# Patient Record
Sex: Female | Born: 1990 | Race: Black or African American | Hispanic: No | Marital: Single | Smoking: Never smoker
Health system: Southern US, Community
[De-identification: ages and names within clinical notes are randomized; demographics above are authoritative.]

## PROBLEM LIST (undated history)

## (undated) ENCOUNTER — Inpatient Hospital Stay (HOSPITAL_COMMUNITY): Payer: Self-pay

## (undated) DIAGNOSIS — I10 Essential (primary) hypertension: Secondary | ICD-10-CM

## (undated) DIAGNOSIS — K819 Cholecystitis, unspecified: Secondary | ICD-10-CM

## (undated) DIAGNOSIS — J45909 Unspecified asthma, uncomplicated: Secondary | ICD-10-CM

## (undated) DIAGNOSIS — A749 Chlamydial infection, unspecified: Secondary | ICD-10-CM

## (undated) DIAGNOSIS — A6000 Herpesviral infection of urogenital system, unspecified: Secondary | ICD-10-CM

## (undated) HISTORY — PX: NO PAST SURGERIES: SHX2092

---
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
## 2012-07-07 ENCOUNTER — Emergency Department (HOSPITAL_COMMUNITY)
Admission: EM | Admit: 2012-07-07 | Discharge: 2012-07-07 | Disposition: A | Discharge status: Home / Self Care | Payer: Self-pay | Attending: Emergency Medicine | Admitting: Emergency Medicine

## 2012-07-07 ENCOUNTER — Encounter (HOSPITAL_COMMUNITY): Payer: Self-pay | Admitting: Emergency Medicine

## 2012-07-07 DIAGNOSIS — Z3202 Encounter for pregnancy test, result negative: Secondary | ICD-10-CM | POA: Insufficient documentation

## 2012-07-07 DIAGNOSIS — R109 Unspecified abdominal pain: Secondary | ICD-10-CM | POA: Insufficient documentation

## 2012-07-07 DIAGNOSIS — J45909 Unspecified asthma, uncomplicated: Secondary | ICD-10-CM | POA: Insufficient documentation

## 2012-07-07 DIAGNOSIS — R1031 Right lower quadrant pain: Secondary | ICD-10-CM

## 2012-07-07 DIAGNOSIS — Z8619 Personal history of other infectious and parasitic diseases: Secondary | ICD-10-CM | POA: Insufficient documentation

## 2012-07-07 HISTORY — DX: Unspecified asthma, uncomplicated: J45.909

## 2012-07-07 LAB — COMPREHENSIVE METABOLIC PANEL
ALT: 14 U/L (ref 0–35)
AST: 19 U/L (ref 0–37)
Albumin: 3.6 g/dL (ref 3.5–5.2)
CO2: 26 mEq/L (ref 19–32)
Calcium: 8.9 mg/dL (ref 8.4–10.5)
Sodium: 142 mEq/L (ref 135–145)
Total Protein: 6.8 g/dL (ref 6.0–8.3)

## 2012-07-07 LAB — CBC WITH DIFFERENTIAL/PLATELET
Basophils Absolute: 0 10*3/uL (ref 0.0–0.1)
Basophils Relative: 0 % (ref 0–1)
Eosinophils Relative: 0 % (ref 0–5)
Lymphocytes Relative: 27 % (ref 12–46)
MCV: 87.3 fL (ref 78.0–100.0)
Platelets: 150 10*3/uL (ref 150–400)
RDW: 12.8 % (ref 11.5–15.5)
WBC: 10.2 10*3/uL (ref 4.0–10.5)

## 2012-07-07 LAB — URINALYSIS, ROUTINE W REFLEX MICROSCOPIC
Hgb urine dipstick: NEGATIVE
Protein, ur: NEGATIVE mg/dL
Urobilinogen, UA: 1 mg/dL (ref 0.0–1.0)

## 2012-07-07 LAB — URINE MICROSCOPIC-ADD ON

## 2012-07-07 LAB — WET PREP, GENITAL

## 2012-07-07 NOTE — ED Provider Notes (Signed)
History     CSN: [id number]  Arrival date & time 07/07/12  7829   First MD Initiated Contact with Patient 07/07/12 0435      Chief Complaint  Patient presents with  . Abdominal Pain    (Consider location/radiation/quality/duration/timing/severity/associated sxs/prior treatment) HPI Comments: Patient is a 22 year old female who presents for lower abdominal cramping x2 weeks that is intermittent in nature and non-radiating. Patient denies area or alleviating factors without any specific events times with onset of this abdominal cramping. Patient denies fevers, chest pain, shortness of breath, nausea vomiting diarrhea, dysuria, hematuria, hematochezia, melena, vaginal discharge or bleeding, dyspareunia, and numbness or tingling in her extremities. Patient admits to testing positive and being treated for chlamydia in 04/2012. Patient states LMP was in December of 2013; admits to irregular menses.  The history is provided by the patient. No language interpreter was used.    Past Medical History  Diagnosis Date  . Asthma     History reviewed. No pertinent past surgical history.  No family history on file.  History  Substance Use Topics  . Smoking status: Never Smoker   . Smokeless tobacco: Not on file  . Alcohol Use: No    OB History   Grav Para Term Preterm Abortions TAB SAB Ect Mult Living                  Review of Systems  Constitutional: Negative for fever and chills.  HENT: Negative for neck pain and neck stiffness.   Respiratory: Negative for shortness of breath.   Cardiovascular: Negative for chest pain.  Gastrointestinal: Positive for abdominal pain. Negative for nausea, vomiting and diarrhea.  Genitourinary: Negative for dysuria, hematuria, vaginal bleeding, vaginal discharge and vaginal pain.  Neurological: Negative for dizziness, syncope, weakness, light-headedness and numbness.  All other systems reviewed and are negative.    Allergies  Review of patient's  allergies indicates no known allergies.  Home Medications  No current outpatient prescriptions on file.  BP 132/87  Pulse 77  Temp(Src) 98.1 F (36.7 C) (Oral)  Resp 16  SpO2 99%  LMP 04/12/2012  Physical Exam  Nursing note and vitals reviewed. Constitutional: She is oriented to person, place, and time. No distress.  Morbidly obese  HENT:  Head: Normocephalic and atraumatic.  Right Ear: External ear normal.  Left Ear: External ear normal.  Mouth/Throat: Oropharynx is clear and moist. No oropharyngeal exudate.  Eyes: Conjunctivae are normal. Pupils are equal, round, and reactive to light. No scleral icterus.  Cardiovascular: Normal rate, regular rhythm, normal heart sounds and intact distal pulses.   Pulmonary/Chest: Effort normal and breath sounds normal. No respiratory distress. She has no wheezes. She has no rales.  Abdominal: Soft. Bowel sounds are normal. She exhibits no distension. There is tenderness. There is no rebound.  Minimal tenderness on deep palpation that is generalized. No peritoneal signs.  Genitourinary: Vagina normal. There is no rash, tenderness, lesion or injury on the right labia. There is no rash, tenderness, lesion or injury on the left labia. Cervix exhibits friability. Cervix exhibits no motion tenderness. Right adnexum displays no mass, no tenderness and no fullness. Left adnexum displays no mass, no tenderness and no fullness. No erythema, tenderness or bleeding around the vagina.  Clear d/c appreciated from cervix. No CMT or adnexal tenderness.  Musculoskeletal: Normal range of motion.  Neurological: She is alert and oriented to person, place, and time.  Skin: Skin is warm and dry. No rash noted. She is not diaphoretic. No  erythema.  Psychiatric: She has a normal mood and affect. Her behavior is normal.    ED Course  Procedures (including critical care time)  Labs Reviewed  URINALYSIS, ROUTINE W REFLEX MICROSCOPIC - Abnormal; Notable for the  following:    Color, Urine AMBER (*)    APPearance CLOUDY (*)    Specific Gravity, Urine 1.034 (*)    Ketones, ur 15 (*)    Leukocytes, UA TRACE (*)    All other components within normal limits  COMPREHENSIVE METABOLIC PANEL - Abnormal; Notable for the following:    Glucose, Bld 102 (*)    All other components within normal limits  URINE MICROSCOPIC-ADD ON - Abnormal; Notable for the following:    Squamous Epithelial / LPF MANY (*)    Bacteria, UA MANY (*)    All other components within normal limits  URINE CULTURE  CBC WITH DIFFERENTIAL  POCT PREGNANCY, URINE   No results found.   1. B/l lower abdominal cramping    MDM  Patient presents for lower abdominal cramping x 2 weeks. No evidence of acute abdomen on physical exam. Basic labs unremarkable, urine pregnancy negative, and UA findings consistent with contamination; trace leukocytes without nitrites or urinary symptoms makes UTI unlikely at this time. Pelvic exam significant for cervical friability and clear d/c from the cervix without CMT or adnexal tenderness. GC/Chlamydia pending.   Pelvic U/S done at bedside with Dr. Rulon Abide which showed possible cysts on the L ovary. Patient advised to have complete pelvic U/S completed, but states she wants to leave because she has work in the AM. Patient well and nontoxic appearing with stable vital signs; stable for d/c with OBGYN follow up and scheduling of pelvic U/S as an outpatient. Resource guide provided and patient instructed to f/u regarding results of GC/Chlamydia test. Advised to refrain from sexual activity until results followed up on. Patient verbalizes comfort and understanding with this d/c plan with no unaddressed concerns. Patient work up and management discussed with Dr. Rulon Abide who is in agreement.        Antony Madura, PA-C 07/08/12 1218

## 2012-07-07 NOTE — ED Notes (Signed)
PT. REPORTS MID ABDOMINAL PAIN WITH NAUSEA FOR SEVERAL DAYS , DENIES VOMITTING OR DIARRHEA , NO FEVER OR CHILLS . DENIES VAGINAL DISCHARGE / DYSURIA.

## 2012-07-08 LAB — URINE CULTURE

## 2012-07-08 LAB — GC/CHLAMYDIA PROBE AMP: CT Probe RNA: POSITIVE — AB

## 2012-07-08 NOTE — ED Provider Notes (Signed)
Medical screening examination/treatment/procedure(s) were performed by non-physician practitioner and as supervising physician I was immediately available for consultation/collaboration.  Jones Skene, M.D.     Jones Skene, MD 07/08/12 1328

## 2012-07-09 ENCOUNTER — Telehealth (HOSPITAL_COMMUNITY): Payer: Self-pay | Admitting: Emergency Medicine

## 2012-07-09 NOTE — ED Notes (Signed)
Results received from [facility].  (+) Chlamydia.  No antibiotic treatment or Prescription given for STD.  Chart to MD office for review.  DHHS form attached. 

## 2012-07-11 ENCOUNTER — Telehealth (HOSPITAL_COMMUNITY): Payer: Self-pay | Admitting: Emergency Medicine

## 2012-07-11 ENCOUNTER — Ambulatory Visit (HOSPITAL_COMMUNITY): Payer: Self-pay

## 2012-07-11 NOTE — ED Notes (Signed)
Spoke with pt. Verified ID by SS#. Pt informed of lab results. Advised to notify partner for testing and treatment. Rolan Bucco PA ordered Azithromycin 1 gram po x 1 with no refills. This was called to [facility] on [address]. Left on voicemail.

## 2012-07-15 ENCOUNTER — Telehealth (HOSPITAL_COMMUNITY): Payer: Self-pay | Admitting: Emergency Medicine

## 2012-07-15 NOTE — ED Notes (Signed)
Pt at [facility] for tx.  [facility] did not receive DHHS form for pt.  Will complete DHHS form and fax to [facility]

## 2013-01-05 ENCOUNTER — Emergency Department (HOSPITAL_COMMUNITY)
Admission: EM | Admit: 2013-01-05 | Discharge: 2013-01-05 | Disposition: A | Discharge status: Home / Self Care | Payer: Self-pay | Attending: Emergency Medicine | Admitting: Emergency Medicine

## 2013-01-05 ENCOUNTER — Encounter (HOSPITAL_COMMUNITY): Payer: Self-pay | Admitting: Emergency Medicine

## 2013-01-05 DIAGNOSIS — M549 Dorsalgia, unspecified: Secondary | ICD-10-CM

## 2013-01-05 DIAGNOSIS — Z3202 Encounter for pregnancy test, result negative: Secondary | ICD-10-CM | POA: Insufficient documentation

## 2013-01-05 DIAGNOSIS — IMO0001 Reserved for inherently not codable concepts without codable children: Secondary | ICD-10-CM | POA: Insufficient documentation

## 2013-01-05 DIAGNOSIS — M545 Low back pain, unspecified: Secondary | ICD-10-CM | POA: Insufficient documentation

## 2013-01-05 DIAGNOSIS — J45909 Unspecified asthma, uncomplicated: Secondary | ICD-10-CM | POA: Insufficient documentation

## 2013-01-05 LAB — URINALYSIS, ROUTINE W REFLEX MICROSCOPIC
Ketones, ur: NEGATIVE mg/dL
Protein, ur: NEGATIVE mg/dL
Urobilinogen, UA: 1 mg/dL (ref 0.0–1.0)

## 2013-01-05 LAB — PREGNANCY, URINE: Preg Test, Ur: NEGATIVE

## 2013-01-05 MED ORDER — ACETAMINOPHEN 325 MG PO TABS
650.0000 mg | ORAL_TABLET | Freq: Once | ORAL | Status: AC
  Administered 2013-01-05: 650 mg via ORAL
  Filled 2013-01-05: qty 2

## 2013-01-05 MED ORDER — IBUPROFEN 400 MG PO TABS
600.0000 mg | ORAL_TABLET | Freq: Once | ORAL | Status: AC
  Administered 2013-01-05: 600 mg via ORAL
  Filled 2013-01-05 (×2): qty 1

## 2013-01-05 MED ORDER — CYCLOBENZAPRINE HCL 5 MG PO TABS: 5.0000 mg | ORAL_TABLET | Freq: Three times a day (TID) | ORAL | Status: DC | PRN

## 2013-01-05 NOTE — ED Provider Notes (Signed)
CSN: [id number]     Arrival date & time 01/05/13  1531 History  This chart was scribed for non-physician practitioner, Coral Ceo, PA-C working with No att. providers found by Greggory Stallion, ED scribe. This patient was seen in room TR09C/TR09C and the patient's care was started at 4:53 PM.   Chief Complaint  Patient presents with  . Back Pain   The history is provided by the patient. No language interpreter was used.    HPI Comments: Bailey Carney is a 22 y.o. female who presents to the Emergency Department complaining of mid to lower back pain that radiates to her upper left leg that started this morning after she woke up. Pt denies injury or fall. Pt states she was just packing things into boxes yesterday. She states she has never injured or had surgeries on her back before. Certain movements worsen the pain. Pt denies knee pain, calf pain, fever, chills, rhinorrhea, sore throat, cough, SOB, CP, abdominal pain, nausea, emesis, dysuria, vaginal discharge, vaginal bleeding, numbness, tinging, weakness, bowel and bladder incontinence. LNMP was one week ago.  Past Medical History  Diagnosis Date  . Asthma    History reviewed. No pertinent past surgical history. No family history on file. History  Substance Use Topics  . Smoking status: Never Smoker   . Smokeless tobacco: Not on file  . Alcohol Use: No   OB History   Grav Para Term Preterm Abortions TAB SAB Ect Mult Living                 Review of Systems  Constitutional: Negative for fever, chills, diaphoresis, activity change, appetite change and fatigue.  HENT: Negative for ear pain, congestion, sore throat, rhinorrhea, neck pain and neck stiffness.   Respiratory: Negative for cough and shortness of breath.   Cardiovascular: Negative for chest pain.  Gastrointestinal: Negative for nausea, vomiting and abdominal pain.  Genitourinary: Negative for dysuria, hematuria, vaginal bleeding and vaginal discharge.       Denies bowel  and bladder incontinence.   Musculoskeletal: Positive for myalgias and back pain. Negative for joint swelling, arthralgias and gait problem.  Skin: Negative for wound.  Neurological: Negative for weakness, numbness and headaches.  Psychiatric/Behavioral: Negative for confusion.  All other systems reviewed and are negative.    Allergies  Review of patient's allergies indicates no known allergies.  Home Medications  No current outpatient prescriptions on file.  BP 135/66  Pulse 72  Temp(Src) 98.1 F (36.7 C) (Oral)  Resp 18  Ht 5\' 7"  (1.702 m)  SpO2 100%  LMP 12/29/2012  Filed Vitals:   01/05/13 1539 01/05/13 1823  BP: 135/66 126/88  Pulse: 72 59  Temp: 98.1 F (36.7 C) 97 F (36.1 C)  TempSrc: Oral   Resp: 18   Height: 5\' 7"  (1.702 m)   SpO2: 100% 100%    Physical Exam  Nursing note and vitals reviewed. Constitutional: She is oriented to person, place, and time. She appears well-developed and well-nourished. No distress.  HENT:  Head: Normocephalic and atraumatic.  Right Ear: External ear normal.  Left Ear: External ear normal.  Nose: Nose normal.  Eyes: Conjunctivae and EOM are normal. Right eye exhibits no discharge. Left eye exhibits no discharge.  Neck: Normal range of motion. Neck supple. No tracheal deviation present.  No cervical spinal tenderness.    Cardiovascular: Normal rate, regular rhythm and normal heart sounds.   Pulmonary/Chest: Effort normal and breath sounds normal. No respiratory distress. She has no wheezes.  She has no rales.  Abdominal: Soft. Bowel sounds are normal. There is no tenderness.  Musculoskeletal: Normal range of motion. She exhibits tenderness. She exhibits no edema.  No swelling, erythema, ecchymosis or wounds to lower extremities throughout. No tenderness to palpation to lower extremities throughout. Good flexion to knees bilaterally. Gross sensation intact to lower extremities. Tenderness to palpation to lower lumbar and para  spinal muscles. No bony tenderness.   Neurological: She is alert and oriented to person, place, and time.  Gross sensation intact throughou  Skin: Skin is warm and dry. She is not diaphoretic.  Psychiatric: She has a normal mood and affect. Her behavior is normal.    ED Course  Procedures (including critical care time)  DIAGNOSTIC STUDIES: Oxygen Saturation is 100% on RA, normal by my interpretation.    COORDINATION OF CARE: 5:02 PM-Discussed treatment plan which includes UA with pt at bedside and pt agreed to plan.   6:13 PM-Pt was resting comfortably and walking around the room. She was able to get on and off the table comfortably. Discussed discharge with her and she agrees.   Labs Review Labs Reviewed  URINALYSIS, ROUTINE W REFLEX MICROSCOPIC - Abnormal; Notable for the following:    Leukocytes, UA TRACE (*)    All other components within normal limits  URINE MICROSCOPIC-ADD ON - Abnormal; Notable for the following:    Squamous Epithelial / LPF FEW (*)    Bacteria, UA FEW (*)    All other components within normal limits  PREGNANCY, URINE   Imaging Review No results found.  Results for orders placed during the hospital encounter of 01/05/13  URINALYSIS, ROUTINE W REFLEX MICROSCOPIC      Result Value Range   Color, Urine YELLOW  YELLOW   APPearance CLEAR  CLEAR   Specific Gravity, Urine 1.019  1.005 - 1.030   pH 7.5  5.0 - 8.0   Glucose, UA NEGATIVE  NEGATIVE mg/dL   Hgb urine dipstick NEGATIVE  NEGATIVE   Bilirubin Urine NEGATIVE  NEGATIVE   Ketones, ur NEGATIVE  NEGATIVE mg/dL   Protein, ur NEGATIVE  NEGATIVE mg/dL   Urobilinogen, UA 1.0  0.0 - 1.0 mg/dL   Nitrite NEGATIVE  NEGATIVE   Leukocytes, UA TRACE (*) NEGATIVE  PREGNANCY, URINE      Result Value Range   Preg Test, Ur NEGATIVE  NEGATIVE  URINE MICROSCOPIC-ADD ON      Result Value Range   Squamous Epithelial / LPF FEW (*) RARE   WBC, UA 0-2  <3 WBC/hpf   Bacteria, UA FEW (*) RARE     MDM  No  diagnosis found.   Bailey Carney is a 22 y.o. female Bailey Carney is a 22 y.o. female who presents to the Emergency Department complaining of mid to lower back pain.  UA and urine pregnancy ordered to further evaluate.  Ibuprofen and Tylenol ordered for symptomatic relief.      Etiology of back pain is likely due to a muscular strain.  UA not indicative for UTI.  No hx of trauma or injury.  Patint was neurovascularly intact.  Patient was prescribed flexeril for outpatient management.  Patient was instructed to return to the ED if they experience any loss of bowel/bladder, weakness, fever or other concerns.  Patient instructed to follow-up with a PCP for further evaluation and management.  Patient was in agreement with discharge and plan.     Final impressions: 1. Back pain     Greer Ee  Isabelle Course   I personally performed the services described in this documentation, which was scribed in my presence. The recorded information has been reviewed and is accurate.     Jillyn Ledger, PA-C 01/06/13 2217

## 2013-01-05 NOTE — ED Notes (Signed)
Pt. woke up this morning with mid/low back pain radiating to left leg , denies injury or fall . Ambulatory . Denies urinary discomfort .

## 2013-01-05 NOTE — ED Notes (Signed)
I gave the patient a warm blanket. 

## 2013-01-08 NOTE — ED Provider Notes (Signed)
Medical screening examination/treatment/procedure(s) were performed by non-physician practitioner and as supervising physician I was immediately available for consultation/collaboration.  Swan Zayed, MD 01/08/13 0634 

## 2013-08-03 ENCOUNTER — Emergency Department (HOSPITAL_COMMUNITY)
Admission: EM | Admit: 2013-08-03 | Discharge: 2013-08-03 | Disposition: A | Discharge status: Home / Self Care | Payer: BC Managed Care – PPO | Attending: Emergency Medicine | Admitting: Emergency Medicine

## 2013-08-03 ENCOUNTER — Encounter (HOSPITAL_COMMUNITY): Payer: Self-pay | Admitting: Emergency Medicine

## 2013-08-03 ENCOUNTER — Emergency Department (HOSPITAL_COMMUNITY): Payer: BC Managed Care – PPO

## 2013-08-03 DIAGNOSIS — R52 Pain, unspecified: Secondary | ICD-10-CM | POA: Insufficient documentation

## 2013-08-03 DIAGNOSIS — J069 Acute upper respiratory infection, unspecified: Secondary | ICD-10-CM

## 2013-08-03 DIAGNOSIS — R0789 Other chest pain: Secondary | ICD-10-CM | POA: Insufficient documentation

## 2013-08-03 DIAGNOSIS — R51 Headache: Secondary | ICD-10-CM | POA: Insufficient documentation

## 2013-08-03 DIAGNOSIS — J45901 Unspecified asthma with (acute) exacerbation: Secondary | ICD-10-CM | POA: Insufficient documentation

## 2013-08-03 DIAGNOSIS — B9789 Other viral agents as the cause of diseases classified elsewhere: Secondary | ICD-10-CM

## 2013-08-03 MED ORDER — CETIRIZINE HCL 10 MG PO TABS: 10.0000 mg | ORAL_TABLET | Freq: Every day | ORAL | Status: DC

## 2013-08-03 MED ORDER — ALBUTEROL SULFATE HFA 108 (90 BASE) MCG/ACT IN AERS
2.0000 | INHALATION_SPRAY | Freq: Once | RESPIRATORY_TRACT | Status: AC
  Administered 2013-08-03: 2 via RESPIRATORY_TRACT
  Filled 2013-08-03: qty 6.7

## 2013-08-03 MED ORDER — GUAIFENESIN ER 600 MG PO TB12: 1200.0000 mg | ORAL_TABLET | Freq: Two times a day (BID) | ORAL | Status: DC

## 2013-08-03 MED ORDER — AEROCHAMBER PLUS W/MASK MISC
1.0000 | Freq: Once | Status: AC
  Administered 2013-08-03: 1
  Filled 2013-08-03 (×3): qty 1

## 2013-08-03 MED ORDER — FLUTICASONE PROPIONATE 50 MCG/ACT NA SUSP: 2.0000 | Freq: Every day | NASAL | Status: DC

## 2013-08-03 MED ORDER — PREDNISONE 20 MG PO TABS
60.0000 mg | ORAL_TABLET | Freq: Once | ORAL | Status: AC
  Administered 2013-08-03: 60 mg via ORAL
  Filled 2013-08-03: qty 3

## 2013-08-03 MED ORDER — ALBUTEROL SULFATE (2.5 MG/3ML) 0.083% IN NEBU
5.0000 mg | INHALATION_SOLUTION | Freq: Once | RESPIRATORY_TRACT | Status: AC
  Administered 2013-08-03: 5 mg via RESPIRATORY_TRACT
  Filled 2013-08-03: qty 6

## 2013-08-03 MED ORDER — PREDNISONE 20 MG PO TABS: 40.0000 mg | ORAL_TABLET | Freq: Every day | ORAL | Status: DC

## 2013-08-03 NOTE — Discharge Instructions (Signed)
1. Medications: flonase, mucinex, albuterol, prednisone, zyrtec, usual home medications 2. Treatment: rest, drink plenty of fluids, take tylenol or ibuprofen for fever control 3. Follow Up: Please followup with your primary doctor within 1 week for discussion of your diagnoses and further evaluation after today's visit; if you do not have a primary care doctor use the resource guide provided to find one;    Upper Respiratory Infection, Adult An upper respiratory infection (URI) is also known as the common cold. It is often caused by a type of germ (virus). Colds are easily spread (contagious). You can pass it to others by kissing, coughing, sneezing, or drinking out of the same glass. Usually, you get better in 1 or 2 weeks.  HOME CARE   Only take medicine as told by your doctor.  Use a warm mist humidifier or breathe in steam from a hot shower.  Drink enough water and fluids to keep your pee (urine) clear or pale yellow.  Get plenty of rest.  Return to work when your temperature is back to normal or as told by your doctor. You may use a face mask and wash your hands to stop your cold from spreading. GET HELP RIGHT AWAY IF:   After the first few days, you feel you are getting worse.  You have questions about your medicine.  You have chills, shortness of breath, or brown or red spit (mucus).  You have yellow or brown snot (nasal discharge) or pain in the face, especially when you bend forward.  You have a fever, puffy (swollen) neck, pain when you swallow, or white spots in the back of your throat.  You have a bad headache, ear pain, sinus pain, or chest pain.  You have a high-pitched whistling sound when you breathe in and out (wheezing).  You have a lasting cough or cough up blood.  You have sore muscles or a stiff neck. MAKE SURE YOU:   Understand these instructions.  Will watch your condition.  Will get help right away if you are not doing well or get worse. Document  Released: 09/19/2007 Document Revised: 06/25/2011 Document Reviewed: 08/07/2010 [facility] Patient Information 2014 [place], [place].   Asthma, Adult Asthma is a recurring condition in which the airways tighten and narrow. Asthma can make it difficult to breathe. It can cause coughing, wheezing, and shortness of breath. Asthma episodes (also called asthma attacks) range from minor to life-threatening. Asthma cannot be cured, but medicines and lifestyle changes can help control it. CAUSES Asthma is believed to be caused by inherited (genetic) and environmental factors, but its exact cause is unknown. Asthma may be triggered by allergens, lung infections, or irritants in the air. Asthma triggers are different for each person. Common triggers include:   Animal dander.  Dust mites.  Cockroaches.  Pollen from trees or grass.  Mold.  Smoke.  Air pollutants such as dust, household cleaners, hair sprays, aerosol sprays, paint fumes, strong chemicals, or strong odors.  Cold air, weather changes, and winds (which increase molds and pollens in the air).  Strong emotional expressions such as crying or laughing hard.  Stress.  Certain medicines (such as aspirin) or types of drugs (such as beta-blockers).  Sulfites in foods and drinks. Foods and drinks that may contain sulfites include dried fruit, potato chips, and sparkling grape juice.  Infections or inflammatory conditions such as the flu, a cold, or an inflammation of the nasal membranes (rhinitis).  Gastroesophageal reflux disease (GERD).  Exercise or strenuous activity. SYMPTOMS  Symptoms may occur immediately after asthma is triggered or many hours later. Symptoms include:  Wheezing.  Excessive nighttime or early morning coughing.  Frequent or severe coughing with a common cold.  Chest tightness.  Shortness of breath. DIAGNOSIS  The diagnosis of asthma is made by a review of your medical history and a physical exam. Tests may  also be performed. These may include:  Lung function studies. These tests show how much air you breath in and out.  Allergy tests.  Imaging tests such as X-rays. TREATMENT  Asthma cannot be cured, but it can usually be controlled. Treatment involves identifying and avoiding your asthma triggers. It also involves medicines. There are 2 classes of medicine used for asthma treatment:   Controller medicines. These prevent asthma symptoms from occurring. They are usually taken every day.  Reliever or rescue medicines. These quickly relieve asthma symptoms. They are used as needed and provide short-term relief. Your health care provider will help you create an asthma action plan. An asthma action plan is a written plan for managing and treating your asthma attacks. It includes a list of your asthma triggers and how they may be avoided. It also includes information on when medicines should be taken and when their dosage should be changed. An action plan may also involve the use of a device called a peak flow meter. A peak flow meter measures how well the lungs are working. It helps you monitor your condition. HOME CARE INSTRUCTIONS   Take medicine as directed by your health care provider. Speak with your health care provider if you have questions about how or when to take the medicines.  Use a peak flow meter as directed by your health care provider. Record and keep track of readings.  Understand and use the action plan to help minimize or stop an asthma attack without needing to seek medical care.  Control your home environment in the following ways to help prevent asthma attacks:  Do not smoke. Avoid being exposed to secondhand smoke.  Change your heating and air conditioning filter regularly.  Limit your use of fireplaces and wood stoves.  Get rid of pests (such as roaches and mice) and their droppings.  Throw away plants if you see mold on them.  Clean your floors and dust regularly.  Use unscented cleaning products.  Try to have someone else vacuum for you regularly. Stay out of rooms while they are being vacuumed and for a short while afterward. If you vacuum, use a dust mask from a hardware store, a double-layered or microfilter vacuum cleaner bag, or a vacuum cleaner with a HEPA filter.  Replace carpet with wood, tile, or vinyl flooring. Carpet can trap dander and dust.  Use allergy-proof pillows, mattress covers, and box spring covers.  Wash bed sheets and blankets every week in hot water and dry them in a dryer.  Use blankets that are made of polyester or cotton.  Clean bathrooms and kitchens with bleach. If possible, have someone repaint the walls in these rooms with mold-resistant paint. Keep out of the rooms that are being cleaned and painted.  Wash hands frequently. SEEK MEDICAL CARE IF:   You have wheezing, shortness of breath, or a cough even if taking medicine to prevent attacks.  The colored mucus you cough up (sputum) is thicker than usual.  Your sputum changes from clear or white to yellow, green, gray, or bloody.  You have any problems that may be related to the medicines you are  taking (such as a rash, itching, swelling, or trouble breathing).  You are using a reliever medicine more than 2 3 times per week.  Your peak flow is still at 50 79% of you personal best after following your action plan for 1 hour. SEEK IMMEDIATE MEDICAL CARE IF:   You seem to be getting worse and are unresponsive to treatment during an asthma attack.  You are short of breath even at rest.  You get short of breath when doing very little physical activity.  You have difficulty eating, drinking, or talking due to asthma symptoms.  You develop chest pain.  You develop a fast heartbeat.  You have a bluish color to your lips or fingernails.  You are lightheaded, dizzy, or faint.  Your peak flow is less than 50% of your personal best.  You have a fever or  persistent symptoms for more than 2 3 days.  You have a fever and symptoms suddenly get worse. MAKE SURE YOU:   Understand these instructions.  Will watch your condition.  Will get help right away if you are not doing well or get worse. Document Released: 04/02/2005 Document Revised: 12/03/2012 Document Reviewed: 10/30/2012 [facility] Patient Information 2014 [place], [place].    Emergency Department Resource Guide 1) Find a Doctor and Pay Out of Pocket Although you won't have to find out who is covered by your insurance plan, it is a good idea to ask around and get recommendations. You will then need to call the office and see if the doctor you have chosen will accept you as a new patient and what types of options they offer for patients who are self-pay. Some doctors offer discounts or will set up payment plans for their patients who do not have insurance, but you will need to ask so you aren't surprised when you get to your appointment.  2) Contact Your Local Health Department Not all health departments have doctors that can see patients for sick visits, but many do, so it is worth a call to see if yours does. If you don't know where your local health department is, you can check in your phone book. The CDC also has a tool to help you locate your state's health department, and many state websites also have listings of all of their local health departments.  3) Find a Walk-in Clinic If your illness is not likely to be very severe or complicated, you may want to try a walk in clinic. These are popping up all over the country in pharmacies, drugstores, and shopping centers. They're usually staffed by nurse practitioners or physician assistants that have been trained to treat common illnesses and complaints. They're usually fairly quick and inexpensive. However, if you have serious medical issues or chronic medical problems, these are probably not your best option.  No Primary Care  Doctor: - Call Health Connect at  [phone] - they can help you locate a primary care doctor that  accepts your insurance, provides certain services, etc. - Physician Referral Service- [phone]  Chronic Pain Problems: Organization         Address  Phone   Notes  [facility]  [phone] Patients need to be referred by their primary care doctor.   Medication Assistance: Organization         Address  Phone   Notes  [facility] Medication [facility] [address] [place] [place], [place] [address] [phone] --Must be a resident of [facility] --  Must have NO insurance coverage whatsoever (no Medicaid/ Medicare, etc.) -- The pt. MUST have a primary care doctor that directs their care regularly and follows them in the community   [facility]  [phone]   [employer]  [phone]    Agencies that provide inexpensive medical care: Organization         Address  Phone   Notes  [facility]  [phone]   [facility]    [phone]   [facility] [address] [place], [place] [address] [phone]   [facility] [address] [place], [place] [phone]   Planned Parenthood    [phone]   [facility]    [phone]   [facility]  [address], [place] Phone:  [phone], Fax:  [phone] Hours of Operation:  9 am - 6 pm, M-F.  Also accepts Medicaid/Medicare and self-pay.  [facility]  [address], [place] Phone: [phone], Fax: [phone]. Hours of Operation:  8:30 am - 5:30 pm, M-F.  Also accepts Medicaid and self-pay.  [facility] [place] [address] [place] Phone: [phone]   [facility] [address] Natasha Bence [place], [place] [phone], Ext. [phone] Mondays & Thursdays: 7-9 AM.  First 15 patients are seen on a first  come, first serve basis.    Medicaid-accepting [facility] Providers:  Organization         Address  Phone   Notes  [facility] [address], [place] [phone] Also accepts self-pay patients.  [facility] [address] Laurell Josephs [place], [place]  [phone]   [facility] [address], [place] [phone]   [facility] [address], [place] [phone]   Renaye Rakers [address], [place]   [phone] Only accepts [place] patients after they have their name applied to their card.   Self-Pay (no insurance) in [facility]:  Organization         Address  Phone   Notes  Sickle Cell Patients, [facility] [address] [place], [place] [phone]   [facility] [address] [place], [place] [phone]   [facility] [place]  [address], [place] [phone]   [facility]/Dr. Osei-Bonsu  [address], [place] or [address], [place] [phone] Phone number for both [place] and [place] locations is the same.  [facility] [place] [address], [place] [phone]   [facility] [address] [place] or [address] [phone]   [facility] [address] [place] [phone], phone; [phone], fax Sees patients 1st and 3rd Saturday of every month.  Must not qualify for public or private insurance (i.e. Medicaid, Medicare, [place] Health Choice, Veterans' Benefits)  Household income should be no more than 200% of the poverty level The clinic cannot treat you if you are pregnant or think you are pregnant  Sexually transmitted diseases are not treated at the clinic.    Dental Care: Organization          Address  Phone  Notes  [facility] of [facility] [address] [place], [place] [phone] Accepts children up to age 6 who are enrolled in [place] or [place] Health Choice; pregnant women with a Medicaid card; and children who have applied for Medicaid or [place] Health Choice, but were declined, whose parents can pay a reduced fee at time of service.  [facility] Department of [facility]  [address], [place] [phone] Accepts children up to age 69 who are enrolled in [place] or  Health Choice; pregnant women with a Medicaid card; and children who have applied for Medicaid or  Health Choice, but were declined, whose parents can pay a reduced fee at time of service.  [facility]  [address] [place], [place] [phone] Patients are seen by appointment only. Walk-ins are not accepted. [facility] will see patients 97 years of age and older. Monday - Tuesday (8am-5pm) Most Wednesdays (8:30-5pm) $30 per visit, cash only  [facility] Adult Dental Access PRO[facility]  [address], [facility] [phone] Patients are seen by appointment only. Walk-ins are not accepted. [facility] will see patients 82 years of age and older. One Wednesday Evening (Monthly: Volunteer Based).  $30 per visit, cash only  [employer]  [phone] for adults; Children under age 31, call Graduate Pediatric Dentistry at [phone]. Children aged 41-14, please call [phone] to request a pediatric application.  Dental services are provided in all areas of dental care including fillings, crowns and bridges, complete and partial dentures, implants, gum treatment, root canals, and extractions. Preventive care is also provided. Treatment is provided to both adults and children. Patients are selected via a lottery and there is often a waiting list.   [facility] [address] [place]  [phone] [email or web address]   [facility] [address] [place], [place] [phone], Ext. [phone] Second and Fourth Thursday of each month, opens at 6:30 AM; Clinic ends at 9 AM.  Patients are seen on a first-come first-served basis, and a limited number are seen during each clinic.   [facility]  [address] Ether Griffins [place], [place] [phone]   Eligibility Requirements You must have lived in [place], [place], or [place] counties for at least the last three months.   You cannot be eligible for state or federal sponsored [place], including [employer], [place], or [employer].   You generally cannot be eligible for healthcare insurance through your employer.    How to apply: Eligibility screenings are held every Tuesday and Wednesday afternoon from 1:00 pm until 4:00 pm. You do not need an appointment for the interview!  [facility] [address], [place], [place] [phone]   [facility] Health Department  [phone]   [facility] Health Department  [phone]   [facility] Health Department  [phone]    Behavioral Health Resources in the Community: Intensive Outpatient Programs Organization         Address  Phone  Notes  [facility] [address] [place], [place] [phone]   [facility] Outpatient [address], [place], [place] [phone]   [facility] [address] [place], [place]  [phone]   [facility] [address],  [place], [place] [phone] or [phone]   Substance Abuse Resources Organization  Address  Phone  Notes  [facility]  [phone]   [facility]  [phone]   [facility]  [phone]   [facility]  [phone]   Residential & Outpatient Substance Abuse Program  [phone]   Psychological  Services Organization         Address  Phone  Notes  [facility]  [phone]- [phone]   [facility]  [phone]- [phone]   [facility] [address], [place] [phone] or [phone]    Mobile Crisis Teams Organization         Address  Phone  Notes  [facility], Mobile Crisis Care Unit  [phone]   [facility]  [address] [place], [place] [phone]   Doristine LocksSharon DeEsch [address] [place] [phone]    Self-Help/Support Groups Organization         Address  Phone             Notes  [facility] [place] - variety of support groups  336- [phone] Call for more information  Narcotics Anonymous (NA), [facility] [address], [place]  2 meetings at this location   Statisticianesidential Treatment Programs Organization         Address  Phone  Notes  [facility] [address],    [place]  [phone]   [facility]  [address], [place] [phone], [place], [place] [phone]   [facility] [address], [place] [phone] Admissions: 8am-3pm M-F  [facility] [address]    [place], [place] [phone]   [facility] [address], [place], [place] [phone]   [facility] [address]  [place], [place] [phone]   [facility] - Intensive Outpatient [address], [place], [place] [phone]   [facility] ([facility] [address]  [place], [place] [phone] or [phone]   [facility] ([facility]) [address] [place], [place] [phone] Accepts Medicaid  [facility] [address]  [place] [phone] Substance Abuse/Addiction Treatment   [facility] Resources Organization         Address  Phone  Notes  [facility]  [phone])  [phone]   Angie FavaJulie Brannon, PhD [address], [address] [place], [place]   [phone] or [phone]) [phone]   [facility]   [address] [place], [place] [phone](336) [phone]   [facility] [address], [place], [place] [phone]) [phone] Insurance/Medicaid/sponsorship through [facility]  [facility] Families [address]                                    [place], [place] [phone]) [phone] Therapy/tele-psych/case  [facility] [address].   [place], [place] [phone]    Dr. Lolly MustacheArfeen  [phone]   [facility]  [employer] [facility] 1) [address], [place] 2) [address], [place] 3)  [address], [place] [phone]) [phone]) [phone]  [phone]) [phone]   [facility] Child Abuse Hotline [phone]) [phone] or [phone] (After Hours)

## 2013-08-03 NOTE — ED Provider Notes (Signed)
CSN: [id number]     Arrival date & time 08/03/13  1555 History   First MD Initiated Contact with Patient 08/03/13 1647     Chief Complaint  Patient presents with  . Generalized Body Aches  . Nasal Congestion     (Consider location/radiation/quality/duration/timing/severity/associated sxs/prior Treatment) The history is provided by the patient and medical records. No language interpreter was used.    Bailey Carney is a 23 y.o. female  with a hx of asthma presents to the Emergency Department complaining of gradual, persistent, progressively worsening rhinorrhea, nasal congestion, sore throat, cough, SOB, sneezing, low grade fever (measured at < 100 at home) onset 3 days ago.   Pt also reports associated headache, generalized and throbbing without visual disturbances.  Pt reports she has bee around her nieces who are sick.  Pt has taken mucinex x1 without relief. She reports she has no albuterol inhaler because she lost it.  Pt has no PCP or pulmonologist to manage her asthma.  Pt denies hx of diabetes.  No aggravating or alleviating symptoms.  Pt denies chills, neck pain, chest pain, abd pain, N/V/D, weakness, dizziness, syncope, dysuria.      Past Medical History  Diagnosis Date  . Asthma    History reviewed. No pertinent past surgical history. History reviewed. No pertinent family history. History  Substance Use Topics  . Smoking status: Never Smoker   . Smokeless tobacco: Not on file  . Alcohol Use: No   OB History   Grav Para Term Preterm Abortions TAB SAB Ect Mult Living                 Review of Systems  Constitutional: Positive for fatigue. Negative for fever, chills and appetite change.  HENT: Positive for congestion, postnasal drip, rhinorrhea, sinus pressure and sore throat. Negative for ear discharge, ear pain and mouth sores.   Eyes: Negative for visual disturbance.  Respiratory: Positive for cough, chest tightness, shortness of breath and wheezing. Negative for  stridor.   Cardiovascular: Negative for chest pain, palpitations and leg swelling.  Gastrointestinal: Negative for nausea, vomiting, abdominal pain and diarrhea.  Genitourinary: Negative for dysuria, urgency, frequency and hematuria.  Musculoskeletal: Negative for arthralgias, back pain, myalgias and neck stiffness.  Skin: Negative for rash.  Neurological: Positive for headaches. Negative for syncope, light-headedness and numbness.  Hematological: Negative for adenopathy.  Psychiatric/Behavioral: The patient is not nervous/anxious.   All other systems reviewed and are negative.     Allergies  Amoxicillin  Home Medications   Prior to Admission medications   Not on File   BP 131/65  Pulse 72  Temp(Src) 99.4 F (37.4 C) (Oral)  Resp 21  Ht 5\' 7"  (1.702 m)  Wt 245 lb (111.131 kg)  BMI 38.36 kg/m2  SpO2 92%  LMP 07/28/2013 Physical Exam  Nursing note and vitals reviewed. Constitutional: She is oriented to person, place, and time. She appears well-developed and well-nourished. No distress.  Awake, alert, nontoxic appearance  HENT:  Head: Normocephalic and atraumatic.  Right Ear: Tympanic membrane, external ear and ear canal normal.  Left Ear: Tympanic membrane, external ear and ear canal normal.  Nose: Mucosal edema and rhinorrhea present. No epistaxis. Right sinus exhibits no maxillary sinus tenderness and no frontal sinus tenderness. Left sinus exhibits no maxillary sinus tenderness and no frontal sinus tenderness.  Mouth/Throat: Uvula is midline, oropharynx is clear and moist and mucous membranes are normal. Mucous membranes are not pale and not cyanotic. No oropharyngeal exudate, posterior oropharyngeal  edema, posterior oropharyngeal erythema or tonsillar abscesses.  Eyes: Conjunctivae are normal. Pupils are equal, round, and reactive to light. No scleral icterus.  Neck: Normal range of motion and full passive range of motion without pain. Neck supple.  Cardiovascular:  Normal rate, regular rhythm, normal heart sounds and intact distal pulses.   Regular rate and rhythm, no tachycardia  Pulmonary/Chest: Effort normal. No accessory muscle usage or stridor. Not tachypneic. No respiratory distress. She has decreased breath sounds. She has no wheezes. She has no rhonchi. She has no rales.  Diminished but clear and equal breath sounds  Abdominal: Soft. Bowel sounds are normal. She exhibits no mass. There is no tenderness. There is no rebound and no guarding.  Musculoskeletal: Normal range of motion. She exhibits no edema.  Lymphadenopathy:    She has no cervical adenopathy.  Neurological: She is alert and oriented to person, place, and time.  Speech is clear and goal oriented Moves extremities without ataxia  Skin: Skin is warm and dry. No rash noted. She is not diaphoretic.  Psychiatric: She has a normal mood and affect.    ED Course  Procedures (including critical care time) Labs Review Labs Reviewed - No data to display  Imaging Review Dg Chest 2 View  08/03/2013   CLINICAL DATA:  Cough, nasal congestion and body aches  EXAM: CHEST  2 VIEW  COMPARISON:  None.  FINDINGS: The lungs are clear and negative for focal airspace consolidation, pulmonary edema or suspicious pulmonary nodule. No pleural effusion or pneumothorax. Cardiac and mediastinal contours are within normal limits. No acute fracture or lytic or blastic osseous lesions. The visualized upper abdominal bowel gas pattern is unremarkable.  IMPRESSION: No active cardiopulmonary disease.   Electronically Signed   By: Malachy MoanHeath  McCullough M.D.   On: 08/03/2013 17:15     EKG Interpretation None      MDM   Final diagnoses:  Viral URI with cough  Asthma exacerbation   [name] presents with HX and PE consistent with URI.  Pt also with hx of asthma and has lost her albuterol inhaler.  Decreased breath sounds on exam without focal wheezes, rhonchi or rales.  CXR without evidence of PNA,  pneumothorax of pulmonary edema.  Will give albuterol and prednisone and reassess.    I personally reviewed the imaging tests through PACS system  I reviewed available ER/hospitalization records through the EMR  6:18 PM Pt tidal volume significantly improved after albuterol and prednisone. Pt now with clear and equal breath sounds.  Patient ambulated in ED with O2 saturations maintained >90, no current signs of respiratory distress. Lung exam improved after nebulizer treatment. Prednisone given in the ED and pt will be dc with 5 day burst. Pt states they are breathing at baseline. Pt has been instructed to continue using prescribed medications and obtain a PCP for follow-up in 1 week for re-evaluation after today's exacerbation. Pt is to return to the ED for worsening SOB no alleviated by her inhaler which was provided today in the ED.    It has been determined that no acute conditions requiring further emergency intervention are present at this time. The patient/guardian have been advised of the diagnosis and plan. We have discussed signs and symptoms that warrant return to the ED, such as changes or worsening in symptoms.   Vital signs are stable at discharge.   BP 131/65  Pulse 72  Temp(Src) 99.4 F (37.4 C) (Oral)  Resp 21  Ht 5\' 7"  (1.702  m)  Wt 245 lb (111.131 kg)  BMI 38.36 kg/m2  SpO2 92%  LMP 07/28/2013  Patient/guardian has voiced understanding and agreed to follow-up with the PCP or specialist.       Dierdre ForthHannah Koty Anctil, PA-C 08/04/13 0230

## 2013-08-03 NOTE — ED Notes (Signed)
Pt with low grade fever, congestion and body aches since Friday.  Pt took mucinex last night.  No meds today.

## 2013-08-04 NOTE — ED Provider Notes (Signed)
Medical screening examination/treatment/procedure(s) were performed by non-physician practitioner and as supervising physician I was immediately available for consultation/collaboration.   EKG Interpretation None        Bailey Carney David WoCandyce Churnfford III, MD 08/04/13 [phone]

## 2013-12-22 ENCOUNTER — Emergency Department (HOSPITAL_COMMUNITY)
Admission: EM | Admit: 2013-12-22 | Discharge: 2013-12-22 | Disposition: A | Discharge status: Home / Self Care | Payer: BC Managed Care – PPO | Attending: Emergency Medicine | Admitting: Emergency Medicine

## 2013-12-22 ENCOUNTER — Encounter (HOSPITAL_COMMUNITY): Payer: Self-pay | Admitting: Emergency Medicine

## 2013-12-22 ENCOUNTER — Emergency Department (HOSPITAL_COMMUNITY): Payer: BC Managed Care – PPO

## 2013-12-22 DIAGNOSIS — R079 Chest pain, unspecified: Secondary | ICD-10-CM | POA: Diagnosis present

## 2013-12-22 DIAGNOSIS — Z880 Allergy status to penicillin: Secondary | ICD-10-CM | POA: Insufficient documentation

## 2013-12-22 DIAGNOSIS — R0789 Other chest pain: Secondary | ICD-10-CM | POA: Diagnosis present

## 2013-12-22 DIAGNOSIS — J45909 Unspecified asthma, uncomplicated: Secondary | ICD-10-CM | POA: Insufficient documentation

## 2013-12-22 LAB — BASIC METABOLIC PANEL
ANION GAP: 11 (ref 5–15)
BUN: 11 mg/dL (ref 6–23)
CO2: 28 meq/L (ref 19–32)
Calcium: 9.2 mg/dL (ref 8.4–10.5)
Chloride: 101 mEq/L (ref 96–112)
Creatinine, Ser: 0.86 mg/dL (ref 0.50–1.10)
GFR calc Af Amer: >90 mL/min (ref >90)
Glucose, Bld: 86 mg/dL (ref 70–99)
POTASSIUM: 4.1 meq/L (ref 3.7–5.3)
SODIUM: 140 meq/L (ref 137–147)

## 2013-12-22 LAB — CBC
HCT: 41.9 % (ref 36.0–46.0)
Hemoglobin: 13.7 g/dL (ref 12.0–15.0)
MCH: 29 pg (ref 26.0–34.0)
MCHC: 32.7 g/dL (ref 30.0–36.0)
MCV: 88.6 fL (ref 78.0–100.0)
PLATELETS: 206 10*3/uL (ref 150–400)
RBC: 4.73 MIL/uL (ref 3.87–5.11)
RDW: 12.9 % (ref 11.5–15.5)
WBC: 10.2 10*3/uL (ref 4.0–10.5)

## 2013-12-22 LAB — I-STAT TROPONIN, ED: TROPONIN I, POC: 0 ng/mL (ref 0.00–0.08)

## 2013-12-22 LAB — PRO B NATRIURETIC PEPTIDE: PRO B NATRI PEPTIDE: 16.9 pg/mL (ref 0–125)

## 2013-12-22 MED ORDER — IBUPROFEN 800 MG PO TABS: 800.0000 mg | ORAL_TABLET | Freq: Three times a day (TID) | ORAL | Status: DC | PRN

## 2013-12-22 MED ORDER — IBUPROFEN 800 MG PO TABS
800.0000 mg | ORAL_TABLET | Freq: Once | ORAL | Status: AC
  Administered 2013-12-22: 800 mg via ORAL
  Filled 2013-12-22: qty 1

## 2013-12-22 NOTE — ED Provider Notes (Signed)
CSN: [id number]     Arrival date & time 12/22/13  1753 History   First MD Initiated Contact with Patient 12/22/13 2029     Chief Complaint  Patient presents with  . Chest Pain    The patient said she started having chest pain after work today.  The patient said she was having SOB earlier but not now.       (Consider location/radiation/quality/duration/timing/severity/associated sxs/prior Treatment) Patient is a 23 y.o. female presenting with chest pain. The history is provided by the patient.  Chest Pain Chest pain location: central and right side. Pain quality: sharp   Pain radiates to:  Does not radiate Pain radiates to the back: no   Pain severity:  Moderate Onset quality:  Gradual Duration:  7 hours Timing:  Intermittent Progression:  Unchanged Chronicity:  New Context comment:  "I was work and then I noticed it" Relieved by:  Nothing Exacerbated by: "pressing on it" Ineffective treatments:  None tried Associated symptoms: no abdominal pain, no altered mental status, no anorexia, no back pain, no claudication, no cough, no diaphoresis, no dizziness, no dysphagia, no fatigue, no fever, no headache, no heartburn, no lower extremity edema, no nausea, no near-syncope, no numbness, no orthopnea, no palpitations, no PND, no shortness of breath, no syncope, not vomiting and no weakness   Associated symptoms comment:  Sore throat Risk factors: no aortic disease, no birth control, no coronary artery disease, no diabetes mellitus, no immobilization, not female, no Marfan's syndrome and not pregnant   Risk factors comment:  Denies estrogen use, long car/airplane rides, or history of VTE   Past Medical History  Diagnosis Date  . Asthma    History reviewed. No pertinent past surgical history. History reviewed. No pertinent family history. History  Substance Use Topics  . Smoking status: Never Smoker   . Smokeless tobacco: Not on file  . Alcohol Use: No   OB History   Grav Para Term  Preterm Abortions TAB SAB Ect Mult Living                 Review of Systems  Constitutional: Negative for fever, diaphoresis and fatigue.  HENT: Negative for trouble swallowing.   Respiratory: Negative for cough and shortness of breath.   Cardiovascular: Positive for chest pain. Negative for palpitations, orthopnea, claudication, syncope, PND and near-syncope.  Gastrointestinal: Negative for heartburn, nausea, vomiting, abdominal pain and anorexia.  Musculoskeletal: Negative for back pain.  Neurological: Negative for dizziness, weakness, numbness and headaches.  All other systems reviewed and are negative.     Allergies  Amoxicillin  Home Medications   Prior to Admission medications   Medication Sig Start Date End Date Taking? Authorizing Provider  ibuprofen (ADVIL,MOTRIN) 800 MG tablet Take 1 tablet (800 mg total) by mouth every 8 (eight) hours as needed for moderate pain. 12/22/13   Sena Hitch, MD   BP 134/76  Pulse 56  Temp(Src) 98.9 F (37.2 C) (Oral)  Resp 16  SpO2 100%  LMP 11/28/2013 Physical Exam  Constitutional: She is oriented to person, place, and time. She appears well-developed and well-nourished. No distress.  HENT:  Head: Normocephalic and atraumatic.  Right Ear: External ear normal.  Left Ear: External ear normal.  Mouth/Throat: Uvula is midline, oropharynx is clear and moist and mucous membranes are normal.  Eyes: Conjunctivae and EOM are normal. Right eye exhibits no discharge. Left eye exhibits no discharge.  Neck: Normal range of motion. Neck supple. No JVD present.  Cardiovascular: Normal rate,  regular rhythm and normal heart sounds.  Exam reveals no gallop and no friction rub.   No murmur heard. Pulmonary/Chest: Effort normal and breath sounds normal. No stridor. No respiratory distress. She has no wheezes. She has no rales. She exhibits tenderness.    Abdominal: Soft. Bowel sounds are normal. She exhibits no distension. There is no tenderness.  There is no rebound and no guarding.  Musculoskeletal: Normal range of motion. She exhibits no edema.  Neurological: She is alert and oriented to person, place, and time.  Skin: Skin is warm. No rash noted. She is not diaphoretic.  Psychiatric: She has a normal mood and affect. Her behavior is normal.    ED Course  Procedures (including critical care time) Labs Review Labs Reviewed  CBC  BASIC METABOLIC PANEL  PRO B NATRIURETIC PEPTIDE  I-STAT TROPOININ, ED    Imaging Review Dg Chest 2 View  12/22/2013   CLINICAL DATA:  Chest pain  EXAM: CHEST  2 VIEW  COMPARISON:  August 03, 2013  FINDINGS: The heart size and mediastinal contours are within normal limits. There is no focal infiltrate, pulmonary edema, or pleural effusion. The visualized skeletal structures are unremarkable.  IMPRESSION: No active cardiopulmonary disease.   Electronically Signed   By: Sherian Rein M.D.   On: 12/22/2013 19:58     EKG Interpretation None      MDM   Final diagnoses:  Musculoskeletal chest pain    Pt presents with chest wall pain. Also has sore throat. AFVSS. HDS. Oropharynx clear. Reproducible pain on exam. Non high risk features for PE. Low risk on wells. PERC negative. I Have a low suspicion this is related to pulmonary embolism. CXR neg for PNA, PTX, or mediastinal widening. ECG neg for acute dynamic changes suggestive of ischemia (NSR). No nausea, no lightheadedness, no syncope or near syncope. Likely msk pain related to strain or viral illness. Gave motrin in the ED for pain. Gave an rx for motrin for pain. I answered all of her questions and she was in agreement with the treatment plan. Strong return precautions given for worsening symptoms or any other alarming or concerning symptoms or issues. The patient was in agreement with the treatment plan and I answered all of their questions. The patient was stable for dc. At dc, the patient ambulated without difficulty, was moving all four extremities,  symptoms improved, NAD. and AOx4 Care discussed with my attending, Dr. Rhunette Croft. If performed and available, imaging studies and labs reviewed.     Sena Hitch, MD 12/22/13 2107

## 2013-12-22 NOTE — ED Notes (Signed)
Pt reports chest pain. Pt reports pain upon palpation to left chest. Denies nausea and vomiting. Reported shortness of breath earlier but denies now.

## 2013-12-22 NOTE — Discharge Instructions (Signed)
Chest Wall Pain °Chest wall pain is pain felt in or around the chest bones and muscles. It may take up to 6 weeks to get better. It may take longer if you are active. Chest wall pain can happen on its own. Other times, things like germs, injury, coughing, or exercise can cause the pain. °HOME CARE  °· Avoid activities that make you tired or cause pain. Try not to use your chest, belly (abdominal), or side muscles. Do not use heavy weights. °· Put ice on the sore area. °¨ Put ice in a plastic bag. °¨ Place a towel between your skin and the bag. °¨ Leave the ice on for 15-20 minutes for the first 2 days. °· Only take medicine as told by your doctor. °GET HELP RIGHT AWAY IF:  °· You have more pain or are very uncomfortable. °· You have a fever. °· Your chest pain gets worse. °· You have new problems. °· You feel sick to your stomach (nauseous) or throw up (vomit). °· You start to sweat or feel lightheaded. °· You have a cough with mucus (phlegm). °· You cough up blood. °MAKE SURE YOU:  °· Understand these instructions. °· Will watch your condition. °· Will get help right away if you are not doing well or get worse. °Document Released: 09/19/2007 Document Revised: 06/25/2011 Document Reviewed: 11/27/2010 °[employer]® Patient Information ©2015 [employer]. This information is not intended to replace advice given to you by your health care provider. Make sure you discuss any questions you have with your health care provider. ° °

## 2013-12-22 NOTE — ED Notes (Signed)
The patient said she started having chest pain after work today.  The patient said she was having SOB earlier but not now.  She rates her pain 9/10.  She denies any N/V, diarrhea, diaphoresis or any other symptoms.

## 2013-12-25 NOTE — ED Provider Notes (Signed)
I saw and evaluated the patient, reviewed the resident's note and I agree with the findings and plan.   EKG Interpretation   Date/Time:  Tuesday December 22 2013 18:04:00 EDT Ventricular Rate:  89 PR Interval:  146 QRS Duration: 88 QT Interval:  376 QTC Calculation: 457 R Axis:   75 Text Interpretation:  Normal sinus rhythm Normal ECG ED PHYSICIAN  INTERPRETATION AVAILABLE IN [facility] Confirmed by TEST, Record  ([id number]) on 12/24/2013 7:21:31 AM      Pt comes in w/ chest pain. Chest pain is reproducible on exam. We did consider PE on the ddx - but pt has WELLS 0 and she is PERC neg. The EKG does have [id number] - and so we have opted to give her good return precautions on PE. Pt to return if sx get worse.  Derwood Kaplan, MD 12/25/13 [phone]

## 2013-12-31 ENCOUNTER — Encounter (HOSPITAL_COMMUNITY): Payer: Self-pay | Admitting: Emergency Medicine

## 2013-12-31 ENCOUNTER — Emergency Department (HOSPITAL_COMMUNITY)
Admission: EM | Admit: 2013-12-31 | Discharge: 2013-12-31 | Disposition: A | Discharge status: Home / Self Care | Payer: BC Managed Care – PPO | Attending: Emergency Medicine | Admitting: Emergency Medicine

## 2013-12-31 DIAGNOSIS — J45909 Unspecified asthma, uncomplicated: Secondary | ICD-10-CM | POA: Insufficient documentation

## 2013-12-31 DIAGNOSIS — R21 Rash and other nonspecific skin eruption: Secondary | ICD-10-CM | POA: Insufficient documentation

## 2013-12-31 DIAGNOSIS — Z880 Allergy status to penicillin: Secondary | ICD-10-CM | POA: Insufficient documentation

## 2013-12-31 MED ORDER — DEXAMETHASONE SODIUM PHOSPHATE 10 MG/ML IJ SOLN
10.0000 mg | Freq: Once | INTRAMUSCULAR | Status: AC
  Administered 2013-12-31: 10 mg via INTRAMUSCULAR
  Filled 2013-12-31: qty 1

## 2013-12-31 MED ORDER — DIPHENHYDRAMINE HCL 25 MG PO CAPS
50.0000 mg | ORAL_CAPSULE | Freq: Once | ORAL | Status: AC
  Administered 2013-12-31: 50 mg via ORAL
  Filled 2013-12-31: qty 2

## 2013-12-31 MED ORDER — FAMOTIDINE 20 MG PO TABS: 20.0000 mg | ORAL_TABLET | Freq: Two times a day (BID) | ORAL | Status: DC

## 2013-12-31 MED ORDER — FAMOTIDINE 20 MG PO TABS
20.0000 mg | ORAL_TABLET | Freq: Once | ORAL | Status: AC
  Administered 2013-12-31: 20 mg via ORAL
  Filled 2013-12-31: qty 1

## 2013-12-31 MED ORDER — DIPHENHYDRAMINE HCL 25 MG PO TABS: 25.0000 mg | ORAL_TABLET | Freq: Four times a day (QID) | ORAL | Status: DC | PRN

## 2013-12-31 NOTE — ED Provider Notes (Signed)
CSN: [id number]     Arrival date & time 12/31/13  1216 History  This chart was scribed for non-physician practitioner Junious Silk, PA-C working with Gerhard Munch, MD by Annye Asa, ED Scribe. This patient was seen in room TR06C/TR06C and the patient's care was started at 2:22 PM.     Chief Complaint  Patient presents with  . Rash   The history is provided by the patient. No language interpreter was used.    HPI Comments: Bailey Carney is a 23 y.o. female who presents to the Emergency Department complaining of 2 days of a constant, itching, burning rash generalized over her entire body (neck, arms, trunk, legs, feet). She reports staying in a new bed two nights ago. Patient denies exposure to new soaps, detergents, or pets. She denies SOB or any sensation of her throat closing.    Past Medical History  Diagnosis Date  . Asthma    History reviewed. No pertinent past surgical history. No family history on file. History  Substance Use Topics  . Smoking status: Never Smoker   . Smokeless tobacco: Not on file  . Alcohol Use: No   OB History   Grav Para Term Preterm Abortions TAB SAB Ect Mult Living                 Review of Systems  Skin: Positive for rash.  All other systems reviewed and are negative.   Allergies  Amoxicillin  Home Medications   Prior to Admission medications   Not on File   BP 146/94  Pulse 67  Temp(Src) 98.2 F (36.8 C) (Oral)  Resp 18  SpO2 100%  LMP 11/28/2013 Physical Exam  Nursing note and vitals reviewed. Constitutional: She is oriented to person, place, and time. She appears well-developed and well-nourished. No distress.  HENT:  Head: Normocephalic and atraumatic.  Right Ear: External ear normal.  Left Ear: External ear normal.  Nose: Nose normal.  Mouth/Throat: Oropharynx is clear and moist.  Easily maintaining oral secretions   Eyes: Conjunctivae are normal.  Neck: Normal range of motion.  Cardiovascular: Normal rate,  regular rhythm and normal heart sounds.   Pulmonary/Chest: Effort normal and breath sounds normal. No stridor. No respiratory distress. She has no wheezes. She has no rales.  Abdominal: Soft. She exhibits no distension.  Musculoskeletal: Normal range of motion.  Neurological: She is alert and oriented to person, place, and time. She has normal strength.  Speaking in full sentences  Skin: Skin is warm and dry. She is not diaphoretic. There is erythema.  Multiple erythematous urticarial areas to bilateral arms and abdomen  Psychiatric: She has a normal mood and affect. Her behavior is normal.    ED Course  Procedures (including critical care time)  DIAGNOSTIC STUDIES: Oxygen Saturation is 100% on RA, normal by my interpretation.    COORDINATION OF CARE: 2:24 PM Discussed clinical suspicion of bed bugs. Patient will be prescribed Benadryl, Pepcid and a steroid shot (Decadron) to alleviate her symptoms.   Labs Review Labs Reviewed - No data to display  Imaging Review No results found.   EKG Interpretation None      MDM   Final diagnoses:  Rash   Patient presents to ED for evaluation of itchy rash. Patient with welts over arms and abdomen. Patient given information of bed bug extermination, encouraged to take benadryl and pepcid for itching. Unable to tell exact etiology of welts. Bed bug vs allergic. No concern for scabies with appearance of  rash. No new medications, foods, detergents, soaps, or animals. Patient given strict return instructions. Vital signs stable for discharge. Patient / Family / Caregiver informed of clinical course, understand medical decision-making process, and agree with plan.   I personally performed the services described in this documentation, which was scribed in my presence. The recorded information has been reviewed and is accurate.    Mora Bellman, PA-C 12/31/13 [phone]

## 2013-12-31 NOTE — Discharge Instructions (Signed)

## 2013-12-31 NOTE — ED Notes (Signed)
Pt reports itching red rash to bilateral arms and legs starting last night. Denies taking anything for it. Pt in NAD. AO x4.

## 2014-01-01 NOTE — ED Provider Notes (Signed)
Medical screening examination/treatment/procedure(s) were performed by non-physician practitioner and as supervising physician I was immediately available for consultation/collaboration.  Gerhard Munch, MD 01/01/14 [phone]

## 2014-02-09 ENCOUNTER — Emergency Department (HOSPITAL_COMMUNITY)
Admission: EM | Admit: 2014-02-09 | Discharge: 2014-02-09 | Disposition: A | Discharge status: Home / Self Care | Payer: BC Managed Care – PPO | Attending: Emergency Medicine | Admitting: Emergency Medicine

## 2014-02-09 ENCOUNTER — Encounter (HOSPITAL_COMMUNITY): Payer: Self-pay | Admitting: Emergency Medicine

## 2014-02-09 DIAGNOSIS — T148XXA Other injury of unspecified body region, initial encounter: Secondary | ICD-10-CM

## 2014-02-09 DIAGNOSIS — S45391A Other specified injury of superficial vein at shoulder and upper arm level, right arm, initial encounter: Secondary | ICD-10-CM

## 2014-02-09 DIAGNOSIS — Y847 Blood-sampling as the cause of abnormal reaction of the patient, or of later complication, without mention of misadventure at the time of the procedure: Secondary | ICD-10-CM | POA: Insufficient documentation

## 2014-02-09 DIAGNOSIS — L7682 Other postprocedural complications of skin and subcutaneous tissue: Secondary | ICD-10-CM | POA: Insufficient documentation

## 2014-02-09 DIAGNOSIS — S45801A Unspecified injury of other specified blood vessels at shoulder and upper arm level, right arm, initial encounter: Secondary | ICD-10-CM | POA: Insufficient documentation

## 2014-02-09 DIAGNOSIS — J45909 Unspecified asthma, uncomplicated: Secondary | ICD-10-CM | POA: Insufficient documentation

## 2014-02-09 MED ORDER — IBUPROFEN 400 MG PO TABS
400.0000 mg | ORAL_TABLET | Freq: Once | ORAL | Status: AC
  Administered 2014-02-09: 400 mg via ORAL
  Filled 2014-02-09: qty 1

## 2014-02-09 MED ORDER — HYDROCODONE-ACETAMINOPHEN 5-325 MG PO TABS: 1.0000 | ORAL_TABLET | Freq: Four times a day (QID) | ORAL | Status: DC | PRN

## 2014-02-09 MED ORDER — IBUPROFEN 600 MG PO TABS: 600.0000 mg | ORAL_TABLET | Freq: Three times a day (TID) | ORAL | Status: DC | PRN

## 2014-02-09 NOTE — Discharge Instructions (Signed)
Your vein was likely injured during your plasma donation today, which could have caused either a hematoma (bruise) or possibly superficial thrombophlebitis (injury and inflammation of superficial vein). The likelihood is low that this is a deep vein thrombosis, or that it would develop into one, but you need to [facility] tomorrow for an ultrasound to rule this out. Continue to use ice and heat for 20 minutes at a time every hour to help with pain and swelling. Elevate the extremity to help with swelling. Use motrin and norco for pain as directed as needed, but don't drive or operate heavy machinery while taking norco. Return to the ER for changes or worsening symptoms.   Hematoma A hematoma is a collection of blood under the skin, in an organ, in a body space, in a joint space, or in other tissue. The blood can clot to form a lump that you can see and feel. The lump is often firm and may sometimes become sore and tender. Most hematomas get better in a few days to weeks. However, some hematomas may be serious and require medical care. Hematomas can range in size from very small to very large. CAUSES  A hematoma can be caused by a blunt or penetrating injury. It can also be caused by spontaneous leakage from a blood vessel under the skin. Spontaneous leakage from a blood vessel is more likely to occur in older people, especially those taking blood thinners. Sometimes, a hematoma can develop after certain medical procedures. SIGNS AND SYMPTOMS   A firm lump on the body.  Possible pain and tenderness in the area.  Bruising.Blue, dark blue, purple-red, or yellowish skin may appear at the site of the hematoma if the hematoma is close to the surface of the skin. For hematomas in deeper tissues or body spaces, the signs and symptoms may be subtle. For example, an intra-abdominal hematoma may cause abdominal pain, weakness, fainting, and shortness of breath. An intracranial  hematoma may cause a headache or symptoms such as weakness, trouble speaking, or a change in consciousness. DIAGNOSIS  A hematoma can usually be diagnosed based on your medical history and a physical exam. Imaging tests may be needed if your health care provider suspects a hematoma in deeper tissues or body spaces, such as the abdomen, head, or chest. These tests may include ultrasonography or a CT scan.  TREATMENT  Hematomas usually go away on their own over time. Rarely does the blood need to be drained out of the body. Large hematomas or those that may affect vital organs will sometimes need surgical drainage or monitoring. HOME CARE INSTRUCTIONS   Apply ice to the injured area:   Put ice in a plastic bag.   Place a towel between your skin and the bag.   Leave the ice on for 20 minutes, 2-3 times a day for the first 1 to 2 days.   After the first 2 days, switch to using warm compresses on the hematoma.   Elevate the injured area to help decrease pain and swelling. Wrapping the area with an elastic bandage may also be helpful. Compression helps to reduce swelling and promotes shrinking of the hematoma. Make sure the bandage is not wrapped too tight.   If your hematoma is on a lower extremity and is painful, crutches may be helpful for a couple days.   Only take over-the-counter or prescription medicines as directed by your health care provider. SEEK IMMEDIATE MEDICAL CARE IF:  You have increasing pain, or your pain is not controlled with medicine.   You have a fever.   You have worsening swelling or discoloration.   Your skin over the hematoma breaks or starts bleeding.   Your hematoma is in your chest or abdomen and you have weakness, shortness of breath, or a change in consciousness.  Your hematoma is on your scalp (caused by a fall or injury) and you have a worsening headache or a change in alertness or consciousness. MAKE SURE YOU:   Understand these  instructions.  Will watch your condition.  Will get help right away if you are not doing well or get worse. Document Released: 11/15/2003 Document Revised: 12/03/2012 Document Reviewed: 09/10/2012 [facility] Patient Information 2015 [place], [place]. This information is not intended to replace advice given to you by your health care provider. Make sure you discuss any questions you have with your health care provider.  Cryotherapy Cryotherapy means treatment with cold. Ice or gel packs can be used to reduce both pain and swelling. Ice is the most helpful within the first 24 to 48 hours after an injury or flare-up from overusing a muscle or joint. Sprains, strains, spasms, burning pain, shooting pain, and aches can all be eased with ice. Ice can also be used when recovering from surgery. Ice is effective, has very few side effects, and is safe for most people to use. PRECAUTIONS  Ice is not a safe treatment option for people with:  Raynaud phenomenon. This is a condition affecting small blood vessels in the extremities. Exposure to cold may cause your problems to return.  Cold hypersensitivity. There are many forms of cold hypersensitivity, including:  Cold urticaria. Red, itchy hives appear on the skin when the tissues begin to warm after being iced.  Cold erythema. This is a red, itchy rash caused by exposure to cold.  Cold hemoglobinuria. Red blood cells break down when the tissues begin to warm after being iced. The hemoglobin that carry oxygen are passed into the urine because they cannot combine with blood proteins fast enough.  Numbness or altered sensitivity in the area being iced. If you have any of the following conditions, do not use ice until you have discussed cryotherapy with your caregiver:  Heart conditions, such as arrhythmia, angina, or chronic heart disease.  High blood pressure.  Healing wounds or open skin in the area being iced.  Current infections.  Rheumatoid  arthritis.  Poor circulation.  Diabetes. Ice slows the blood flow in the region it is applied. This is beneficial when trying to stop inflamed tissues from spreading irritating chemicals to surrounding tissues. However, if you expose your skin to cold temperatures for too long or without the proper protection, you can damage your skin or nerves. Watch for signs of skin damage due to cold. HOME CARE INSTRUCTIONS Follow these tips to use ice and cold packs safely.  Place a dry or damp towel between the ice and skin. A damp towel will cool the skin more quickly, so you may need to shorten the time that the ice is used.  For a more rapid response, add gentle compression to the ice.  Ice for no more than 10 to 20 minutes at a time. The bonier the area you are icing, the less time it will take to get the benefits of ice.  Check your skin after 5 minutes to make sure there are no signs of a poor response to cold or skin damage.  Rest  20 minutes or more between uses.  Once your skin is numb, you can end your treatment. You can test numbness by very lightly touching your skin. The touch should be so light that you do not see the skin dimple from the pressure of your fingertip. When using ice, most people will feel these normal sensations in this order: cold, burning, aching, and numbness.  Do not use ice on someone who cannot communicate their responses to pain, such as small children or people with dementia. HOW TO MAKE AN ICE PACK Ice packs are the most common way to use ice therapy. Other methods include ice massage, ice baths, and cryosprays. Muscle creams that cause a cold, tingly feeling do not offer the same benefits that ice offers and should not be used as a substitute unless recommended by your caregiver. To make an ice pack, do one of the following:  Place crushed ice or a bag of frozen vegetables in a sealable plastic bag. Squeeze out the excess air. Place this bag inside another plastic  bag. Slide the bag into a pillowcase or place a damp towel between your skin and the bag.  Mix 3 parts water with 1 part rubbing alcohol. Freeze the mixture in a sealable plastic bag. When you remove the mixture from the freezer, it will be slushy. Squeeze out the excess air. Place this bag inside another plastic bag. Slide the bag into a pillowcase or place a damp towel between your skin and the bag. SEEK MEDICAL CARE IF:  You develop white spots on your skin. This may give the skin a blotchy (mottled) appearance.  Your skin turns blue or pale.  Your skin becomes waxy or hard.  Your swelling gets worse. MAKE SURE YOU:   Understand these instructions.  Will watch your condition.  Will get help right away if you are not doing well or get worse. Document Released: 11/27/2010 Document Revised: 08/17/2013 Document Reviewed: 11/27/2010 [facility] Patient Information 2015 [place]. This information is not intended to replace advice given to you by your health care provider. Make sure you discuss any questions you have with your health care provider.

## 2014-02-09 NOTE — ED Notes (Addendum)
Pt. Reports she was giving plasma today and states they had trouble and had to move the needle around. Pt. States now right upper arm is swollen and painful. Pt. Given ice pack in triage.

## 2014-02-09 NOTE — ED Provider Notes (Signed)
CSN: [id number]     Arrival date & time 02/09/14  2027 History   First MD Initiated Contact with Patient 02/09/14 2123     Chief Complaint  Patient presents with  . Arm Pain     (Consider location/radiation/quality/duration/timing/severity/associated sxs/prior Treatment) HPI Comments: Bailey Carney is a 23 y.o. female with a PMHx of asthma, who presents to the ED today with complaints of RUE pain and swelling that developed after donating plasma at 7:30pm today. She reports that the technician was attempting to get the IV established for her donation when her vein "swelled up", and the tech tried to move the needle around which caused more swelling so they stopped trying and placed ice over the area. She states the pain is 7/10 tightness-type pain, constant, nonradiating located at the R antecubital area/biceps, worse with movement of the area and palpation, and without alleviating factors, she has tried ice with no relief. She states the area has become more swollen and has a "knot" now. Denies any fevers, chills, erythema, warmth, color changes, red streaking, numbness/tingling, weakness, CP, SOB, abd pain, n/v, arthralgias, or other myalgias. She was concerned about having a blood clot which prompted her to be seen today.   Patient is a 23 y.o. female presenting with arm pain. The history is provided by the patient. No language interpreter was used.  Arm Pain This is a new problem. The current episode started today. The problem occurs constantly. The problem has been unchanged. Associated symptoms include myalgias (RUE biceps/antecubital fossa). Pertinent negatives include no abdominal pain, arthralgias, chest pain, chills, diaphoresis, fever, headaches, joint swelling, nausea, neck pain, numbness, rash, visual change, vomiting or weakness. Exacerbated by: movement of R arm and palpation of the area. She has tried ice for the symptoms. The treatment provided no relief.    Past Medical History    Diagnosis Date  . Asthma    History reviewed. No pertinent past surgical history. No family history on file. History  Substance Use Topics  . Smoking status: Never Smoker   . Smokeless tobacco: Not on file  . Alcohol Use: No   OB History   Grav Para Term Preterm Abortions TAB SAB Ect Mult Living                 Review of Systems  Constitutional: Negative for fever, chills and diaphoresis.  Respiratory: Negative for shortness of breath.   Cardiovascular: Negative for chest pain and leg swelling.  Gastrointestinal: Negative for nausea, vomiting and abdominal pain.  Musculoskeletal: Positive for myalgias (RUE biceps/antecubital fossa). Negative for arthralgias, back pain, joint swelling, neck pain and neck stiffness.  Skin: Negative for color change and rash.       +RUE swelling  Neurological: Negative for weakness, numbness and headaches.  Hematological: Does not bruise/bleed easily.   10 Systems reviewed and are negative for acute change except as noted in the HPI.    Allergies  Amoxicillin  Home Medications   Prior to Admission medications   Not on File   BP 108/92  Pulse 68  Temp(Src) 98.3 F (36.8 C) (Oral)  Resp 18  SpO2 100%  LMP 01/14/2014 Physical Exam  Nursing note and vitals reviewed. Constitutional: She is oriented to person, place, and time. Vital signs are normal. She appears well-developed and well-nourished. No distress.  Afebrile, nontoxic, NAD  HENT:  Head: Normocephalic and atraumatic.  Mouth/Throat: Oropharynx is clear and moist and mucous membranes are normal.  Eyes: Conjunctivae and EOM are normal.  Right eye exhibits no discharge. Left eye exhibits no discharge.  Neck: Normal range of motion. Neck supple.  Cardiovascular: Normal rate and intact distal pulses.   Distal pulses intact, cap refill brisk and present  Pulmonary/Chest: Effort normal. No respiratory distress.  Abdominal: Soft. Normal appearance. She exhibits no distension.   Musculoskeletal:       Right elbow: She exhibits decreased range of motion (due to pain in biceps) and swelling (antecubital area). Tenderness found.       Arms: R antecubital fossa with pain just proximal to fossa, with a small area of swelling approx 4cm in diameter, no erythema or discoloration. Needle mark noted with small bruise surrounding it. No drainage or weeping. R elbow ROM limited due to pain in bicep/antecubital fossa. Wiggles all fingers with ease. Distal pulses and cap refill intact. Strength 5/5 in all extremities, sensation grossly intact, compartments soft.  Neurological: She is alert and oriented to person, place, and time. She has normal strength. No sensory deficit.  Skin: Skin is warm and dry. Bruising noted. No rash noted. No erythema.  RUE skin changes at needle site as noted above  Psychiatric: She has a normal mood and affect.    ED Course  Procedures (including critical care time) Labs Review Labs Reviewed - No data to display  Imaging Review No results found.   EKG Interpretation None      MDM   Final diagnoses:  Hematoma  Inj superficial vein at shldr/up arm, right arm, init    23y/o female with R antecubital fossa swelling/tenderness after IV. Superficial site, with mild swelling and tenderness noted. Discussed that it's low likelihood that it's a DVT, or would become a DVT, but pt concerned for this and opting for DVT study tomorrow as outpt since U/S is gone for the night. Extremity neurovascularly intact with soft compartments. Likely just hematoma or possibly superficial thrombophlebitis. Will apply heat and give motrin here. Will d/c with instructions to ice/heat, elevate, and use motrin and norco for pain. Will have her return tomorrow for u/s. I explained the diagnosis and have given explicit precautions to return to the ER including for any other new or worsening symptoms. The patient understands and accepts the medical plan as it's been dictated  and I have answered their questions. Discharge instructions concerning home care and prescriptions have been given. The patient is STABLE and is discharged to home in good condition.  BP 110/69  Pulse 72  Temp(Src) 98.3 F (36.8 C) (Oral)  Resp 18  SpO2 99%  LMP 01/14/2014  Meds ordered this encounter  Medications  . ibuprofen (ADVIL,MOTRIN) tablet 400 mg    Sig:   . ibuprofen (ADVIL,MOTRIN) 600 MG tablet    Sig: Take 1 tablet (600 mg total) by mouth every 8 (eight) hours as needed for mild pain or moderate pain.    Dispense:  30 tablet    Refill:  0    Order Specific Question:  Supervising Provider    Answer:  Eber HongMILLER, BRIAN D [[id number]]  . HYDROcodone-acetaminophen (NORCO) 5-325 MG per tablet    Sig: Take 1 tablet by mouth every 6 (six) hours as needed for severe pain.    Dispense:  6 tablet    Refill:  0    Order Specific Question:  Supervising Provider    Answer:  Vida RollerMILLER, BRIAN D [address][[id number]]       Bailey Carney Camprubi-Soms, PA-C 02/09/14 2221

## 2014-02-10 ENCOUNTER — Ambulatory Visit (HOSPITAL_COMMUNITY)
Admission: RE | Admit: 2014-02-10 | Discharge: 2014-02-10 | Disposition: A | Discharge status: Home / Self Care | Payer: BC Managed Care – PPO | Source: Ambulatory Visit | Attending: Emergency Medicine | Admitting: Emergency Medicine

## 2014-02-10 DIAGNOSIS — X58XXXA Exposure to other specified factors, initial encounter: Secondary | ICD-10-CM | POA: Insufficient documentation

## 2014-02-10 DIAGNOSIS — T148 Other injury of unspecified body region: Secondary | ICD-10-CM | POA: Insufficient documentation

## 2014-02-10 DIAGNOSIS — M79609 Pain in unspecified limb: Secondary | ICD-10-CM

## 2014-02-10 DIAGNOSIS — M7989 Other specified soft tissue disorders: Secondary | ICD-10-CM

## 2014-02-10 NOTE — ED Provider Notes (Signed)
Medical screening examination/treatment/procedure(s) were performed by non-physician practitioner and as supervising physician I was immediately available for consultation/collaboration.   EKG Interpretation None        Christopher J. Pollina, MD 02/10/14 0025 

## 2014-02-10 NOTE — Progress Notes (Addendum)
VASCULAR LAB PRELIMINARY  PRELIMINARY  PRELIMINARY  PRELIMINARY  Right upper extremity venous duplex completed.    Preliminary report:  Right :  No evidence of DVT or superficial thrombosis.    Loyalty Arentz, RVS 02/10/2014, 10:54 AM

## 2014-02-11 ENCOUNTER — Encounter (HOSPITAL_COMMUNITY): Payer: Self-pay | Admitting: Emergency Medicine

## 2014-02-11 ENCOUNTER — Emergency Department (HOSPITAL_COMMUNITY)
Admission: EM | Admit: 2014-02-11 | Discharge: 2014-02-11 | Disposition: A | Discharge status: Home / Self Care | Payer: BC Managed Care – PPO | Attending: Emergency Medicine | Admitting: Emergency Medicine

## 2014-02-11 DIAGNOSIS — Y848 Other medical procedures as the cause of abnormal reaction of the patient, or of later complication, without mention of misadventure at the time of the procedure: Secondary | ICD-10-CM | POA: Insufficient documentation

## 2014-02-11 DIAGNOSIS — S40021A Contusion of right upper arm, initial encounter: Secondary | ICD-10-CM | POA: Insufficient documentation

## 2014-02-11 DIAGNOSIS — J45909 Unspecified asthma, uncomplicated: Secondary | ICD-10-CM | POA: Insufficient documentation

## 2014-02-11 DIAGNOSIS — Y9289 Other specified places as the place of occurrence of the external cause: Secondary | ICD-10-CM | POA: Insufficient documentation

## 2014-02-11 DIAGNOSIS — Z880 Allergy status to penicillin: Secondary | ICD-10-CM | POA: Insufficient documentation

## 2014-02-11 DIAGNOSIS — Y9389 Activity, other specified: Secondary | ICD-10-CM | POA: Insufficient documentation

## 2014-02-11 NOTE — ED Provider Notes (Signed)
CSN: [id number]     Arrival date & time 02/11/14  1443 History   First MD Initiated Contact with Patient 02/11/14 1604     Chief Complaint  Patient presents with  . Arm Pain      HPI Patient presents to emergency department because of 48 hours of swelling of her right upper extremity after plasma donation.  She had a DVT vascular study performed yesterday which was negative for DVT.  She denies numbness or weakness of her right hand.  She is on Hydrea codon and ibuprofen for the pain and she states is not helping with the pain.  She continues to have swelling and bruising in her anterior right distal upper arm.  No fevers or chills.  No other complaints   Past Medical History  Diagnosis Date  . Asthma    History reviewed. No pertinent past surgical history. No family history on file. History  Substance Use Topics  . Smoking status: Never Smoker   . Smokeless tobacco: Not on file  . Alcohol Use: No   OB History   Grav Para Term Preterm Abortions TAB SAB Ect Mult Living                 Review of Systems  All other systems reviewed and are negative.     Allergies  Amoxicillin  Home Medications   Prior to Admission medications   Medication Sig Start Date End Date Taking? Authorizing Provider  HYDROcodone-acetaminophen (NORCO) 5-325 MG per tablet Take 1 tablet by mouth every 6 (six) hours as needed for severe pain. 02/09/14   Mercedes Strupp Camprubi-Soms, PA-C  ibuprofen (ADVIL,MOTRIN) 600 MG tablet Take 1 tablet (600 mg total) by mouth every 8 (eight) hours as needed for mild pain or moderate pain. 02/09/14   Mercedes Strupp Camprubi-Soms, PA-C   BP 123/78  Pulse 58  Temp(Src) 98.2 F (36.8 C) (Oral)  Resp 16  Ht 5\' 7"  (1.702 m)  Wt 240 lb (108.863 kg)  BMI 37.58 kg/m2  SpO2 100%  LMP 01/14/2014 Physical Exam  Nursing note and vitals reviewed. Constitutional: She is oriented to person, place, and time. She appears well-developed and well-nourished.  HENT:   Head: Normocephalic.  Eyes: EOM are normal.  Neck: Normal range of motion.  Pulmonary/Chest: Effort normal.  Abdominal: She exhibits no distension.  Musculoskeletal: Normal range of motion.  Swelling and bruising localized to the area just proximal to the right antecubital fossa.  Normal right radial pulse.  Normal grip strength in right hand.  Full range of motion of right shoulder and right elbow.  Compartment soft.  Neurological: She is alert and oriented to person, place, and time.  Psychiatric: She has a normal mood and affect.    ED Course  Procedures (including critical care time)  VASCULAR LAB  PRELIMINARY PRELIMINARY PRELIMINARY PRELIMINARY  Right upper extremity venous duplex completed.  Preliminary report: Right : No evidence of DVT or superficial thrombosis.  SLAUGHTER, [place], RVS  02/10/2014, 10:54 AM  Labs Review Labs Reviewed - No data to display  Imaging Review No results found.   EKG Interpretation None      MDM   Final diagnoses:  Traumatic hematoma of right upper arm, initial encounter    Recommend Ace bandage, elevation, ice.  This will improve.    Lyanne CoKevin M Merril Nagy, MD 02/11/14 [phone]

## 2014-02-11 NOTE — ED Notes (Signed)
Pt c/o left arm pain since yesterday and has had a study done for a DVT and it was negative. Has been taking hydrocodone but it is not helping. Donated plasma on Tuesday and and her arm has been bruised and swollen since then.

## 2014-03-08 ENCOUNTER — Encounter (HOSPITAL_COMMUNITY): Payer: Self-pay | Admitting: *Deleted

## 2014-03-08 ENCOUNTER — Inpatient Hospital Stay (HOSPITAL_COMMUNITY)
Admission: AD | Admit: 2014-03-08 | Discharge: 2014-03-08 | Disposition: A | Discharge status: Home / Self Care | Payer: No Typology Code available for payment source | Source: Ambulatory Visit | Attending: Family Medicine | Admitting: Family Medicine

## 2014-03-08 DIAGNOSIS — B379 Candidiasis, unspecified: Secondary | ICD-10-CM | POA: Insufficient documentation

## 2014-03-08 DIAGNOSIS — B3731 Acute candidiasis of vulva and vagina: Secondary | ICD-10-CM

## 2014-03-08 DIAGNOSIS — B9689 Other specified bacterial agents as the cause of diseases classified elsewhere: Secondary | ICD-10-CM

## 2014-03-08 DIAGNOSIS — B373 Candidiasis of vulva and vagina: Secondary | ICD-10-CM

## 2014-03-08 DIAGNOSIS — N760 Acute vaginitis: Secondary | ICD-10-CM | POA: Insufficient documentation

## 2014-03-08 DIAGNOSIS — A499 Bacterial infection, unspecified: Secondary | ICD-10-CM

## 2014-03-08 LAB — URINALYSIS, ROUTINE W REFLEX MICROSCOPIC
Bilirubin Urine: NEGATIVE
Glucose, UA: NEGATIVE mg/dL
HGB URINE DIPSTICK: NEGATIVE
Ketones, ur: NEGATIVE mg/dL
Nitrite: NEGATIVE
PROTEIN: NEGATIVE mg/dL
Specific Gravity, Urine: 1.025 (ref 1.005–1.030)
UROBILINOGEN UA: 0.2 mg/dL (ref 0.0–1.0)
pH: 6 (ref 5.0–8.0)

## 2014-03-08 LAB — URINE MICROSCOPIC-ADD ON

## 2014-03-08 LAB — WET PREP, GENITAL: TRICH WET PREP: NONE SEEN

## 2014-03-08 LAB — HIV ANTIBODY (ROUTINE TESTING W REFLEX): HIV 1&2 Ab, 4th Generation: NONREACTIVE

## 2014-03-08 LAB — POCT PREGNANCY, URINE: PREG TEST UR: NEGATIVE

## 2014-03-08 LAB — HEPATITIS B SURFACE ANTIGEN: HEP B S AG: NEGATIVE

## 2014-03-08 MED ORDER — METRONIDAZOLE 500 MG PO TABS: 500.0000 mg | ORAL_TABLET | Freq: Two times a day (BID) | ORAL | Status: AC

## 2014-03-08 MED ORDER — FLUCONAZOLE 150 MG PO TABS: 150.0000 mg | ORAL_TABLET | Freq: Once | ORAL | Status: DC

## 2014-03-08 NOTE — MAU Note (Signed)
Pt c/o vaginal pain with discharge and spotting for last few days, seems worse when having sexual intercourse. Pt concerned about STDs and unsure if pregnant or not.

## 2014-03-08 NOTE — Discharge Instructions (Signed)
Monilial Vaginitis Vaginitis in a soreness, swelling and redness (inflammation) of the vagina and vulva. Monilial vaginitis is not a sexually transmitted infection. CAUSES  Yeast vaginitis is caused by yeast (candida) that is normally found in your vagina. With a yeast infection, the candida has overgrown in number to a point that upsets the chemical balance. SYMPTOMS   White, thick vaginal discharge.  Swelling, itching, redness and irritation of the vagina and possibly the lips of the vagina (vulva).  Burning or painful urination.  Painful intercourse. DIAGNOSIS  Things that may contribute to monilial vaginitis are:  Postmenopausal and virginal states.  Pregnancy.  Infections.  Being tired, sick or stressed, especially if you had monilial vaginitis in the past.  Diabetes. Good control will help lower the chance.  Birth control pills.  Tight fitting garments.  Using bubble bath, feminine sprays, douches or deodorant tampons.  Taking certain medications that kill germs (antibiotics).  Sporadic recurrence can occur if you become ill. TREATMENT  Your caregiver will give you medication.  There are several kinds of anti monilial vaginal creams and suppositories specific for monilial vaginitis. For recurrent yeast infections, use a suppository or cream in the vagina 2 times a week, or as directed.  Anti-monilial or steroid cream for the itching or irritation of the vulva may also be used. Get your caregiver's permission.  Painting the vagina with methylene blue solution may help if the monilial cream does not work.  Eating yogurt may help prevent monilial vaginitis. HOME CARE INSTRUCTIONS   Finish all medication as prescribed.  Do not have sex until treatment is completed or after your caregiver tells you it is okay.  Take warm sitz baths.  Do not douche.  Do not use tampons, especially scented ones.  Wear cotton underwear.  Avoid tight pants and panty  hose.  Tell your sexual partner that you have a yeast infection. They should go to their caregiver if they have symptoms such as mild rash or itching.  Your sexual partner should be treated as well if your infection is difficult to eliminate.  Practice safer sex. Use condoms.  Some vaginal medications cause latex condoms to fail. Vaginal medications that harm condoms are:  Cleocin cream.  Butoconazole (Femstat).  Terconazole (Terazol) vaginal suppository.  Miconazole (Monistat) (may be purchased over the counter). SEEK MEDICAL CARE IF:   You have a temperature by mouth above 102 F (38.9 C).  The infection is getting worse after 2 days of treatment.  The infection is not getting better after 3 days of treatment.  You develop blisters in or around your vagina.  You develop vaginal bleeding, and it is not your menstrual period.  You have pain when you urinate.  You develop intestinal problems.  You have pain with sexual intercourse. Document Released: 01/10/2005 Document Revised: 06/25/2011 Document Reviewed: 09/24/2008 ExitCare Patient Information 2015 [employer]. This information is not intended to replace advice given to you by your health care provider. Make sure you discuss any questions you have with your health care provider. Bacterial Vaginosis Bacterial vaginosis is a vaginal infection that occurs when the normal balance of bacteria in the vagina is disrupted. It results from an overgrowth of certain bacteria. This is the most common vaginal infection in women of childbearing age. Treatment is important to prevent complications, especially in pregnant women, as it can cause a premature delivery. CAUSES  Bacterial vaginosis is caused by an increase in harmful bacteria that are normally present in smaller amounts in the   vagina. Several different kinds of bacteria can cause bacterial vaginosis. However, the reason that the condition develops is not fully  understood. RISK FACTORS Certain activities or behaviors can put you at an increased risk of developing bacterial vaginosis, including:  Having a new sex partner or multiple sex partners.  Douching.  Using an intrauterine device (IUD) for contraception. Women do not get bacterial vaginosis from toilet seats, bedding, swimming pools, or contact with objects around them. SIGNS AND SYMPTOMS  Some women with bacterial vaginosis have no signs or symptoms. Common symptoms include:  Grey vaginal discharge.  A fishlike odor with discharge, especially after sexual intercourse.  Itching or burning of the vagina and vulva.  Burning or pain with urination. DIAGNOSIS  Your health care provider will take a medical history and examine the vagina for signs of bacterial vaginosis. A sample of vaginal fluid may be taken. Your health care provider will look at this sample under a microscope to check for bacteria and abnormal cells. A vaginal pH test may also be done.  TREATMENT  Bacterial vaginosis may be treated with antibiotic medicines. These may be given in the form of a pill or a vaginal cream. A second round of antibiotics may be prescribed if the condition comes back after treatment.  HOME CARE INSTRUCTIONS   Only take over-the-counter or prescription medicines as directed by your health care provider.  If antibiotic medicine was prescribed, take it as directed. Make sure you finish it even if you start to feel better.  Do not have sex until treatment is completed.  Tell all sexual partners that you have a vaginal infection. They should see their health care provider and be treated if they have problems, such as a mild rash or itching.  Practice safe sex by using condoms and only having one sex partner. SEEK MEDICAL CARE IF:   Your symptoms are not improving after 3 days of treatment.  You have increased discharge or pain.  You have a fever. MAKE SURE YOU:   Understand these  instructions.  Will watch your condition.  Will get help right away if you are not doing well or get worse. FOR MORE INFORMATION  Centers for Disease Control and Prevention, Division of STD Prevention: www.cdc.gov/std American Sexual Health Association (ASHA): www.ashastd.org  Document Released: 04/02/2005 Document Revised: 01/21/2013 Document Reviewed: 11/12/2012 ExitCare Patient Information 2015 [employer]. This information is not intended to replace advice given to you by your health care provider. Make sure you discuss any questions you have with your health care provider.  

## 2014-03-08 NOTE — MAU Provider Note (Signed)
History     CSN: [id number]  Arrival date and time: 03/08/14 1319   None     Chief Complaint  Patient presents with  . Vaginal Pain   HPI This is a 23 y.o. female who presents with c/o vaginal discharge and irritation. Burns more with intercourse. Wants STD testing.   RN Note: Pt c/o vaginal pain with discharge and spotting for last few days, seems worse when having sexual intercourse. Pt concerned about STDs and unsure if pregnant or not.      OB History    No data available      Past Medical History  Diagnosis Date  . Asthma     No past surgical history on file.  No family history on file.  History  Substance Use Topics  . Smoking status: Never Smoker   . Smokeless tobacco: Not on file  . Alcohol Use: No    Allergies:  Allergies  Allergen Reactions  . Amoxicillin Rash    Prescriptions prior to admission  Medication Sig Dispense Refill Last Dose  . HYDROcodone-acetaminophen (NORCO) 5-325 MG per tablet Take 1 tablet by mouth every 6 (six) hours as needed for severe pain. 6 tablet 0   . ibuprofen (ADVIL,MOTRIN) 600 MG tablet Take 1 tablet (600 mg total) by mouth every 8 (eight) hours as needed for mild pain or moderate pain. 30 tablet 0     Review of Systems  Constitutional: Negative for fever, chills and malaise/fatigue.  Gastrointestinal: Negative for nausea, vomiting and abdominal pain.  Genitourinary: Negative for dysuria.       Vaginal irritation and burning  Neurological: Negative for weakness and headaches.   Physical Exam   Temperature 97.8 F (36.6 C), temperature source Oral, resp. rate 16, height 5\' 7"  (1.702 m), weight 246 lb 9.6 oz (111.857 kg), last menstrual period 02/14/2014.  Physical Exam  Constitutional: She is oriented to person, place, and time. She appears well-developed and well-nourished. No distress.  HENT:  Head: Normocephalic.  Cardiovascular: Normal rate.   Respiratory: Effort normal.  GI: Soft.  Genitourinary:  Vaginal discharge (copious curdlike discharge) found.  Mild erethema  Musculoskeletal: Normal range of motion.  Neurological: She is alert and oriented to person, place, and time.  Skin: Skin is warm and dry.  Psychiatric: She has a normal mood and affect.    MAU Course  Procedures  MDM Results for orders placed or performed during the hospital encounter of 03/08/14 (from the past 24 hour(s))  Urinalysis, Routine w reflex microscopic     Status: Abnormal   Collection Time: 03/08/14  1:30 PM  Result Value Ref Range   Color, Urine YELLOW YELLOW   APPearance CLEAR CLEAR   Specific Gravity, Urine 1.025 1.005 - 1.030   pH 6.0 5.0 - 8.0   Glucose, UA NEGATIVE NEGATIVE mg/dL   Hgb urine dipstick NEGATIVE NEGATIVE   Bilirubin Urine NEGATIVE NEGATIVE   Ketones, ur NEGATIVE NEGATIVE mg/dL   Protein, ur NEGATIVE NEGATIVE mg/dL   Urobilinogen, UA 0.2 0.0 - 1.0 mg/dL   Nitrite NEGATIVE NEGATIVE   Leukocytes, UA TRACE (A) NEGATIVE  Urine microscopic-add on     Status: Abnormal   Collection Time: 03/08/14  1:30 PM  Result Value Ref Range   Squamous Epithelial / LPF FEW (A) RARE   WBC, UA 0-2 <3 WBC/hpf   Urine-Other FEW YEAST   Pregnancy, urine POC     Status: None   Collection Time: 03/08/14  1:40 PM  Result Value Ref  Range   Preg Test, Ur NEGATIVE NEGATIVE  Wet prep, genital     Status: Abnormal   Collection Time: 03/08/14  1:50 PM  Result Value Ref Range   Yeast Wet Prep HPF POC MODERATE (A) NONE SEEN   Trich, Wet Prep NONE SEEN NONE SEEN   Clue Cells Wet Prep HPF POC FEW (A) NONE SEEN   WBC, Wet Prep HPF POC FEW (A) NONE SEEN  HIV antibody     Status: None   Collection Time: 03/08/14  2:35 PM  Result Value Ref Range   HIV 1&2 Ab, 4th Generation NONREACTIVE NONREACTIVE  Hepatitis B surface antigen     Status: None   Collection Time: 03/08/14  2:35 PM  Result Value Ref Range   Hepatitis B Surface Ag NEGATIVE NEGATIVE     Assessment and Plan  A:  Yeast vaginitis       Mild  BV  P:  Discussed findings       Rx Diflucan and Flagyl       STD tests pending    [facility] PLL[facility]ILLIAMS,Sharnee Douglass 03/08/2014, 1:37 PM

## 2014-03-09 LAB — GC/CHLAMYDIA PROBE AMP
CT PROBE, AMP APTIMA: NEGATIVE
GC PROBE AMP APTIMA: NEGATIVE

## 2014-03-19 ENCOUNTER — Inpatient Hospital Stay (HOSPITAL_COMMUNITY)
Admission: AD | Admit: 2014-03-19 | Discharge: 2014-03-19 | Disposition: A | Discharge status: Home / Self Care | Payer: Self-pay | Source: Ambulatory Visit | Attending: Obstetrics & Gynecology | Admitting: Obstetrics & Gynecology

## 2014-03-19 ENCOUNTER — Encounter (HOSPITAL_COMMUNITY): Payer: Self-pay | Admitting: *Deleted

## 2014-03-19 DIAGNOSIS — A6000 Herpesviral infection of urogenital system, unspecified: Secondary | ICD-10-CM

## 2014-03-19 DIAGNOSIS — B3731 Acute candidiasis of vulva and vagina: Secondary | ICD-10-CM

## 2014-03-19 DIAGNOSIS — B009 Herpesviral infection, unspecified: Secondary | ICD-10-CM | POA: Insufficient documentation

## 2014-03-19 DIAGNOSIS — B373 Candidiasis of vulva and vagina: Secondary | ICD-10-CM | POA: Insufficient documentation

## 2014-03-19 LAB — URINALYSIS, ROUTINE W REFLEX MICROSCOPIC
Bilirubin Urine: NEGATIVE
GLUCOSE, UA: NEGATIVE mg/dL
HGB URINE DIPSTICK: NEGATIVE
KETONES UR: 15 mg/dL — AB
Leukocytes, UA: NEGATIVE
Nitrite: NEGATIVE
PH: 6.5 (ref 5.0–8.0)
Protein, ur: NEGATIVE mg/dL
Specific Gravity, Urine: 1.025 (ref 1.005–1.030)
Urobilinogen, UA: 0.2 mg/dL (ref 0.0–1.0)

## 2014-03-19 LAB — WET PREP, GENITAL: TRICH WET PREP: NONE SEEN

## 2014-03-19 LAB — POCT PREGNANCY, URINE: Preg Test, Ur: NEGATIVE

## 2014-03-19 MED ORDER — FLUCONAZOLE 150 MG PO TABS
150.0000 mg | ORAL_TABLET | Freq: Every day | ORAL | Status: DC
  Administered 2014-03-19: 150 mg via ORAL

## 2014-03-19 MED ORDER — VALACYCLOVIR HCL 1 G PO TABS: 1000.0000 mg | ORAL_TABLET | Freq: Two times a day (BID) | ORAL | Status: AC

## 2014-03-19 NOTE — MAU Note (Signed)
Pt reports she was dx with yeast infection several weeks ago. Did not get prescriptions filled but did use 3day OTC monostat. Stated she did not think she got any relief from that. Pt stated she noticed stinging when she urinated for the past 2 days. She looked today and saw a sore spot in her vaginal area.

## 2014-03-19 NOTE — MAU Provider Note (Signed)
History     CSN: [id number]  Arrival date and time: 03/19/14 2217   First Provider Initiated Contact with Patient 03/19/14 2241      Chief Complaint  Patient presents with  . Vaginitis   HPI  [name] is a 23 y.o. female G0P0000 who presents with concerns regarding a sore she noticed on her vagina 2 days ago. It is very tender and hurts when she urinates. She denies history of HSV. She has never noticed this before. She has had her current partner for 1 month.    OB History    Gravida Para Term Preterm AB TAB SAB Ectopic Multiple Living   0 0 0 0 0 0 0 0 0 0       Past Medical History  Diagnosis Date  . Asthma     Past Surgical History  Procedure Laterality Date  . No past surgeries      History reviewed. No pertinent family history.  History  Substance Use Topics  . Smoking status: Never Smoker   . Smokeless tobacco: Not on file  . Alcohol Use: No    Allergies:  Allergies  Allergen Reactions  . Amoxicillin Other (See Comments)    Unknown childhood reaction.    Prescriptions prior to admission  Medication Sig Dispense Refill Last Dose  . fluconazole (DIFLUCAN) 150 MG tablet Take 1 tablet (150 mg total) by mouth once. 1 tablet 3   . HYDROcodone-acetaminophen (NORCO) 5-325 MG per tablet Take 1 tablet by mouth every 6 (six) hours as needed for severe pain. 6 tablet 0 Past Month at Unknown time  . ibuprofen (ADVIL,MOTRIN) 200 MG tablet Take 400 mg by mouth every 6 (six) hours as needed for mild pain or moderate pain.   Past Month at Unknown time   Results for orders placed or performed during the hospital encounter of 03/19/14 (from the past 48 hour(s))  Urinalysis, Routine w reflex microscopic     Status: Abnormal   Collection Time: 03/19/14 10:20 PM  Result Value Ref Range   Color, Urine YELLOW YELLOW   APPearance CLEAR CLEAR   Specific Gravity, Urine 1.025 1.005 - 1.030   pH 6.5 5.0 - 8.0   Glucose, UA NEGATIVE NEGATIVE mg/dL   Hgb urine  dipstick NEGATIVE NEGATIVE   Bilirubin Urine NEGATIVE NEGATIVE   Ketones, ur 15 (A) NEGATIVE mg/dL   Protein, ur NEGATIVE NEGATIVE mg/dL   Urobilinogen, UA 0.2 0.0 - 1.0 mg/dL   Nitrite NEGATIVE NEGATIVE   Leukocytes, UA NEGATIVE NEGATIVE    Comment: MICROSCOPIC NOT DONE ON URINES WITH NEGATIVE PROTEIN, BLOOD, LEUKOCYTES, NITRITE, OR GLUCOSE <1000 mg/dL.  Pregnancy, urine POC     Status: None   Collection Time: 03/19/14 10:28 PM  Result Value Ref Range   Preg Test, Ur NEGATIVE NEGATIVE    Comment:        THE SENSITIVITY OF THIS METHODOLOGY IS >24 mIU/mL   Wet prep, genital     Status: Abnormal   Collection Time: 03/19/14 10:30 PM  Result Value Ref Range   Yeast Wet Prep HPF POC FEW (A) NONE SEEN   Trich, Wet Prep NONE SEEN NONE SEEN   Clue Cells Wet Prep HPF POC FEW (A) NONE SEEN   WBC, Wet Prep HPF POC FEW (A) NONE SEEN    Comment: MODERATE BACTERIA SEEN    Review of Systems  Constitutional: Negative for fever and chills.  Genitourinary: Negative for dysuria, urgency and frequency.  Sore near vaginal opening    Physical Exam   Blood pressure 142/104, pulse 76, temperature 98.6 F (37 C), temperature source Oral, resp. rate 18, height 5\' 7"  (1.702 m), weight 112.583 kg (248 lb 3.2 oz), last menstrual period 02/14/2014.  Physical Exam  Constitutional: She is oriented to person, place, and time. She appears well-developed and well-nourished. No distress.  HENT:  Head: Normocephalic.  Eyes: Pupils are equal, round, and reactive to light.  Neck: Neck supple.  Respiratory: Effort normal.  Genitourinary:    There is tenderness and lesion on the right labia.  Musculoskeletal: Normal range of motion.  Neurological: She is alert and oriented to person, place, and time. GCS eye subscore is 4. GCS verbal subscore is 5. GCS motor subscore is 6.  Skin: Skin is warm. She is not diaphoretic.  Psychiatric: Her behavior is normal.    MAU Course  Procedures  None  MDM UA    HSV  Wet prep GC- pending   Assessment and Plan   A:  1. Primary genital herpes simplex infection   2. Yeast vaginitis    P:  Discharge home in stable condition Diflucan given in MAU RX: Valtrex Patient will  Be notified of HSV results Return to MAU for emergencies Condoms always    Iona HansenJennifer Irene Willman Cuny, NP 03/20/2014 12:43 AM

## 2014-03-19 NOTE — Discharge Instructions (Signed)

## 2014-03-20 LAB — HIV ANTIBODY (ROUTINE TESTING W REFLEX): HIV 1&2 Ab, 4th Generation: NONREACTIVE

## 2014-03-22 LAB — HERPES SIMPLEX VIRUS CULTURE: Special Requests: NORMAL

## 2014-03-22 LAB — GC/CHLAMYDIA PROBE AMP
CT PROBE, AMP APTIMA: NEGATIVE
GC PROBE AMP APTIMA: NEGATIVE

## 2014-04-06 ENCOUNTER — Telehealth: Payer: Self-pay | Admitting: Obstetrics and Gynecology

## 2014-04-06 NOTE — Telephone Encounter (Signed)
Patient has called multilple times leaving messages to have me call her back regarding her prescriptions that were sent to the pharmacy. I reviewed my note from 12/4 and I sent diflucan and valtrex to her pharmacy. The patient is insisting that I prescribed her hydrocodone and that I did not send the RX to the pharmacy. I informed her that I did not prescribed hydrocodone and that we are unable to send narcotics to the pharmacy and that they would need to be printed. I informed her that based on my note I did not have a reason to send the patient home with hydrocodone and that I would not refill any narcotic pain medication at this time.

## 2014-05-03 ENCOUNTER — Inpatient Hospital Stay (HOSPITAL_COMMUNITY)
Admission: AD | Admit: 2014-05-03 | Discharge: 2014-05-03 | Disposition: A | Discharge status: Home / Self Care | Payer: Self-pay | Source: Ambulatory Visit | Attending: Family Medicine | Admitting: Family Medicine

## 2014-05-03 ENCOUNTER — Other Ambulatory Visit: Payer: Self-pay | Admitting: Medical

## 2014-05-03 ENCOUNTER — Encounter (HOSPITAL_COMMUNITY): Payer: Self-pay | Admitting: *Deleted

## 2014-05-03 DIAGNOSIS — N63 Unspecified lump in unspecified breast: Secondary | ICD-10-CM

## 2014-05-03 LAB — POCT PREGNANCY, URINE: Preg Test, Ur: NEGATIVE

## 2014-05-03 NOTE — MAU Provider Note (Signed)
History     CSN: [id number]  Arrival date and time: 05/03/14 1446   First Provider Initiated Contact with Patient 05/03/14 [phone]      Chief Complaint  Patient presents with  . Breast Mass   HPI  [name] is a 24 y.o. G0P0000 who presents to MAU today with complaint of painful right breast mass x 1 month. She states that when she first noted the mass there was a small amount of drainage, but none since. She denies redness or swelling. She feels that the mass is larger then when first noted and has become more firm. She rates pain at 6/10 now with palpation. She denies nipple discharge, bleeding or fever.    OB History    Gravida Para Term Preterm AB TAB SAB Ectopic Multiple Living        Past Medical History  Diagnosis Date  . Asthma     Past Surgical History  Procedure Laterality Date  . No past surgeries      History reviewed. No pertinent family history.  History  Substance Use Topics  . Smoking status: Never Smoker   . Smokeless tobacco: Not on file  . Alcohol Use: No    Allergies:  Allergies  Allergen Reactions  . Amoxicillin Other (See Comments)    Unknown childhood reaction.    Prescriptions prior to admission  Medication Sig Dispense Refill Last Dose  . fluconazole (DIFLUCAN) 150 MG tablet Take 1 tablet (150 mg total) by mouth once. (Patient not taking: Reported on 05/03/2014) 1 tablet 3 Not Taking at Unknown time  . HYDROcodone-acetaminophen (NORCO) 5-325 MG per tablet Take 1 tablet by mouth every 6 (six) hours as needed for severe pain. (Patient not taking: Reported on 05/03/2014) 6 tablet 0 Not Taking at Unknown time    Review of Systems  Constitutional: Negative for fever and malaise/fatigue.  Genitourinary:       + breast mass Neg - nipple discharge, bleeding   Physical Exam   Blood pressure 139/90, pulse 70, temperature 98.6 F (37 C), temperature source Oral, resp. rate 18, weight 242 lb (109.77 kg), last  menstrual period 04/19/2014.  Physical Exam  Constitutional: She is oriented to person, place, and time. She appears well-developed and well-nourished. No distress.  HENT:  Head: Normocephalic.  Cardiovascular: Normal rate.   Respiratory: Effort normal. Right breast exhibits tenderness (mild tenderness over mass). Right breast exhibits no inverted nipple, no mass, no nipple discharge and no skin change. Left breast exhibits no inverted nipple, no mass, no nipple discharge, no skin change and no tenderness. Breasts are symmetrical.    Neurological: She is alert and oriented to person, place, and time.  Skin: Skin is warm and dry. No erythema.  Psychiatric: She has a normal mood and affect.   Results for orders placed or performed during the hospital encounter of 05/03/14 (from the past 24 hour(s))  Pregnancy, urine POC     Status: None   Collection Time: 05/03/14  3:17 PM  Result Value Ref Range   Preg Test, Ur NEGATIVE NEGATIVE    MAU Course  Procedures None  MDM UPT - negative [facility]   Assessment and Plan  A: Right breast mass  P: Discharge home Recommended Ibuprofen for pain PRN Referred to [facility] for imaging. They will call the patient with an appointment date/time Warning signs for abscess discussed. Patient [facility]  [facility] if she develops worsening symptoms, surrounding erythema or fever Patient may return to MAU as needed or if her condition were to change or worsen   Marny LowensteinJulie N Charman Blasco, PA-C  05/03/2014, 4:02 PM

## 2014-05-03 NOTE — Discharge Instructions (Signed)
Breast Self-Awareness  Breast self-awareness allows you to notice a breast problem early while it is still small. Do a breast self-exam:  · Every month, 5-7 days after your period (menstrual period).  · At the same time each month if you do not have periods anymore.  Look for any:  · Difference between your breasts (size, shape, or position).  · Change in breast shape or size.  · Fluid or blood coming from your nipples.  · Changes in your nipples (dimpling, nipple movement).  ·  Change in skin color or texture (redness, scaly areas).  Feel for:  · Lumps.  · Bumps.  · Dips.  · Any other changes.  HOW TO DO A BREAST SELF-EXAM  Look at your breasts and nipples.  1. Take off all your clothes above your waist.  2. Stand in front of a mirror in a room with good lighting.  3. Put your hands on your hips and push your hands downward.  Feel your breasts.   1. Lie flat on your back or stand in the shower or tub. If you are in the shower or tub, have wet, soapy hands.  2. Place your right arm above your head.  3. Place your left hand in the right underarm area.  4. Make small circles using the pads (not the fingertips) of your 3 middle fingers. Press lightly and then with medium and firm pressure.  5. Move your fingers a little lower and make the small circles at the 3 pressures (light, medium, and firm).  6. Continue moving your fingers lower and making circles until you reach the bottom of your breast.  7. Move your fingers one finger-width towards the center of the body.  8. Continue making the circles, this time moving upward until you reach the bottom of your neck.  9. Move your fingers one finger-width towards the center of your body.  10. Make circles downward when starting at the bottom of the neck. Make circles upward when starting at the bottom of the breast. Stop when you reach the middle of the chest.  11.  Repeat these steps on the other breast.  Write down what looks and feels normal for each breast. Also write  down any changes you notice.  GET HELP RIGHT AWAY IF:  · You see any changes in your breasts or nipples.  · You see skin changes.  · You have unusual discharge from your nipples.  · You feel a new lump.  · You feel unusually thick areas.  Document Released: 09/19/2007 Document Revised: 03/19/2012 Document Reviewed: 07/18/2011  ExitCare® Patient Information ©2015 [employer]. This information is not intended to replace advice given to you by your health care provider. Make sure you discuss any questions you have with your health care provider.

## 2014-05-03 NOTE — MAU Note (Signed)
Has a knot on her chest, been there for a month, hurts and is getting bigger.

## 2014-05-10 ENCOUNTER — Other Ambulatory Visit (HOSPITAL_COMMUNITY): Payer: Self-pay | Admitting: *Deleted

## 2014-05-10 DIAGNOSIS — N6310 Unspecified lump in the right breast, unspecified quadrant: Secondary | ICD-10-CM

## 2014-05-20 ENCOUNTER — Ambulatory Visit (HOSPITAL_COMMUNITY)
Admission: RE | Admit: 2014-05-20 | Discharge: 2014-05-20 | Disposition: A | Discharge status: Home / Self Care | Payer: Self-pay | Source: Ambulatory Visit | Attending: Obstetrics and Gynecology | Admitting: Obstetrics and Gynecology

## 2014-05-20 ENCOUNTER — Ambulatory Visit
Admission: RE | Admit: 2014-05-20 | Discharge: 2014-05-20 | Disposition: A | Discharge status: Home / Self Care | Payer: No Typology Code available for payment source | Source: Ambulatory Visit | Attending: Obstetrics and Gynecology | Admitting: Obstetrics and Gynecology

## 2014-05-20 ENCOUNTER — Encounter (HOSPITAL_COMMUNITY): Payer: Self-pay

## 2014-05-20 VITALS — BP 110/68 | Temp 98.2°F | Ht 67.0 in | Wt 243.4 lb

## 2014-05-20 DIAGNOSIS — N6310 Unspecified lump in the right breast, unspecified quadrant: Secondary | ICD-10-CM

## 2014-05-20 DIAGNOSIS — N6315 Unspecified lump in the right breast, overlapping quadrants: Secondary | ICD-10-CM

## 2014-05-20 DIAGNOSIS — Z1239 Encounter for other screening for malignant neoplasm of breast: Secondary | ICD-10-CM

## 2014-05-20 NOTE — Progress Notes (Signed)
Complaints of right breast lump x 2 months that was draining from the lump and painful. Patient states pain comes and goes rating it at a 7 out of 10.  Pap Smear:  Pap smear not completed today. [facility] and normal. Per patient has no history of an abnormal Pap smear. No Pap smear results in EPIC.  Physical exam: Breasts Breasts symmetrical. No skin abnormalities left breast. Healing area right inner breast that per patient was draining. No nipple retraction bilateral breasts. No nipple discharge bilateral breasts. No lymphadenopathy. No lumps palpated left breast. Palpated a lump within the right breast at 3 o'clock 10 cm from the nipple that is reddish/purple in color. Complaints of tenderness when palpated lump. [facility]. Appointment scheduled for Thursday, May 20, 2014 at 1030.      Pelvic/Bimanual No Pap smear completed today since last Pap smear was in December 2015 per patient. Pap smear not indicated per BCCCP guidelines.

## 2014-05-20 NOTE — Patient Instructions (Signed)
Explained to Bailey Carney Thurmond that she did not need a Pap smear today due to last Pap smear was in December 2015 per patient. Let her know BCCCP will cover Pap smears every 3 years unless has a history of abnormal Pap smears. [facility]. Appointment scheduled for Thursday, May 20, 2014 at 1030. Patient aware of appointment and will be there. Bailey Carney Mateus verbalized understanding.  Pheng Prokop, Kathaleen Maserhristine Poll, RN 9:54 AM

## 2014-07-15 ENCOUNTER — Encounter (HOSPITAL_COMMUNITY): Payer: Self-pay | Admitting: *Deleted

## 2014-07-15 ENCOUNTER — Emergency Department (HOSPITAL_COMMUNITY)
Admission: EM | Admit: 2014-07-15 | Discharge: 2014-07-16 | Disposition: A | Discharge status: Home / Self Care | Payer: No Typology Code available for payment source | Attending: Emergency Medicine | Admitting: Emergency Medicine

## 2014-07-15 ENCOUNTER — Emergency Department (HOSPITAL_COMMUNITY): Payer: No Typology Code available for payment source

## 2014-07-15 DIAGNOSIS — Z880 Allergy status to penicillin: Secondary | ICD-10-CM | POA: Insufficient documentation

## 2014-07-15 DIAGNOSIS — S161XXA Strain of muscle, fascia and tendon at neck level, initial encounter: Secondary | ICD-10-CM

## 2014-07-15 DIAGNOSIS — Y999 Unspecified external cause status: Secondary | ICD-10-CM | POA: Insufficient documentation

## 2014-07-15 DIAGNOSIS — Y939 Activity, unspecified: Secondary | ICD-10-CM | POA: Insufficient documentation

## 2014-07-15 DIAGNOSIS — S39012A Strain of muscle, fascia and tendon of lower back, initial encounter: Secondary | ICD-10-CM | POA: Insufficient documentation

## 2014-07-15 DIAGNOSIS — Y92410 Unspecified street and highway as the place of occurrence of the external cause: Secondary | ICD-10-CM | POA: Insufficient documentation

## 2014-07-15 MED ORDER — NAPROXEN 250 MG PO TABS
500.0000 mg | ORAL_TABLET | Freq: Once | ORAL | Status: AC
  Administered 2014-07-15: 500 mg via ORAL
  Filled 2014-07-15: qty 2

## 2014-07-15 MED ORDER — NAPROXEN 500 MG PO TABS: 500.0000 mg | ORAL_TABLET | Freq: Two times a day (BID) | ORAL | Status: DC

## 2014-07-15 NOTE — Discharge Instructions (Signed)
Take naproxen as prescribed. Rest, apply ice intermittently for the next 24 hours followed by heat. Avoid heavy lifting or hard physical activity.  Cervical Sprain A cervical sprain is an injury in the neck in which the strong, fibrous tissues (ligaments) that connect your neck bones stretch or tear. Cervical sprains can range from mild to severe. Severe cervical sprains can cause the neck vertebrae to be unstable. This can lead to damage of the spinal cord and can result in serious nervous system problems. The amount of time it takes for a cervical sprain to get better depends on the cause and extent of the injury. Most cervical sprains heal in 1 to 3 weeks. CAUSES  Severe cervical sprains may be caused by:   Contact sport injuries (such as from football, rugby, wrestling, hockey, auto racing, gymnastics, diving, martial arts, or boxing).   Motor vehicle collisions.   Whiplash injuries. This is an injury from a sudden forward and backward whipping movement of the head and neck.  Falls.  Mild cervical sprains may be caused by:   Being in an awkward position, such as while cradling a telephone between your ear and shoulder.   Sitting in a chair that does not offer proper support.   Working at a poorly Marketing executivedesigned computer station.   Looking up or down for long periods of time.  SYMPTOMS   Pain, soreness, stiffness, or a burning sensation in the front, back, or sides of the neck. This discomfort may develop immediately after the injury or slowly, 24 hours or more after the injury.   Pain or tenderness directly in the middle of the back of the neck.   Shoulder or upper back pain.   Limited ability to move the neck.   Headache.   Dizziness.   Weakness, numbness, or tingling in the hands or arms.   Muscle spasms.   Difficulty swallowing or chewing.   Tenderness and swelling of the neck.  DIAGNOSIS  Most of the time your health care provider can diagnose a  cervical sprain by taking your history and doing a physical exam. Your health care provider will ask about previous neck injuries and any known neck problems, such as arthritis in the neck. X-rays may be taken to find out if there are any other problems, such as with the bones of the neck. Other tests, such as a CT scan or MRI, may also be needed.  TREATMENT  Treatment depends on the severity of the cervical sprain. Mild sprains can be treated with rest, keeping the neck in place (immobilization), and pain medicines. Severe cervical sprains are immediately immobilized. Further treatment is done to help with pain, muscle spasms, and other symptoms and may include:  Medicines, such as pain relievers, numbing medicines, or muscle relaxants.   Physical therapy. This may involve stretching exercises, strengthening exercises, and posture training. Exercises and improved posture can help stabilize the neck, strengthen muscles, and help stop symptoms from returning.  HOME CARE INSTRUCTIONS   Put ice on the injured area.   Put ice in a plastic bag.   Place a towel between your skin and the bag.   Leave the ice on for 15-20 minutes, 3-4 times a day.   If your injury was severe, you may have been given a cervical collar to wear. A cervical collar is a two-piece collar designed to keep your neck from moving while it heals.  Do not remove the collar unless instructed by your health care provider.  If  you have long hair, keep it outside of the collar.  Ask your health care provider before making any adjustments to your collar. Minor adjustments may be required over time to improve comfort and reduce pressure on your chin or on the back of your head.  Ifyou are allowed to remove the collar for cleaning or bathing, follow your health care provider's instructions on how to do so safely.  Keep your collar clean by wiping it with mild soap and water and drying it completely. If the collar you have  been given includes removable pads, remove them every 1-2 days and hand wash them with soap and water. Allow them to air dry. They should be completely dry before you wear them in the collar.  If you are allowed to remove the collar for cleaning and bathing, wash and dry the skin of your neck. Check your skin for irritation or sores. If you see any, tell your health care provider.  Do not drive while wearing the collar.   Only take over-the-counter or prescription medicines for pain, discomfort, or fever as directed by your health care provider.   Keep all follow-up appointments as directed by your health care provider.   Keep all physical therapy appointments as directed by your health care provider.   Make any needed adjustments to your workstation to promote good posture.   Avoid positions and activities that make your symptoms worse.   Warm up and stretch before being active to help prevent problems.  SEEK MEDICAL CARE IF:   Your pain is not controlled with medicine.   You are unable to decrease your pain medicine over time as planned.   Your activity level is not improving as expected.  SEEK IMMEDIATE MEDICAL CARE IF:   You develop any bleeding.  You develop stomach upset.  You have signs of an allergic reaction to your medicine.   Your symptoms get worse.   You develop new, unexplained symptoms.   You have numbness, tingling, weakness, or paralysis in any part of your body.  MAKE SURE YOU:   Understand these instructions.  Will watch your condition.  Will get help right away if you are not doing well or get worse. Document Released: 01/28/2007 Document Revised: 04/07/2013 Document Reviewed: 10/08/2012 [facility] Patient Information 2015 [place], [place]. This information is not intended to replace advice given to you by your health care provider. Make sure you discuss any questions you have with your health care provider.  Muscle Strain A muscle strain  is an injury that occurs when a muscle is stretched beyond its normal length. Usually a small number of muscle fibers are torn when this happens. Muscle strain is rated in degrees. First-degree strains have the least amount of muscle fiber tearing and pain. Second-degree and third-degree strains have increasingly more tearing and pain.  Usually, recovery from muscle strain takes 1-2 weeks. Complete healing takes 5-6 weeks.  CAUSES  Muscle strain happens when a sudden, violent force placed on a muscle stretches it too far. This may occur with lifting, sports, or a fall.  RISK FACTORS Muscle strain is especially common in athletes.  SIGNS AND SYMPTOMS At the site of the muscle strain, there may be:  Pain.  Bruising.  Swelling.  Difficulty using the muscle due to pain or lack of normal function. DIAGNOSIS  Your health care provider will perform a physical exam and ask about your medical history. TREATMENT  Often, the best treatment for a muscle strain is resting, icing,  and applying cold compresses to the injured area.  HOME CARE INSTRUCTIONS   Use the PRICE method of treatment to promote muscle healing during the first 2-3 days after your injury. The PRICE method involves:  Protecting the muscle from being injured again.  Restricting your activity and resting the injured body part.  Icing your injury. To do this, put ice in a plastic bag. Place a towel between your skin and the bag. Then, apply the ice and leave it on from 15-20 minutes each hour. After the third day, switch to moist heat packs.  Apply compression to the injured area with a splint or elastic bandage. Be careful not to wrap it too tightly. This may interfere with blood circulation or increase swelling.  Elevate the injured body part above the level of your heart as often as you can.  Only take over-the-counter or prescription medicines for pain, discomfort, or fever as directed by your health care provider.  Warming  up prior to exercise helps to prevent future muscle strains. SEEK MEDICAL CARE IF:   You have increasing pain or swelling in the injured area.  You have numbness, tingling, or a significant loss of strength in the injured area. MAKE SURE YOU:   Understand these instructions.  Will watch your condition.  Will get help right away if you are not doing well or get worse. Document Released: 04/02/2005 Document Revised: 01/21/2013 Document Reviewed: 10/30/2012 [facility] Patient Information 2015 [place], [place]. This information is not intended to replace advice given to you by your health care provider. Make sure you discuss any questions you have with your health care provider.  Motor Vehicle Collision It is common to have multiple bruises and sore muscles after a motor vehicle collision (MVC). These tend to feel worse for the first 24 hours. You may have the most stiffness and soreness over the first several hours. You may also feel worse when you wake up the first morning after your collision. After this point, you will usually begin to improve with each day. The speed of improvement often depends on the severity of the collision, the number of injuries, and the location and nature of these injuries. HOME CARE INSTRUCTIONS  Put ice on the injured area.  Put ice in a plastic bag.  Place a towel between your skin and the bag.  Leave the ice on for 15-20 minutes, 3-4 times a day, or as directed by your health care provider.  Drink enough fluids to keep your urine clear or pale yellow. Do not drink alcohol.  Take a warm shower or bath once or twice a day. This will increase blood flow to sore muscles.  You may return to activities as directed by your caregiver. Be careful when lifting, as this may aggravate neck or back pain.  Only take over-the-counter or prescription medicines for pain, discomfort, or fever as directed by your caregiver. Do not use aspirin. This may increase bruising and  bleeding. SEEK IMMEDIATE MEDICAL CARE IF:  You have numbness, tingling, or weakness in the arms or legs.  You develop severe headaches not relieved with medicine.  You have severe neck pain, especially tenderness in the middle of the back of your neck.  You have changes in bowel or bladder control.  There is increasing pain in any area of the body.  You have shortness of breath, light-headedness, dizziness, or fainting.  You have chest pain.  You feel sick to your stomach (nauseous), throw up (vomit), or sweat.  You have increasing abdominal discomfort.  There is blood in your urine, stool, or vomit.  You have pain in your shoulder (shoulder strap areas).  You feel your symptoms are getting worse. MAKE SURE YOU:  Understand these instructions.  Will watch your condition.  Will get help right away if you are not doing well or get worse. Document Released: 04/02/2005 Document Revised: 08/17/2013 Document Reviewed: 08/30/2010 [facility] Patient Information 2015 [place], [place]. This information is not intended to replace advice given to you by your health care provider. Make sure you discuss any questions you have with your health care provider.
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
## 2014-09-26 ENCOUNTER — Inpatient Hospital Stay (HOSPITAL_COMMUNITY)
Admission: AD | Admit: 2014-09-26 | Discharge: 2014-09-27 | Disposition: A | Discharge status: Home / Self Care | Payer: No Typology Code available for payment source | Source: Ambulatory Visit | Attending: Family Medicine | Admitting: Family Medicine

## 2014-09-26 DIAGNOSIS — M545 Low back pain, unspecified: Secondary | ICD-10-CM

## 2014-09-26 DIAGNOSIS — R1010 Upper abdominal pain, unspecified: Secondary | ICD-10-CM

## 2014-09-26 DIAGNOSIS — R109 Unspecified abdominal pain: Secondary | ICD-10-CM | POA: Insufficient documentation

## 2014-09-26 DIAGNOSIS — I10 Essential (primary) hypertension: Secondary | ICD-10-CM | POA: Insufficient documentation

## 2014-09-26 HISTORY — DX: Essential (primary) hypertension: I10

## 2014-09-26 LAB — POCT PREGNANCY, URINE: Preg Test, Ur: NEGATIVE

## 2014-09-26 NOTE — MAU Note (Signed)
Mid abdominal & low back pain since Friday night. Denies vaginal bleeding or vaginal discharge. LMP 07/27/14. Pregnancy test in May that was negative.

## 2014-09-27 ENCOUNTER — Encounter (HOSPITAL_COMMUNITY): Payer: Self-pay | Admitting: *Deleted

## 2014-09-27 LAB — CBC
HCT: 37.8 % (ref 36.0–46.0)
HEMOGLOBIN: 12.8 g/dL (ref 12.0–15.0)
MCH: 29.3 pg (ref 26.0–34.0)
MCHC: 33.9 g/dL (ref 30.0–36.0)
MCV: 86.5 fL (ref 78.0–100.0)
PLATELETS: 173 10*3/uL (ref 150–400)
RBC: 4.37 MIL/uL (ref 3.87–5.11)
RDW: 13.1 % (ref 11.5–15.5)
WBC: 11.6 10*3/uL — ABNORMAL HIGH (ref 4.0–10.5)

## 2014-09-27 LAB — AMYLASE: AMYLASE: 56 U/L (ref 28–100)

## 2014-09-27 LAB — URINALYSIS, ROUTINE W REFLEX MICROSCOPIC
Bilirubin Urine: NEGATIVE
GLUCOSE, UA: NEGATIVE mg/dL
Hgb urine dipstick: NEGATIVE
KETONES UR: NEGATIVE mg/dL
Leukocytes, UA: NEGATIVE
Nitrite: NEGATIVE
PH: 6 (ref 5.0–8.0)
Protein, ur: NEGATIVE mg/dL
Specific Gravity, Urine: >1.03 — ABNORMAL HIGH (ref 1.005–1.030)
Urobilinogen, UA: 0.2 mg/dL (ref 0.0–1.0)

## 2014-09-27 LAB — COMPREHENSIVE METABOLIC PANEL
ALT: 13 U/L — AB (ref 14–54)
AST: 17 U/L (ref 15–41)
Albumin: 4 g/dL (ref 3.5–5.0)
Alkaline Phosphatase: 42 U/L (ref 38–126)
Anion gap: 9 (ref 5–15)
BILIRUBIN TOTAL: 0.5 mg/dL (ref 0.3–1.2)
BUN: 20 mg/dL (ref 6–20)
CO2: 28 mmol/L (ref 22–32)
Calcium: 9 mg/dL (ref 8.9–10.3)
Chloride: 102 mmol/L (ref 101–111)
Creatinine, Ser: 0.91 mg/dL (ref 0.44–1.00)
GFR calc Af Amer: >60 mL/min (ref >60)
Glucose, Bld: 90 mg/dL (ref 65–99)
POTASSIUM: 3.8 mmol/L (ref 3.5–5.1)
SODIUM: 139 mmol/L (ref 135–145)
Total Protein: 7.2 g/dL (ref 6.5–8.1)

## 2014-09-27 LAB — LIPASE, BLOOD: Lipase: 16 U/L — ABNORMAL LOW (ref 22–51)

## 2014-09-27 MED ORDER — IBUPROFEN 600 MG PO TABS
600.0000 mg | ORAL_TABLET | Freq: Once | ORAL | Status: AC
Start: 2014-09-27 — End: 2014-09-27
  Administered 2014-09-27: 600 mg via ORAL
  Filled 2014-09-27: qty 1

## 2014-09-27 MED ORDER — HYDROCHLOROTHIAZIDE 25 MG PO TABS
25.0000 mg | ORAL_TABLET | Freq: Every day | ORAL | Status: DC
Start: 2014-09-27 — End: 2015-03-23

## 2014-09-27 MED ORDER — CYCLOBENZAPRINE HCL 10 MG PO TABS: 10.0000 mg | ORAL_TABLET | Freq: Two times a day (BID) | ORAL | Status: DC | PRN

## 2014-09-27 MED ORDER — GI COCKTAIL ~~LOC~~
30.0000 mL | Freq: Once | ORAL | Status: AC
  Administered 2014-09-27: 30 mL via ORAL
  Filled 2014-09-27: qty 30

## 2014-09-27 NOTE — Discharge Instructions (Signed)
Primary Care Resources:  [facility]  [address] [place] [address] Ph [phone] Every 2nd Saturday 9am-12pm  [email or web address] FREE Services  - [facility]  [address] [place] Ph [phone]  [address] [place] Ph [phone]  [email or web address] $45 per visit/Walk-in only  - [facility]  [address] [place] [address] Ph [phone]  1st & 3rd Saturday of each month 9:30am-12:30pm [email or web address] Sliding fee scale/Call to make an appointment  - [facility]  [address] [place] Ph [phone]  Hours Mon-Fri 9am-7pm & Sat 9am-1pm [email or web address] Visits start at $45 per visit/Call to make an appointment  [facility]  [address] [place] [address] Ph [phone]  Hours Mon-Wed 8:30am-5pm & Thurs 8:30am-8pm $5 per visit/Call for an eligibility appointment    Abdominal Pain Many things can cause belly (abdominal) pain. Most times, the belly pain is not dangerous. Many cases of belly pain can be watched and treated at home. HOME CARE   Do not take medicines that help you go poop (laxatives) unless told to by your doctor.  Only take medicine as told by your doctor.  Eat or drink as told by your doctor. Your doctor will tell you if you should be on a special diet. GET HELP IF:  You do not know what is causing your belly pain.  You have belly pain while you are sick to your stomach (nauseous) or have runny poop (diarrhea).  You have pain while you pee or poop.  Your belly pain wakes you up at night.  You have belly pain that gets worse or better when you eat.  You have belly pain that gets worse when you eat fatty foods.  You have a fever. GET HELP RIGHT AWAY IF:   The pain does not go away within 2 hours.  You keep throwing up (vomiting).  The pain changes and is only in the right  or left part of the belly.  You have bloody or tarry looking poop. MAKE SURE YOU:   Understand these instructions.  Will watch your condition.  Will get help right away if you are not doing well or get worse. Document Released: 09/19/2007 Document Revised: 04/07/2013 Document Reviewed: 12/10/2012 [facility] Patient Information 2015 [place], [place]. This information is not intended to replace advice given to you by your health care provider. Make sure you discuss any questions you have with your health care provider.

## 2014-09-27 NOTE — MAU Provider Note (Signed)
History     CSN: [id number]  Arrival date and time: 09/26/14 2328   First Provider Initiated Contact with Patient 09/27/14 0058      Chief Complaint  Patient presents with  . Abdominal Pain  . Possible Pregnancy   HPI  [name] is a [id number] here with report of mid abdominal & low back pain since Friday night. Pain is described as an achy pain.  Pain is rated an 8/10.  Denies vaginal bleeding or vaginal discharge. LMP 07/27/14. Pregnancy test in May that was negative.  Past Medical History  Diagnosis Date  . Asthma   . Hypertension     Past Surgical History  Procedure Laterality Date  . No past surgeries      Family History  Problem Relation Age of Onset  . Hypertension Mother   . Breast cancer Maternal Grandmother   . Hypertension Maternal Grandmother   . Breast cancer Paternal Grandmother     History  Substance Use Topics  . Smoking status: Never Smoker   . Smokeless tobacco: Not on file  . Alcohol Use: Yes     Comment: socially    Allergies:  Allergies  Allergen Reactions  . Amoxicillin Other (See Comments)    Unknown childhood reaction.    Prescriptions prior to admission  Medication Sig Dispense Refill Last Dose  . Ibuprofen-Diphenhydramine Cit 200-38 MG TABS Take by mouth.   More than a month at Unknown time  . naproxen (NAPROSYN) 500 MG tablet Take 1 tablet (500 mg total) by mouth 2 (two) times daily. 15 tablet 0 More than a month at Unknown time    Review of Systems  Eyes: Negative for blurred vision and double vision.  Gastrointestinal: Positive for nausea and abdominal pain. Negative for vomiting, diarrhea and constipation (last bm today).  Genitourinary: Negative for dysuria, urgency, frequency and hematuria.  Neurological: Negative for headaches.  All other systems reviewed and are negative.  Physical Exam   Blood pressure 142/102, pulse 67, temperature 98.2 F (36.8 C), temperature source Oral, resp. rate 16, height   (1.702 m), weight 110.768 kg (244 lb 3.2 oz), last menstrual period 07/27/2014, SpO2 100 %.  Physical Exam  Constitutional: She is oriented to person, place, and time. She appears well-developed and well-nourished. No distress.  HENT:  Head: Normocephalic.  Eyes: EOM are normal. Pupils are equal, round, and reactive to light.  Neck: Normal range of motion. Neck supple.  Cardiovascular: Normal rate, regular rhythm and normal heart sounds.   Respiratory: Effort normal and breath sounds normal. No respiratory distress.  GI: Soft. Bowel sounds are normal. There is no hepatosplenomegaly. There is tenderness (mid epigastric area). There is no rebound. No hernia. Hernia confirmed negative in the ventral area.  Musculoskeletal: Normal range of motion. She exhibits no edema.  Neurological: She is alert and oriented to person, place, and time. She has normal reflexes.  Skin: Skin is warm and dry.    MAU Course  Procedures  0205 Pt reports improvement in upper abdominal pain; continues to have lower back pain 0206 Ibuprofen 600 mg ordered  Results for orders placed or performed during the hospital encounter of 09/26/14 (from the past 24 hour(s))  Urinalysis, Routine w reflex microscopic (not at [facility])     Status: Abnormal   Collection Time: 09/26/14 11:48 PM  Result Value Ref Range   Color, Urine YELLOW YELLOW   APPearance CLEAR CLEAR   Specific Gravity, Urine >1.030 (H) 1.005 - 1.030  pH 6.0 5.0 - 8.0   Glucose, UA NEGATIVE NEGATIVE mg/dL   Hgb urine dipstick NEGATIVE NEGATIVE   Bilirubin Urine NEGATIVE NEGATIVE   Ketones, ur NEGATIVE NEGATIVE mg/dL   Protein, ur NEGATIVE NEGATIVE mg/dL   Urobilinogen, UA 0.2 0.0 - 1.0 mg/dL   Nitrite NEGATIVE NEGATIVE   Leukocytes, UA NEGATIVE NEGATIVE  Pregnancy, urine POC     Status: None   Collection Time: 09/26/14 11:57 PM  Result Value Ref Range   Preg Test, Ur NEGATIVE NEGATIVE  CBC     Status: Abnormal   Collection Time: 09/27/14  1:10 AM   Result Value Ref Range   WBC 11.6 (H) 4.0 - 10.5 K/uL   RBC 4.37 3.87 - 5.11 MIL/uL   Hemoglobin 12.8 12.0 - 15.0 g/dL   HCT 00.8 67.6 - 19.5 %   MCV 86.5 78.0 - 100.0 fL   MCH 29.3 26.0 - 34.0 pg   MCHC 33.9 30.0 - 36.0 g/dL   RDW 09.3 26.7 - 12.4 %   Platelets 173 150 - 400 K/uL  Amylase     Status: None   Collection Time: 09/27/14  1:10 AM  Result Value Ref Range   Amylase 56 28 - 100 U/L  Lipase, blood     Status: Abnormal   Collection Time: 09/27/14  1:10 AM  Result Value Ref Range   Lipase 16 (L) 22 - 51 U/L    Assessment and Plan  Abdominal Pain - unknown etiology Hypertension  Plan: Discharge to home RX HCTZ 25 mg QD Follow-up with PCP [facility] or [facility] if pain worsens or does not improve List of PCPs provided  Marlis Edelson, CNM

## 2015-03-04 ENCOUNTER — Emergency Department (HOSPITAL_COMMUNITY): Payer: No Typology Code available for payment source

## 2015-03-04 ENCOUNTER — Encounter (HOSPITAL_COMMUNITY): Payer: Self-pay | Admitting: Emergency Medicine

## 2015-03-04 ENCOUNTER — Emergency Department (HOSPITAL_COMMUNITY)
Admission: EM | Admit: 2015-03-04 | Discharge: 2015-03-04 | Disposition: A | Discharge status: Home / Self Care | Payer: No Typology Code available for payment source | Attending: Emergency Medicine | Admitting: Emergency Medicine

## 2015-03-04 DIAGNOSIS — R079 Chest pain, unspecified: Secondary | ICD-10-CM | POA: Insufficient documentation

## 2015-03-04 DIAGNOSIS — I10 Essential (primary) hypertension: Secondary | ICD-10-CM | POA: Insufficient documentation

## 2015-03-04 DIAGNOSIS — R55 Syncope and collapse: Secondary | ICD-10-CM | POA: Insufficient documentation

## 2015-03-04 DIAGNOSIS — R002 Palpitations: Secondary | ICD-10-CM | POA: Insufficient documentation

## 2015-03-04 DIAGNOSIS — J45901 Unspecified asthma with (acute) exacerbation: Secondary | ICD-10-CM | POA: Insufficient documentation

## 2015-03-04 DIAGNOSIS — R110 Nausea: Secondary | ICD-10-CM | POA: Insufficient documentation

## 2015-03-04 LAB — BASIC METABOLIC PANEL
Anion gap: 8 (ref 5–15)
BUN: 16 mg/dL (ref 6–20)
CHLORIDE: 103 mmol/L (ref 101–111)
CO2: 28 mmol/L (ref 22–32)
CREATININE: 0.96 mg/dL (ref 0.44–1.00)
Calcium: 9.5 mg/dL (ref 8.9–10.3)
GFR calc Af Amer: >60 mL/min (ref >60)
GFR calc non Af Amer: >60 mL/min (ref >60)
GLUCOSE: 84 mg/dL (ref 65–99)
POTASSIUM: 3.9 mmol/L (ref 3.5–5.1)
Sodium: 139 mmol/L (ref 135–145)

## 2015-03-04 LAB — CBC
HEMATOCRIT: 41.8 % (ref 36.0–46.0)
Hemoglobin: 13.9 g/dL (ref 12.0–15.0)
MCH: 29.4 pg (ref 26.0–34.0)
MCHC: 33.3 g/dL (ref 30.0–36.0)
MCV: 88.6 fL (ref 78.0–100.0)
PLATELETS: 207 10*3/uL (ref 150–400)
RBC: 4.72 MIL/uL (ref 3.87–5.11)
RDW: 12.8 % (ref 11.5–15.5)
WBC: 9.2 10*3/uL (ref 4.0–10.5)

## 2015-03-04 LAB — I-STAT TROPONIN, ED: Troponin i, poc: 0 ng/mL (ref 0.00–0.08)

## 2015-03-04 NOTE — ED Notes (Signed)
Pt. reports palpitations with chest pain , chest congestion / SOB onset today .

## 2015-03-04 NOTE — ED Provider Notes (Signed)
CSN: [id number]     Arrival date & time 03/04/15  0003 History  By signing my name below, I, Bailey Carney, attest that this documentation has been prepared under the direction and in the presence of Bailey Rhineonald Jaionna Weisse, MD. Electronically Signed: Tanda RockersMargaux Carney, ED Scribe. 03/04/2015. 2:03 AM.   Chief Complaint  Patient presents with  . Palpitations  . Chest Pain   Patient is a 24 y.o. female presenting with chest pain. The history is provided by the patient. No language interpreter was used.  Chest Pain Pain location:  Substernal area Pain quality: pressure   Pain radiates to:  Does not radiate Pain radiates to the back: no   Timing:  Unable to specify Progression:  Resolved Chronicity:  New Context: at rest   Relieved by:  None tried Worsened by:  Nothing tried Ineffective treatments:  None tried Associated symptoms: nausea, near-syncope and shortness of breath   Associated symptoms: no abdominal pain, no back pain, no cough, no diaphoresis, no fever, no syncope and not vomiting   Risk factors: hypertension   Risk factors: no birth control, no diabetes mellitus, no high cholesterol, not female, no prior DVT/PE and no surgery      HPI Comments: Bailey Carney is a 24 y.o. female who presents to the Emergency Department complaining of sudden onset, constant, pressure, mid chest pain that began this morning around 10:30 AM (approximately 15.5 hours ago). The pain lasted about 30 minutes before subsiding on its own. She felt nauseous with shortness of breath with the chest pain. Pt states that throughout the day her heart was intermittently "fluttering", worsening around 9 PM tonight. Pt also felt pre syncopal with the heart fluttering. She has never had symptoms like this in the past. Denies fever, vomiting, abdominal pain, back pain, tingling, numbness, weakness, or any other associated symptoms. Pt is occasional smoker. No illicit drug use. No hx MI,CVA, DVT/PE. No hormonal intake. No  recent prolonged travel or surgery.   Past Medical History  Diagnosis Date  . Asthma   . Hypertension    Past Surgical History  Procedure Laterality Date  . No past surgeries     Family History  Problem Relation Age of Onset  . Hypertension Mother   . Breast cancer Maternal Grandmother   . Hypertension Maternal Grandmother   . Breast cancer Paternal Grandmother    Social History  Substance Use Topics  . Smoking status: Never Smoker   . Smokeless tobacco: None  . Alcohol Use: Yes     Comment: socially   OB History    Gravida Para Term Preterm AB TAB SAB Ectopic Multiple Living   0 0 0 0 0 0 0 0 0 0      Review of Systems  Constitutional: Negative for fever and diaphoresis.  Respiratory: Positive for shortness of breath. Negative for cough.   Cardiovascular: Positive for chest pain and near-syncope. Negative for syncope.       + Heart fluttering  Gastrointestinal: Positive for nausea. Negative for vomiting and abdominal pain.  Musculoskeletal: Negative for back pain.  Neurological: Negative for syncope.       + Pre syncope  All other systems reviewed and are negative.  Allergies  Amoxicillin  Home Medications   Prior to Admission medications   Medication Sig Start Date End Date Taking? Authorizing Provider  cyclobenzaprine (FLEXERIL) 10 MG tablet Take 1 tablet (10 mg total) by mouth 2 (two) times daily as needed for muscle spasms. Patient not taking: Reported  on 03/04/2015 09/27/14   Marlis Edelson, CNM  hydrochlorothiazide (HYDRODIURIL) 25 MG tablet Take 1 tablet (25 mg total) by mouth daily. Patient not taking: Reported on 03/04/2015 09/27/14   Marlis Edelson, CNM  naproxen (NAPROSYN) 500 MG tablet Take 1 tablet (500 mg total) by mouth 2 (two) times daily. Patient not taking: Reported on 03/04/2015 07/15/14   Kathrynn Speed, PA-C   Triage Vitals: BP 129/93 mmHg  Pulse 65  Temp(Src) 97.8 F (36.6 C) (Oral)  Resp 16  Ht  (1.702 m)  Wt 221 lb 2 oz (100.302  kg)  BMI 34.63 kg/m2  SpO2 98%  LMP 02/18/2015  Physical Exam  Nursing note and vitals reviewed.  CONSTITUTIONAL: Well developed/well nourished HEAD: Normocephalic/atraumatic EYES: EOMI/PERRL ENMT: Mucous membranes moist NECK: supple no meningeal signs SPINE/BACK:entire spine nontender CV: S1/S2 noted, no murmurs/rubs/gallops noted LUNGS: Lungs are clear to auscultation bilaterally, no apparent distress ABDOMEN: soft, nontender, no rebound or guarding, bowel sounds noted throughout abdomen GU:no cva tenderness NEURO: Pt is awake/alert/appropriate, moves all extremitiesx4.  No facial droop.   EXTREMITIES: pulses normal/equal, full ROM, no calf tenderness or edema noted SKIN: warm, color normal PSYCH: no abnormalities of mood noted, alert and oriented to situation  ED Course  Procedures   DIAGNOSTIC STUDIES: Oxygen Saturation is 98% on RA, normal by my interpretation.    COORDINATION OF CARE: 2:02 AM-Discussed treatment plan with pt at bedside and pt agreed to plan.  Pt improved and requesting d/c home She is well appearing She appears PERC negative I doubt ACS Denies syncope with palpitations Advised to cut back on caffeine and cut back on smoking Discussed strict return precautions Referred to cardiology  Labs Review Labs Reviewed  BASIC METABOLIC PANEL  CBC  I-STAT TROPOININ, ED    Imaging Review Dg Chest 2 View  03/04/2015  CLINICAL DATA:  Woke up with central chest pain, fluttering sensation this afternoon. History of hypertension and asthma. EXAM: CHEST  2 VIEW COMPARISON:  Chest radiograph December 22, 2013 FINDINGS: Cardiomediastinal silhouette is normal. The lungs are clear without pleural effusions or focal consolidations. Trachea projects midline and there is no pneumothorax. Soft tissue planes and included osseous structures are non-suspicious. IMPRESSION: Normal chest. Electronically Signed   By: Awilda Metro M.D.   On: 03/04/2015 00:55   I have  personally reviewed and evaluated these images and lab results as part of my medical decision-making.   EKG Interpretation   Date/Time:  Friday March 04 2015 00:25:56 EST Ventricular Rate:  74 PR Interval:  150 QRS Duration: 86 QT Interval:  390 QTC Calculation: 432 R Axis:   80 Text Interpretation:  Normal sinus rhythm with sinus arrhythmia Normal ECG  No significant change since last tracing Confirmed by Bebe Shaggy  MD, Dorinda Hill  [phone]) on 03/04/2015 12:33:34 AM      MDM   Final diagnoses:  Palpitations  Chest pain, unspecified chest pain type    Nursing notes including past medical history and social history reviewed and considered in documentation xrays/imaging reviewed by myself and considered during evaluation Labs/vital reviewed myself and considered during evaluation   I personally performed the services described in this documentation, which was scribed in my presence. The recorded information has been reviewed and is accurate.       Bailey Rhine, MD 03/04/15 [phone]

## 2015-03-04 NOTE — ED Notes (Signed)
Discharge instructions reviewed - voiced understanding.  Instructed her to call MD for an appointment

## 2015-03-04 NOTE — ED Notes (Signed)
Patient presents stating that this morning she woke up "fluttering in her chest".  Stated then she had some chest pain off and on.  Denies N/V/SOB  Stated when this happened in the past it was her asthma.

## 2015-03-04 NOTE — ED Notes (Signed)
Dr Wickline in to see patient.  

## 2015-03-22 ENCOUNTER — Telehealth: Payer: Self-pay | Admitting: Cardiology

## 2015-03-22 DIAGNOSIS — J45909 Unspecified asthma, uncomplicated: Secondary | ICD-10-CM | POA: Insufficient documentation

## 2015-03-22 DIAGNOSIS — I10 Essential (primary) hypertension: Secondary | ICD-10-CM | POA: Insufficient documentation

## 2015-03-23 ENCOUNTER — Encounter: Payer: Self-pay | Admitting: Cardiology

## 2015-03-23 ENCOUNTER — Ambulatory Visit (INDEPENDENT_AMBULATORY_CARE_PROVIDER_SITE_OTHER): Payer: Self-pay | Admitting: Cardiology

## 2015-03-23 VITALS — BP 152/96 | HR 57 | Ht 66.0 in | Wt 220.9 lb

## 2015-03-23 DIAGNOSIS — R42 Dizziness and giddiness: Secondary | ICD-10-CM

## 2015-03-23 DIAGNOSIS — R002 Palpitations: Secondary | ICD-10-CM | POA: Insufficient documentation

## 2015-03-23 MED ORDER — AMLODIPINE BESYLATE 2.5 MG PO TABS: 2.5000 mg | ORAL_TABLET | Freq: Every day | ORAL | Status: DC

## 2015-03-23 NOTE — Progress Notes (Signed)
Cardiology Office Note   Date:  03/23/2015   ID:  Bailey BudgeBrooke K Neace, DOB 1991/02/02, MRN [id number]  PCP:  No PCP Per Patient  Cardiologist:   Rollene RotundaJames Nayla Dias, MD   Chief Complaint  Patient presents with  . Presyncope  . Chest Pain  . Palpitations      History of Present Illness: Bailey Carney is a 24 y.o. female who presents for evaluation of presyncope, palpitations, chest discomfort. She was at a funeral earlier this week. She developed some abdominal discomfort and cramping and she got very lightheaded. She had to leave funeral. She had presyncope and diaphoresis. She thought her symptoms lasted approximately 30-45 minutes. She did not lose consciousness. She has not had this kind of episode before or since. She was in the emergency room in mid November for some tachycardia palpitations and some chest discomfort but this was somewhat atypical. She really can't quantify or qualify chest discomfort but does say she occasionally feels fluttering in her chest. She walks a great deal at work but she doesn't exercise because she's also a Physicist, medicalfull-time student. With this she denies any shortness of breath. She doesn't have any resting shortness of breath, PND or orthopnea. She's had no weight gain or edema. Of note she's had hypertension for some time but she took herself off medications previous.  Past Medical History  Diagnosis Date  . Asthma   . Hypertension     Past Surgical History  Procedure Laterality Date  . No past surgeries       Current Outpatient Prescriptions  Medication Sig Dispense Refill  . amLODipine (NORVASC) 2.5 MG tablet Take 1 tablet (2.5 mg total) by mouth daily. 30 tablet 6   No current facility-administered medications for this visit.    Allergies:   Amoxicillin    Social History:  The patient  reports that she has never smoked. She does not have any smokeless tobacco history on file. She reports that she drinks alcohol. She reports that she does not use  illicit drugs.   Family History:  The patient's family history includes Breast cancer in her maternal grandmother and paternal grandmother; Hypertension in her maternal grandmother and mother.    ROS:  Please see the history of present illness.   Otherwise, review of systems are positive for none.   All other systems are reviewed and negative.    PHYSICAL EXAM: VS:  BP 152/96 mmHg  Pulse 57  Ht 5\' 6"  (1.676 m)  Wt 220 lb 14.4 oz (100.2 kg)  BMI 35.67 kg/m2  LMP 02/18/2015 , BMI Body mass index is 35.67 kg/(m^2). GENERAL:  Well appearing HEENT:  Pupils equal round and reactive, fundi not visualized, oral mucosa unremarkable NECK:  No jugular venous distention, waveform within normal limits, carotid upstroke brisk and symmetric, no bruits, no thyromegaly LYMPHATICS:  No cervical, inguinal adenopathy LUNGS:  Clear to auscultation bilaterally BACK:  No CVA tenderness CHEST:  Unremarkable HEART:  PMI not displaced or sustained,S1 and S2 within normal limits, no S3, no S4, no clicks, no rubs, no murmurs ABD:  Flat, positive bowel sounds normal in frequency in pitch, no bruits, no rebound, no guarding, no midline pulsatile mass, no hepatomegaly, no splenomegaly EXT:  2 plus pulses throughout, no edema, no cyanosis no clubbing SKIN:  No rashes no nodules NEURO:  Cranial nerves II through XII grossly intact, motor grossly intact throughout PSYCH:  Cognitively intact, oriented to person place and time    EKG:  EKG  is ordered today. The ekg ordered today demonstrates sinus rhythm with sinus arrhythmia, axis within normal limits, intervals within normal, no acute ST-T wave changes.   Recent Labs: 09/27/2014: ALT 13* 03/04/2015: BUN 16; Creatinine, Ser 0.96; Hemoglobin 13.9; Platelets 207; Potassium 3.9; Sodium 139    Lipid Panel No results found for: CHOL, TRIG, HDL, CHOLHDL, VLDL, LDLCALC, LDLDIRECT    Wt Readings from Last 3 Encounters:  03/23/15 220 lb 14.4 oz (100.2 kg)  03/04/15  221 lb 2 oz (100.302 kg)  09/26/14 244 lb 3.2 oz (110.768 kg)      Other studies Reviewed: Additional studies/ records that were reviewed today include: ED records. Review of the above records demonstrates:  Please see elsewhere in the note.     ASSESSMENT AND PLAN:  PRESYNCOPE:  I suspect that this was a vagal episode. Because of her palpitations I would like to try to get an event monitor. Further evaluation will be based on these results.  CHEST PAIN:  Her pain was very atypical. She does not have significant cardiovascular risk factors of hypertension. I think the pretest probability of obstructive coronary disease is very low. No further cardiovascular testing for this would be suggested.  HTN:  We talked about therapeutic lifestyle changes to include salt restriction, walking and weight loss. In the meantime she needs blood pressure control and I will prescribe Norvasc 2.5 mg daily. We talked about how she might keep track and diary of her blood pressure.  OVERWEIGHT:  The patient needs weight loss diet and exercise.    Current medicines are reviewed at length with the patient today.  The patient does not have concerns regarding medicines.  The following changes have been made:  no change  Labs/ tests ordered today include:   Orders Placed This Encounter  Procedures  . EKG 12-Lead     Disposition:   FU with as needed.      Signed, Rollene Rotunda, MD  03/23/2015 4:03 PM    [facility]

## 2015-03-23 NOTE — Patient Instructions (Signed)
Your physician recommends that you schedule a follow-up appointment in: As Needed  Your physician has recommended you make the following change in your medication: START Amlodipine 2.5 mg daily  Please call [employer] for approval on monitor Phone # [phone], then give me a call and let me know, [facility] [phone]

## 2015-03-23 NOTE — Telephone Encounter (Signed)
Close encounter 

## 2015-04-13 ENCOUNTER — Encounter (HOSPITAL_COMMUNITY): Payer: Self-pay | Admitting: *Deleted

## 2015-04-13 ENCOUNTER — Inpatient Hospital Stay (HOSPITAL_COMMUNITY)
Admission: AD | Admit: 2015-04-13 | Discharge: 2015-04-13 | Disposition: A | Discharge status: Home / Self Care | Payer: Self-pay | Source: Ambulatory Visit | Attending: Obstetrics & Gynecology | Admitting: Obstetrics & Gynecology

## 2015-04-13 DIAGNOSIS — J45909 Unspecified asthma, uncomplicated: Secondary | ICD-10-CM | POA: Insufficient documentation

## 2015-04-13 DIAGNOSIS — L538 Other specified erythematous conditions: Secondary | ICD-10-CM

## 2015-04-13 DIAGNOSIS — R21 Rash and other nonspecific skin eruption: Secondary | ICD-10-CM | POA: Insufficient documentation

## 2015-04-13 DIAGNOSIS — R109 Unspecified abdominal pain: Secondary | ICD-10-CM | POA: Insufficient documentation

## 2015-04-13 DIAGNOSIS — R1013 Epigastric pain: Secondary | ICD-10-CM

## 2015-04-13 DIAGNOSIS — I10 Essential (primary) hypertension: Secondary | ICD-10-CM | POA: Insufficient documentation

## 2015-04-13 HISTORY — DX: Herpesviral infection of urogenital system, unspecified: A60.00

## 2015-04-13 LAB — URINALYSIS, ROUTINE W REFLEX MICROSCOPIC
Bilirubin Urine: NEGATIVE
Glucose, UA: NEGATIVE mg/dL
HGB URINE DIPSTICK: NEGATIVE
Ketones, ur: NEGATIVE mg/dL
Leukocytes, UA: NEGATIVE
Nitrite: NEGATIVE
PH: 6 (ref 5.0–8.0)
PROTEIN: NEGATIVE mg/dL
Specific Gravity, Urine: 1.015 (ref 1.005–1.030)

## 2015-04-13 LAB — POCT PREGNANCY, URINE: Preg Test, Ur: NEGATIVE

## 2015-04-13 MED ORDER — TRIAMCINOLONE ACETONIDE 0.1 % EX CREA: 1.0000 "application " | TOPICAL_CREAM | Freq: Two times a day (BID) | CUTANEOUS | Status: DC

## 2015-04-13 NOTE — Discharge Instructions (Signed)

## 2015-04-13 NOTE — MAU Note (Signed)
Pt has numerous raised red circular areas on both arms, both legs, & her back.  Pt states these areas hurt & itch.  Pt has used calamine lotion & taking benadryl, is not helping.

## 2015-04-13 NOTE — MAU Note (Signed)
Pt presents to MAU with complaints of lower abdominal pain on and off since December the 5th. Notice rash on arms and legs since Monday.

## 2015-04-13 NOTE — MAU Provider Note (Signed)
[name]  Chief Complaint:  Abdominal Pain and Rash   Bailey Carney is a 24 y.o.  G0P0000  presenting for Abdominal Pain and Rash  Pain present for approximately 3 weeks. Mid abdomen shooting down. New pain. Comes and goes. Not every day. No dysuria or frequency. No back pain or fevers. No vaginal itching, burning, or change in discharge.  Rash began 2 days ago. On arms and legs. Never had before. Itches. No new lesions. Nothing on palms or soles, nothing on genitals, nothing on mouth. No outdoor exposures. No new medications. No arthralgias or fevers. No new detergents. Lesions do not migrate. No lip or throat swelling, no sob, no diarrhea. Was prescribed norvasc recently but didn't start taking.  Past Medical History  Diagnosis Date  . Asthma   . Hypertension   . Genital herpes     Past Surgical History  Procedure Laterality Date  . No past surgeries      Family History  Problem Relation Age of Onset  . Hypertension Mother   . Breast cancer Maternal Grandmother   . Hypertension Maternal Grandmother   . Breast cancer Paternal Grandmother     Social History  Substance Use Topics  . Smoking status: Never Smoker   . Smokeless tobacco: None  . Alcohol Use: Yes     Comment: socially    Allergies  Allergen Reactions  . Amoxicillin Other (See Comments)    Has patient had a PCN reaction causing immediate rash, facial/tongue/throat swelling, SOB or lightheadedness with hypotension: Yes Has patient had a PCN reaction causing severe rash involving mucus membranes or skin necrosis: No Has patient had a PCN reaction that required hospitalization No Has patient had a PCN reaction occurring within the last 10 years: No If all of the above answers are "NO", then may proceed with Cephalosporin use.     Prescriptions prior to admission  Medication Sig Dispense Refill Last Dose  . diphenhydrAMINE (BENADRYL) 25 MG tablet Take 25 mg by mouth every 6 (six) hours as  needed.   04/13/2015 at Unknown time  . amLODipine (NORVASC) 2.5 MG tablet Take 1 tablet (2.5 mg total) by mouth daily. (Patient not taking: Reported on 04/13/2015) 30 tablet 6     Review of Systems - Negative except for what is mentioned in HPI.  Physical Exam  Blood pressure 136/76, pulse 64, temperature 97.9 F (36.6 C), temperature source Oral, resp. rate 18, last menstrual period 03/15/2015. GENERAL: Well-developed, well-nourished female in no acute distress.  LUNGS: Clear to auscultation bilaterally.  HEART: Regular rate and rhythm. ABDOMEN: Soft, nontender, nondistended, gravid.  EXTREMITIES: Nontender, no edema, 2+ distal pulses. Skin: scattered erythematous macules, some mildly excoriated, no scale, on arms and legs   Labs: Results for orders placed or performed during the hospital encounter of 04/13/15 (from the past 24 hour(s))  Urinalysis, Routine w reflex microscopic (not at [facility])   Collection Time: 04/13/15  4:02 PM  Result Value Ref Range   Color, Urine YELLOW YELLOW   APPearance CLEAR CLEAR   Specific Gravity, Urine 1.015 1.005 - 1.030   pH 6.0 5.0 - 8.0   Glucose, UA NEGATIVE NEGATIVE mg/dL   Hgb urine dipstick NEGATIVE NEGATIVE   Bilirubin Urine NEGATIVE NEGATIVE   Ketones, ur NEGATIVE NEGATIVE mg/dL   Protein, ur NEGATIVE NEGATIVE mg/dL   Nitrite NEGATIVE NEGATIVE   Leukocytes, UA NEGATIVE NEGATIVE  Pregnancy, urine POC   Collection Time: 04/13/15  4:07 PM  Result Value Ref  Range   Preg Test, Ur NEGATIVE NEGATIVE    Imaging Studies:  No results found.  Assessment: Bailey Carney is  24 y.o. G0P0000 presenting with Abdominal Pain and Rash  # Abdominal pain - not present today, benign exam, normal vitals. Described as periumbilical and suprapubic. UA not suggestive of infection. No pelvic or vaginal complaints; upreg negative. Etiology is unclear, but as no pain now and benign exam, think unlikely of significant concern.  - abdominal pain return  precautions  # Rash - itchy macular rash on arms and legs. Etiology also unclear! No meds or other obvious exposures. Atopic dermatitis possible, but not scaly, which is typically seen. Almost appear hive-like, but rash has been fixed as is for 2 days, and no symptoms of anaphylaxis. No associated symptoms, which is reassuring - continue benadryl - triamcinolone topical - return precautions - offered derm referral, patient declined   Silvano Bilisoah B Dejia Ebron 12/28/20165:04 PM

## 2015-05-31 ENCOUNTER — Encounter (HOSPITAL_COMMUNITY): Payer: Self-pay

## 2015-05-31 ENCOUNTER — Emergency Department (HOSPITAL_COMMUNITY)
Admission: EM | Admit: 2015-05-31 | Discharge: 2015-05-31 | Disposition: A | Discharge status: Home / Self Care | Payer: Self-pay | Attending: Emergency Medicine | Admitting: Emergency Medicine

## 2015-05-31 DIAGNOSIS — Y998 Other external cause status: Secondary | ICD-10-CM | POA: Insufficient documentation

## 2015-05-31 DIAGNOSIS — Z7952 Long term (current) use of systemic steroids: Secondary | ICD-10-CM | POA: Insufficient documentation

## 2015-05-31 DIAGNOSIS — Y9389 Activity, other specified: Secondary | ICD-10-CM | POA: Insufficient documentation

## 2015-05-31 DIAGNOSIS — J45909 Unspecified asthma, uncomplicated: Secondary | ICD-10-CM | POA: Insufficient documentation

## 2015-05-31 DIAGNOSIS — Y9289 Other specified places as the place of occurrence of the external cause: Secondary | ICD-10-CM | POA: Insufficient documentation

## 2015-05-31 DIAGNOSIS — I10 Essential (primary) hypertension: Secondary | ICD-10-CM | POA: Insufficient documentation

## 2015-05-31 DIAGNOSIS — Z23 Encounter for immunization: Secondary | ICD-10-CM | POA: Insufficient documentation

## 2015-05-31 DIAGNOSIS — Z8619 Personal history of other infectious and parasitic diseases: Secondary | ICD-10-CM | POA: Insufficient documentation

## 2015-05-31 DIAGNOSIS — W57XXXA Bitten or stung by nonvenomous insect and other nonvenomous arthropods, initial encounter: Secondary | ICD-10-CM | POA: Insufficient documentation

## 2015-05-31 DIAGNOSIS — Z880 Allergy status to penicillin: Secondary | ICD-10-CM | POA: Insufficient documentation

## 2015-05-31 DIAGNOSIS — L03113 Cellulitis of right upper limb: Secondary | ICD-10-CM | POA: Insufficient documentation

## 2015-05-31 MED ORDER — CEPHALEXIN 500 MG PO CAPS: 500.0000 mg | ORAL_CAPSULE | Freq: Four times a day (QID) | ORAL | Status: DC

## 2015-05-31 MED ORDER — TETANUS-DIPHTH-ACELL PERTUSSIS 5-2.5-18.5 LF-MCG/0.5 IM SUSP
0.5000 mL | Freq: Once | INTRAMUSCULAR | Status: AC
  Administered 2015-05-31: 0.5 mL via INTRAMUSCULAR
  Filled 2015-05-31: qty 0.5

## 2015-05-31 MED ORDER — CEPHALEXIN 250 MG PO CAPS
500.0000 mg | ORAL_CAPSULE | Freq: Once | ORAL | Status: AC
  Administered 2015-05-31: 500 mg via ORAL
  Filled 2015-05-31 (×2): qty 2

## 2015-05-31 MED ORDER — ACETAMINOPHEN 325 MG PO TABS
650.0000 mg | ORAL_TABLET | Freq: Once | ORAL | Status: AC
  Administered 2015-05-31: 650 mg via ORAL
  Filled 2015-05-31: qty 2

## 2015-05-31 NOTE — ED Notes (Signed)
Pharmacy spoke to mother who reports allergy to Amoxicillin was a rash. Okay to administer Keflex.

## 2015-05-31 NOTE — Discharge Instructions (Signed)
Take acetaminophen (Tylenol) up to 975 mg (this is normally 3 over-the-counter pills) up to 3 times a day. Do not drink alcohol. Make sure your other medications do not contain acetaminophen (Read the labels!)  Take your antibiotics as directed and to completion. You should never have any leftover antibiotics! Push fluids and stay well hydrated.   Any antibiotic use can reduce the efficacy of hormonal birth control. Please use back up method of contraception.   If you see worsening signs of infection (warmth, redness, tenderness, pus, sharp increase in pain, fever, red streaking) immediately return to the emergency department.  Do not hesitate to return to the emergency room for any new, worsening or concerning symptoms.  Please obtain primary care using resource guide below. Let them know that you were seen in the emergency room and that they will need to obtain records for further outpatient management.   Cellulitis Cellulitis is an infection of the skin and the tissue under the skin. The infected area is usually red and tender. This happens most often in the arms and lower legs. HOME CARE   Take your antibiotic medicine as told. Finish the medicine even if you start to feel better.  Keep the infected arm or leg raised (elevated).  Put a warm cloth on the area up to 4 times per day.  Only take medicines as told by your doctor.  Keep all doctor visits as told. GET HELP IF:  You see red streaks on the skin coming from the infected area.  Your red area gets bigger or turns a dark color.  Your bone or joint under the infected area is painful after the skin heals.  Your infection comes back in the same area or different area.  You have a puffy (swollen) bump in the infected area.  You have new symptoms.  You have a fever. GET HELP RIGHT AWAY IF:   You feel very sleepy.  You throw up (vomit) or have watery poop (diarrhea).  You feel sick and have muscle aches and pains.     This information is not intended to replace advice given to you by your health care provider. Make sure you discuss any questions you have with your health care provider.   Document Released: 09/19/2007 Document Revised: 12/22/2014 Document Reviewed: 06/18/2011 Elsevier Interactive Patient Education 2016 [employer].    Emergency Department Resource Guide 1) Find a Doctor and Pay Out of Pocket Although you won't have to find out who is covered by your insurance plan, it is a good idea to ask around and get recommendations. You will then need to call the office and see if the doctor you have chosen will accept you as a new patient and what types of options they offer for patients who are self-pay. Some doctors offer discounts or will set up payment plans for their patients who do not have insurance, but you will need to ask so you aren't surprised when you get to your appointment.  2) Contact Your Local Health Department Not all health departments have doctors that can see patients for sick visits, but many do, so it is worth a call to see if yours does. If you don't know where your local health department is, you can check in your phone book. The CDC also has a tool to help you locate your state's health department, and many state websites also have listings of all of their local health departments.  3) Find a Walk-in Clinic If your illness  is not likely to be very severe or complicated, you may want to try a walk in clinic. These are popping up all over the country in pharmacies, drugstores, and shopping centers. They're usually staffed by nurse practitioners or physician assistants that have been trained to treat common illnesses and complaints. They're usually fairly quick and inexpensive. However, if you have serious medical issues or chronic medical problems, these are probably not your best option.  No Primary Care Doctor: - Call Health Connect at  [phone] - they can help you locate a  primary care doctor that  accepts your insurance, provides certain services, etc. - Physician Referral Service- [phone]  Chronic Pain Problems: Organization         Address  Phone   Notes  [facility]  [phone] Patients need to be referred by their primary care doctor.   Medication Assistance: Organization         Address  Phone   Notes  [facility] Medication [facility] [address] [place] [place], [place] [address] [phone] --Must be a resident of [facility] -- Must have NO insurance coverage whatsoever (no Medicaid/ Medicare, etc.) -- The pt. MUST have a primary care doctor that directs their care regularly and follows them in the community   [facility]ist  [phone]   [employer]  [phone]    Agencies that provide inexpensive medical care: Organization         Address  Phone   Notes  [facility]  [phone]   [facility]    [phone]   [facility] [address] [place], [place] [address] [phone]   [facility] [address] [place] [place] [phone]   Planned Parenthood    [phone]   [facility]    [phone]   [facility]  [address], [place] Phone:  [phone], Fax:  [phone] Hours of Operation:  9 am - 6 pm, M-F.  Also accepts Medicaid/Medicare and self-pay.  [facility]  [address], [place] Phone: [phone], Fax: [phone]. Hours of Operation:  8:30 am - 5:30 pm, M-F.  Also accepts Medicaid and self-pay.  [facility] [address], [place] Phone: [phone]   [facility] [address] Natasha Bence [place], [place] [phone], Ext. [phone] Mondays & Thursdays: 7-9 AM.  First 15 patients are seen on a first come, first serve basis.    Medicaid-accepting [facility]  Providers:  Organization         Address  Phone   Notes  [facility] [address], [place] [phone] Also accepts self-pay patients.  [facility] [address] Laurell Josephs [place], [place]  [phone]   [facility] [address], [place] [phone]   [facility] [address] [place] [phone]   Renaye Rakers [address], [place]   [phone] Only accepts [place] Access [place] patients after they have their name applied to their card.   Self-Pay (no insurance) in [facility]:  Organization         Address  Phone   Notes  Sickle Cell Patients, [facility] [address] [place], [place] [phone]  [facility] Urgent Care [place] [phone]   [facility] [place]  [place], [place] [phone]   [facility]/Dr. Osei-Bonsu  [address] [place] or [address], [place] [phone] Phone number for both [place] and [place] locations is the same.  [facility] [address], [place] [phone]   [facility] [address], [place] or [address] [phone]   [facility] [address], [place] [phone], phone; [phone], fax Sees patients 1st and 3rd Saturday of every month.  Must not qualify for public or private insurance (i.e. Medicaid, Medicare, [place] Health Choice, Veterans' Benefits)  Household income should be no more than 200% of the poverty level The clinic cannot treat you if you are pregnant or think you are pregnant  Sexually transmitted diseases are not treated at the clinic.    Dental Care: Organization         Address  Phone  Notes  [facility] [place] [phone] Accepts children up to age 34 who are enrolled in [place] or [place]; pregnant women with a Medicaid card; and children who have applied for Medicaid or [place] Health Choice, but were declined, whose parents can pay a reduced fee at time of service.  [facility]  [address], [place] [phone] Accepts children up to age 34 who are enrolled in [place] or [place]; pregnant women with a Medicaid card; and children who have applied for Medicaid or  Health Choice, but were declined, whose parents can pay a reduced fee at time of service.  [facility]  [place] [phone] Patients are seen by appointment only. Walk-ins are not accepted. [place] will see patients 69 years of age and older. Monday - Tuesday (8am-5pm) Most Wednesdays (8:30-5pm) $30 per visit, cash only  [facility] Adult [facility] Access PROGRAM  [address], [facility] [phone] Patients are seen by appointment only. Walk-ins are not accepted. [place] will see patients 28 years of age and older. One Wednesday Evening (Monthly: Volunteer Based).  $30 per visit, cash only  [place]  [phone] for adults; Children under age 32, call Graduate Pediatric Dentistry at [phone]. Children aged 77-14, please call [phone] to request a pediatric application.  Dental services are provided in all areas of dental care including fillings, crowns and bridges, complete and partial dentures, implants, gum treatment, root canals, and extractions. Preventive care is also provided. Treatment is provided to both adults and children. Patients are selected via a lottery and there is often a waiting list.   [facility] [address] [place]  [phone] [email or web address]   [facility]  [address] [place], [place] [phone], Ext. [phone] Second and Fourth Thursday of each month, opens at 6:30 AM; Clinic ends at 9 AM.  Patients are seen on a first-come first-served basis, and a limited number are seen during each clinic.   [facility]  [address] Hillard Danker [place], [place] [phone]   Eligibility Requirements You must have lived in [place], [place], or [place] counties for at least the  last three months.   You cannot be eligible for state or federal sponsored [employer], including [employer], [place], or [employer].   You generally cannot be eligible for healthcare insurance through your employer.    How to apply: Eligibility screenings are held every Tuesday and Wednesday afternoon from 1:00 pm until 4:00 pm. You do not need an appointment for the interview!  [facility] [address], [place], [place]   [place]  [place] Department  [place]  [phone]    Behavioral Health Resources in the Community: Intensive Outpatient Programs Organization         Address  Phone  Notes  [facility] [place]. [address], [place], [place] [phone]   [facility] [address] [place], [place]   ADS: [facility] [address], [place], [place]   [place] [address]  [place], [place] or [phone]   Substance Abuse Resources Organization         Address  Phone  Notes  Al[facility] Drug Services  [phone]   [place]  [phone]   [facility] [place]   Chinita Pester  [phone]   Residential & Outpatient Substance Abuse Program  [phone]   Psychological Services Organization         Address  Phone  Notes  [facility] [place]  [place]  [phone]   [place] [address], [place] or [phone]    Mobile Crisis Teams Organization         Address  Phone  Notes  [facility], Mobile Crisis Care Unit  [phone]   [facility]  [address] [place], [place]   [place] Levels [address], [place] [phone]    Self-Help/Support Groups Organization         Address  Phone             Notes  [place]. of [place] - variety of support groups  [place] Call for more information  Narcotics Anonymous (NA), [facility] [address], [employer] [place]  2 meetings at this location   Special educational needs teacher         Address  Phone  Notes  [facility]idential [facility] [place],    [place]  [phone]   [facility]  [address], [place] [phone], [place], [place]   [place], [place] [phone] Admissions: 8am-3pm M-F  Incentives Substance [place] [address]    [place], [place] [phone]   [facility] [address] [place], [place], [place]   [facility] [address]  [place], [place]   [facility] Programs - Intensive Outpatient [address] Krist[address], [place], [place]   [facility] ([place].,  [place], [place] [phone] or [phone]   [facility] (RTS) [address], [place], [place] Accepts Medicaid  [facility] [address]  [place] [phone] Substance Abuse/Addiction Treatment   [facility] Organization         Address  Phone  Notes  [facility]  [phone]   [facility]myra Free  Alvan Dame, PhD [address] Ervin Knack [place], [place]   [phone] or [phone]    [facility] Behavioral   [address] [place], [place] [phone]   [facility] [address], [place], [place] [phone] Insurance/Medicaid/sponsorship through [facility] and Families [address]                                    [place], [place] [phone] Therapy/tele-psych/case  [facility] [address] [place], [place] [phone]    Dr. Lolly Mustache  [phone]   [facility] [place]  [employer] [facility] Dept. 1) [address] [place] 2) [address], [place] 3)  [address], [place] [phone]  [phone]   [facility] Child Abuse Hotline [phone])[phone] or [phone] (After Hours)

## 2015-05-31 NOTE — ED Provider Notes (Signed)
CSN: [id number]     Arrival date & time 05/31/15  1717 History  By signing my name below, I, Bailey Carney, attest that this documentation has been prepared under the direction and in the presence of [employer], PA-C. Electronically Signed: [name], ED Scribe. 05/31/2015. 5:42 PM.   Chief Complaint  Patient presents with  . Insect Bite   The history is provided by the patient. No language interpreter was used.   HPI Comments: Bailey Carney is a 25 y.o. female with an hx of asthma and HTN who presents to the Emergency Department complaining of constantly painful, 6/10 severity, insect bite to the back of the upper right arm that occurred 2 days ago. Patient reports that the area was itchy at first but that this itchiness has since progressed to pain. She endorses associated redness and swelling around the insect area. She has not taken any medications. She denies hx of DM. Patient denies fevers, nausea, and vomiting. 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  Past Medical History  Diagnosis Date  . Asthma   . Hypertension   . Genital herpes    Past Surgical History  Procedure Laterality Date  . No past surgeries     Family History  Problem Relation Age of Onset  . Hypertension Mother   . Breast cancer Maternal Grandmother   . Hypertension Maternal Grandmother   . Breast cancer Paternal Grandmother    Social History  Substance Use Topics  . Smoking status: Never Smoker   . Smokeless tobacco: None  . Alcohol Use: Yes     Comment: socially   OB History    Gravida Para Term Preterm AB TAB SAB Ectopic Multiple Living       Review of Systems A complete 10 system review of systems was obtained and all systems are negative except as noted in the HPI and PMH.    Allergies  Amoxicillin  Home Medications   Prior to Admission medications   Medication Sig Start Date End Date Taking? Authorizing Provider  amLODipine (NORVASC) 2.5 MG  tablet Take 1 tablet (2.5 mg total) by mouth daily. Patient not taking: Reported on 04/13/2015 03/23/15   Rollene Rotunda, MD  diphenhydrAMINE (BENADRYL) 25 MG tablet Take 25 mg by mouth every 6 (six) hours as needed.    Historical Provider, MD  triamcinolone cream (KENALOG) 0.1 % Apply 1 application topically 2 (two) times daily. 04/13/15   Kathrynn Running, MD   BP 132/82 mmHg  Pulse 89  Temp(Src) 98.3 F (36.8 C) (Oral)  Resp 20  Ht  (1.702 m)  Wt 220 lb (99.791 kg)  BMI 34.45 kg/m2  SpO2 98%  LMP 05/18/2015 (Within Days) Physical Exam  Constitutional: She is oriented to person, place, and time. She appears well-developed and well-nourished. No distress.  HENT:  Head: Normocephalic and atraumatic.  Eyes: Conjunctivae and EOM are normal.  Neck: Neck supple. No tracheal deviation present.  Cardiovascular: Normal rate.   Pulmonary/Chest: Effort normal. No respiratory distress.  Musculoskeletal: Normal range of motion.  Full active range of motion to left elbow, distally neurovascularly intact.  Neurological: She is alert and oriented to person, place, and time.  Skin: Skin is warm and dry.     10 x 15 cm cellulitis with induration. No fluctuance.  Psychiatric: She has a normal mood and affect. Her behavior is normal.  Nursing note and vitals reviewed.   ED Course  Procedures   DIAGNOSTIC STUDIES: Oxygen Saturation is 98% on RA, normal by my interpretation.    COORDINATION OF CARE: 5:36 PM Will order Tylenol and antibiotics. Advised pt of return precautions. Discussed treatment plan with pt at bedside and pt agreed to plan.  5:50 PM Discussed pt's amoxicillin allergy with the pharmacist who spoke with the pt's mother, clarifying that the allergic reaction was just a rash.   Labs Review Labs Reviewed - No data to display  Imaging Review No results found. I have personally reviewed and evaluated these images and lab results as part of my medical  decision-making.   EKG Interpretation None      MDM   Final diagnoses:  Cellulitis of right upper extremity    Filed Vitals:   05/31/15 1732  BP: 132/82  Pulse: 89  Temp: 98.3 F (36.8 C)  TempSrc: Oral  Resp: 20  Height:  (1.702 m)  Weight: 99.791 kg  SpO2: 98%    Medications  cephALEXin (KEFLEX) capsule 500 mg (500 mg Oral Given 05/31/15 1750)  acetaminophen (TYLENOL) tablet 650 mg (650 mg Oral Given 05/31/15 1742)  Tdap (BOOSTRIX) injection 0.5 mL (0.5 mLs Intramuscular Given 05/31/15 1832)    Bailey Carney is 25 y.o. female presenting with cellulitis to right arm. This appears to be very superficial, she has full range of motion to the elbow, I don't think this is extending into the joint. Patient is afebrile, no systemic signs of infection. Area is outlined, tetanus is updated and patient will be started on Keflex.   Evaluation does not show pathology that would require ongoing emergent intervention or inpatient treatment. Pt is hemodynamically stable and mentating appropriately. Discussed findings and plan with patient/guardian, who agrees with care plan. All questions answered. Return precautions discussed and outpatient follow up given.   Discharge Medication List as of 05/31/2015  5:58 PM    START taking these medications   Details  cephALEXin (KEFLEX) 500 MG capsule Take 1 capsule (500 mg total) by mouth 4 (four) times daily., Starting 05/31/2015, Until Discontinued, Print         I personally performed the services described in this documentation, which was scribed in my presence. The recorded information has been reviewed and is accurate.    Wynetta Emery, PA-C 05/31/15 1856  Loren Racer, MD 06/01/15 [phone]

## 2015-05-31 NOTE — ED Notes (Signed)
Pt here for insect bite on right elbow region. Pt reports it has been there for 2 days. Redness and swelling noted around the site.

## 2015-10-29 ENCOUNTER — Emergency Department (HOSPITAL_COMMUNITY)
Admission: EM | Admit: 2015-10-29 | Discharge: 2015-10-29 | Disposition: A | Discharge status: Home / Self Care | Payer: Self-pay | Attending: Emergency Medicine | Admitting: Emergency Medicine

## 2015-10-29 ENCOUNTER — Encounter (HOSPITAL_COMMUNITY): Payer: Self-pay

## 2015-10-29 DIAGNOSIS — Y929 Unspecified place or not applicable: Secondary | ICD-10-CM | POA: Insufficient documentation

## 2015-10-29 DIAGNOSIS — I10 Essential (primary) hypertension: Secondary | ICD-10-CM | POA: Insufficient documentation

## 2015-10-29 DIAGNOSIS — J45909 Unspecified asthma, uncomplicated: Secondary | ICD-10-CM | POA: Insufficient documentation

## 2015-10-29 DIAGNOSIS — Y939 Activity, unspecified: Secondary | ICD-10-CM | POA: Insufficient documentation

## 2015-10-29 DIAGNOSIS — Y999 Unspecified external cause status: Secondary | ICD-10-CM | POA: Insufficient documentation

## 2015-10-29 DIAGNOSIS — Z79899 Other long term (current) drug therapy: Secondary | ICD-10-CM | POA: Insufficient documentation

## 2015-10-29 DIAGNOSIS — X58XXXA Exposure to other specified factors, initial encounter: Secondary | ICD-10-CM | POA: Insufficient documentation

## 2015-10-29 DIAGNOSIS — S0502XA Injury of conjunctiva and corneal abrasion without foreign body, left eye, initial encounter: Secondary | ICD-10-CM | POA: Insufficient documentation

## 2015-10-29 MED ORDER — LEVOFLOXACIN 0.5 % OP SOLN: 1.0000 [drp] | OPHTHALMIC | Status: DC

## 2015-10-29 MED ORDER — FLUORESCEIN SODIUM 1 MG OP STRP
1.0000 | ORAL_STRIP | Freq: Once | OPHTHALMIC | Status: AC
  Administered 2015-10-29: 1 via OPHTHALMIC
  Filled 2015-10-29: qty 1

## 2015-10-29 MED ORDER — TETRACAINE HCL 0.5 % OP SOLN
2.0000 [drp] | Freq: Once | OPHTHALMIC | Status: AC
  Administered 2015-10-29: 2 [drp] via OPHTHALMIC
  Filled 2015-10-29: qty 2

## 2015-10-29 NOTE — ED Notes (Signed)
Patient here with left eye irritation and tearing x 1 day, denies injury, light sensitivity. Thinks may be related to wearing her contacts.

## 2015-10-29 NOTE — Discharge Instructions (Signed)
Use the eyedrops as prescribed and do not wait your contact lens until you have finished with the eyedrops, 7 days. Follow up with an ophthalmologist within 2 days to have your eye reevaluated.   Return to emergency department if you experience worsening redness of the eye, swelling, foul discharge, pain with eye movement, visual changes, fevers or chills.  Corneal Abrasion The cornea is the clear covering at the front and center of the eye. When looking at the colored portion of the eye (iris), you are looking through the cornea. This very thin tissue is made up of many layers. The surface layer is a single layer of cells (corneal epithelium) and is one of the most sensitive tissues in the body. If a scratch or injury causes the corneal epithelium to come off, it is called a corneal abrasion. If the injury extends to the tissues below the epithelium, the condition is called a corneal ulcer. CAUSES   Scratches.  Trauma.  Foreign body in the eye. Some people have recurrences of abrasions in the area of the original injury even after it has healed (recurrent erosion syndrome). Recurrent erosion syndrome generally improves and goes away with time. SYMPTOMS   Eye pain.  Difficulty or inability to keep the injured eye open.  The eye becomes very sensitive to light.  Recurrent erosions tend to happen suddenly, first thing in the morning, usually after waking up and opening the eye. DIAGNOSIS  Your health care provider can diagnose a corneal abrasion during an eye exam. Dye is usually placed in the eye using a drop or a small paper strip moistened by your tears. When the eye is examined with a special light, the abrasion shows up clearly because of the dye. TREATMENT   Small abrasions may be treated with antibiotic drops or ointment alone.  A pressure patch may be put over the eye. If this is done, follow your doctor's instructions for when to remove the patch. Do not drive or use machines  while the eye patch is on. Judging distances is hard to do with a patch on. If the abrasion becomes infected and spreads to the deeper tissues of the cornea, a corneal ulcer can result. This is serious because it can cause corneal scarring. Corneal scars interfere with light passing through the cornea and cause a loss of vision in the involved eye. HOME CARE INSTRUCTIONS  Use medicine or ointment as directed. Only take over-the-counter or prescription medicines for pain, discomfort, or fever as directed by your health care provider.  Do not drive or operate machinery if your eye is patched. Your ability to judge distances is impaired.  If your health care provider has given you a follow-up appointment, it is very important to keep that appointment. Not keeping the appointment could result in a severe eye infection or permanent loss of vision. If there is any problem keeping the appointment, let your health care provider know. SEEK MEDICAL CARE IF:   You have pain, light sensitivity, and a scratchy feeling in one eye or both eyes.  Your pressure patch keeps loosening up, and you can blink your eye under the patch after treatment.  Any kind of discharge develops from the eye after treatment or if the lids stick together in the morning.  You have the same symptoms in the morning as you did with the original abrasion days, weeks, or months after the abrasion healed.   This information is not intended to replace advice given to you by  your health care provider. Make sure you discuss any questions you have with your health care provider.   Document Released: 03/30/2000 Document Revised: 12/22/2014 Document Reviewed: 12/08/2012 Elsevier Interactive Patient Education [employer]2016 Elsevier Inc.

## 2015-10-29 NOTE — ED Provider Notes (Signed)
CSN: [id number]     Arrival date & time 10/29/15  1114 History  By signing my name below, I, Freida Busman, attest that this documentation has been prepared under the direction and in the presence of non-physician practitioner, Mattie Marlin, PA-C. Electronically Signed: Freida Busman, Scribe. 10/29/2015. 12:05 PM.  Chief Complaint  Patient presents with  . Eye Pain   The history is provided by the patient. No language interpreter was used.    HPI Comments:  Bailey Carney is a 25 y.o. female who presents to the Emergency Department complaining of constant, left eye pain and irritation since yesterday. Pt also describes foreign body sensation and states her eye feels dry.  Pt reports associated photophobia and eye redness. She has applied eye drops without relief. Pt states she wears 30 day contacts;  states she hasn't changed the brand of contacts. She does note that she sleeps in the contacts and isn't sure if they are meant to be slept in. She also states she left this pair in longer than the recommended 30 days. Pt removed the contact from the left eye that is bothering her last night.  No alleviating factors noted. She denies fever, chills, nausea, and vomiting.    Past Medical History  Diagnosis Date  . Asthma   . Hypertension   . Genital herpes    Past Surgical History  Procedure Laterality Date  . No past surgeries     Family History  Problem Relation Age of Onset  . Hypertension Mother   . Breast cancer Maternal Grandmother   . Hypertension Maternal Grandmother   . Breast cancer Paternal Grandmother    Social History  Substance Use Topics  . Smoking status: Never Smoker   . Smokeless tobacco: None  . Alcohol Use: Yes     Comment: socially   OB History    Gravida Para Term Preterm AB TAB SAB Ectopic Multiple Living       Review of Systems  Constitutional: Negative for fever and chills.  Eyes: Positive for photophobia, pain and redness.   Gastrointestinal: Negative for nausea and vomiting.  Neurological: Negative for headaches.   Allergies  Amoxicillin  Home Medications   Prior to Admission medications   Medication Sig Start Date End Date Taking? Authorizing Provider  amLODipine (NORVASC) 2.5 MG tablet Take 1 tablet (2.5 mg total) by mouth daily. Patient not taking: Reported on 04/13/2015 03/23/15   Rollene Rotunda, MD  cephALEXin (KEFLEX) 500 MG capsule Take 1 capsule (500 mg total) by mouth 4 (four) times daily. 05/31/15   Nicole Pisciotta, PA-C  diphenhydrAMINE (BENADRYL) 25 MG tablet Take 25 mg by mouth every 6 (six) hours as needed.    Historical Provider, MD  levofloxacin Charlean Sanfilippo) 0.5 % ophthalmic solution Place 1 drop into the left eye every 2 (two) hours. While awake for 2 days then every 4-6 hrs for 5 days, do not wear contact lenses 10/29/15   Jerre Simon, PA  triamcinolone cream (KENALOG) 0.1 % Apply 1 application topically 2 (two) times daily. 04/13/15   Kathrynn Running, MD   BP 148/94 mmHg  Pulse 84  Temp(Src) 98.4 F (36.9 C) (Oral)  Resp 14  SpO2 100% Physical Exam  Constitutional: She appears well-developed and well-nourished. No distress.  HENT:  Head: Normocephalic and atraumatic.  Eyes: EOM are normal. Pupils are equal, round, and reactive to light. Lids are everted and swept, no foreign bodies found.  Right eye exhibits no chemosis, no discharge and no exudate. No foreign body present in the right eye. Left eye exhibits discharge. Left eye exhibits no chemosis and no exudate. No foreign body present in the left eye. Right conjunctiva is not injected. Right conjunctiva has no hemorrhage. Left conjunctiva is injected. Left conjunctiva has no hemorrhage. No scleral icterus.    IOC right eye 17  IOC left eye  14 Corneal abrasion noted to left medial upper eye seen with fluorescein staining, no foreign bodies noted  Pulmonary/Chest: Effort normal. No respiratory distress.  Musculoskeletal: Normal  range of motion.  Neurological: She is alert. Coordination normal.  Skin: Skin is warm and dry. She is not diaphoretic.  Psychiatric: She has a normal mood and affect. Her behavior is normal.  Nursing note and vitals reviewed.   ED Course  Procedures  DIAGNOSTIC STUDIES:  Oxygen Saturation is 100% on RA, normal by my interpretation.    COORDINATION OF CARE:  12:03 PM Pt instructed not to wear contacts until abrasion resolves.  Discussed treatment plan with pt at bedside and pt agreed to plan.  Labs Review Labs Reviewed - No data to display  Imaging Review No results found. I have personally reviewed and evaluated these images and lab results as part of my medical decision-making.   EKG Interpretation None      MDM   Final diagnoses:  Corneal abrasion, left, initial encounter   Corneal abrasion  Pt with corneal abrasion on PE. Tdap not needed at this time, pt states she had one within the last yeat. No evidence of FB.  Unable to assess visual acuity as pt does not have glasses and is not wearing contacts.  Pt is a contact lens wearer.  Exam non-concerning for orbital cellulitis, hyphema, corneal ulcers. Patient will be discharged home with Levofloxacin drops.   Patient understands to follow up with ophthalmology, & to return to ER if new symptoms develop including change in vision, purulent drainage, or entrapment.Discussed strict return precautions. Patient expressed understanding the discharge instructions.  I personally performed the services described in this documentation, which was scribed in my presence. The recorded information has been reviewed and is accurate.     Jerre SimonJessica L Ryken Paschal, PA 10/29/15 1307  Glynn OctaveStephen Rancour, MD 10/29/15 [phone]

## 2015-10-29 NOTE — ED Notes (Signed)
Unable to perform Visual Acuity d/t pt not wearing contact and does not have eyeglasses.

## 2016-03-06 ENCOUNTER — Encounter (HOSPITAL_COMMUNITY): Payer: Self-pay | Admitting: Emergency Medicine

## 2016-03-06 ENCOUNTER — Emergency Department (HOSPITAL_COMMUNITY)
Admission: EM | Admit: 2016-03-06 | Discharge: 2016-03-06 | Disposition: A | Discharge status: Home / Self Care | Payer: Self-pay | Attending: Emergency Medicine | Admitting: Emergency Medicine

## 2016-03-06 DIAGNOSIS — R197 Diarrhea, unspecified: Secondary | ICD-10-CM | POA: Insufficient documentation

## 2016-03-06 DIAGNOSIS — R112 Nausea with vomiting, unspecified: Secondary | ICD-10-CM | POA: Insufficient documentation

## 2016-03-06 DIAGNOSIS — Z5321 Procedure and treatment not carried out due to patient leaving prior to being seen by health care provider: Secondary | ICD-10-CM | POA: Insufficient documentation

## 2016-03-06 DIAGNOSIS — I10 Essential (primary) hypertension: Secondary | ICD-10-CM | POA: Insufficient documentation

## 2016-03-06 DIAGNOSIS — J45909 Unspecified asthma, uncomplicated: Secondary | ICD-10-CM | POA: Insufficient documentation

## 2016-03-06 LAB — URINE MICROSCOPIC-ADD ON: RBC / HPF: NONE SEEN RBC/hpf (ref 0–5)

## 2016-03-06 LAB — COMPREHENSIVE METABOLIC PANEL
ALK PHOS: 37 U/L — AB (ref 38–126)
ALT: 15 U/L (ref 14–54)
ANION GAP: 7 (ref 5–15)
AST: 22 U/L (ref 15–41)
Albumin: 4 g/dL (ref 3.5–5.0)
BILIRUBIN TOTAL: 0.7 mg/dL (ref 0.3–1.2)
BUN: 10 mg/dL (ref 6–20)
CALCIUM: 9.2 mg/dL (ref 8.9–10.3)
CO2: 26 mmol/L (ref 22–32)
CREATININE: 0.75 mg/dL (ref 0.44–1.00)
Chloride: 101 mmol/L (ref 101–111)
GFR calc non Af Amer: >60 mL/min (ref >60)
Glucose, Bld: 85 mg/dL (ref 65–99)
Potassium: 4.2 mmol/L (ref 3.5–5.1)
Sodium: 134 mmol/L — ABNORMAL LOW (ref 135–145)
TOTAL PROTEIN: 6.9 g/dL (ref 6.5–8.1)

## 2016-03-06 LAB — CBC WITH DIFFERENTIAL/PLATELET
Basophils Absolute: 0 10*3/uL (ref 0.0–0.1)
Basophils Relative: 0 %
Eosinophils Absolute: 0 10*3/uL (ref 0.0–0.7)
Eosinophils Relative: 0 %
HEMATOCRIT: 41.7 % (ref 36.0–46.0)
HEMOGLOBIN: 14 g/dL (ref 12.0–15.0)
LYMPHS ABS: 2.9 10*3/uL (ref 0.7–4.0)
LYMPHS PCT: 31 %
MCH: 29.6 pg (ref 26.0–34.0)
MCHC: 33.6 g/dL (ref 30.0–36.0)
MCV: 88.2 fL (ref 78.0–100.0)
MONOS PCT: 5 %
Monocytes Absolute: 0.5 10*3/uL (ref 0.1–1.0)
NEUTROS ABS: 6 10*3/uL (ref 1.7–7.7)
Neutrophils Relative %: 64 %
Platelets: 176 10*3/uL (ref 150–400)
RBC: 4.73 MIL/uL (ref 3.87–5.11)
RDW: 12.8 % (ref 11.5–15.5)
WBC: 9.3 10*3/uL (ref 4.0–10.5)

## 2016-03-06 LAB — URINALYSIS, ROUTINE W REFLEX MICROSCOPIC
BILIRUBIN URINE: NEGATIVE
GLUCOSE, UA: NEGATIVE mg/dL
Hgb urine dipstick: NEGATIVE
KETONES UR: NEGATIVE mg/dL
NITRITE: NEGATIVE
PH: 8 (ref 5.0–8.0)
PROTEIN: NEGATIVE mg/dL
Specific Gravity, Urine: 1.021 (ref 1.005–1.030)

## 2016-03-06 LAB — POC URINE PREG, ED: Preg Test, Ur: NEGATIVE

## 2016-03-06 NOTE — ED Triage Notes (Signed)
Pt c/o nausea, vomiting and diarrhea onset yesterday

## 2016-03-27 ENCOUNTER — Encounter (HOSPITAL_COMMUNITY): Payer: Self-pay | Admitting: *Deleted

## 2016-03-27 ENCOUNTER — Inpatient Hospital Stay (HOSPITAL_COMMUNITY)
Admission: AD | Admit: 2016-03-27 | Discharge: 2016-03-27 | Disposition: A | Discharge status: Home / Self Care | Payer: Self-pay | Source: Ambulatory Visit | Attending: Family Medicine | Admitting: Family Medicine

## 2016-03-27 DIAGNOSIS — Z880 Allergy status to penicillin: Secondary | ICD-10-CM | POA: Insufficient documentation

## 2016-03-27 DIAGNOSIS — R102 Pelvic and perineal pain: Secondary | ICD-10-CM | POA: Insufficient documentation

## 2016-03-27 LAB — WET PREP, GENITAL
Clue Cells Wet Prep HPF POC: NONE SEEN
SPERM: NONE SEEN
Trich, Wet Prep: NONE SEEN
YEAST WET PREP: NONE SEEN

## 2016-03-27 LAB — POCT PREGNANCY, URINE: PREG TEST UR: NEGATIVE

## 2016-03-27 LAB — HIV ANTIBODY (ROUTINE TESTING W REFLEX): HIV SCREEN 4TH GENERATION: NONREACTIVE

## 2016-03-27 LAB — URINALYSIS, ROUTINE W REFLEX MICROSCOPIC
BILIRUBIN URINE: NEGATIVE
Glucose, UA: NEGATIVE mg/dL
HGB URINE DIPSTICK: NEGATIVE
KETONES UR: NEGATIVE mg/dL
Leukocytes, UA: NEGATIVE
NITRITE: NEGATIVE
PROTEIN: NEGATIVE mg/dL
Specific Gravity, Urine: 1.025 (ref 1.005–1.030)
pH: 6 (ref 5.0–8.0)

## 2016-03-27 MED ORDER — IBUPROFEN 600 MG PO TABS: 600.0000 mg | ORAL_TABLET | Freq: Four times a day (QID) | ORAL | 0 refills | Status: DC | PRN

## 2016-03-27 MED ORDER — KETOROLAC TROMETHAMINE 60 MG/2ML IM SOLN
60.0000 mg | Freq: Once | INTRAMUSCULAR | Status: AC
  Administered 2016-03-27: 60 mg via INTRAMUSCULAR
  Filled 2016-03-27: qty 2

## 2016-03-27 NOTE — MAU Provider Note (Signed)
Chief Complaint: Pelvic Pain   None     SUBJECTIVE HPI: Bailey Carney is a 25 y.o. G0P0000 who presents to maternity admissions reporting onset of severe lower abdominal pain in the suprapubic area starting at 1 am, approximately 1 hour after intercourse with her boyfriend. She has never had this pain before. The pain is constant but worse with certain positions and with movement.  She has not tried any treatments. She reports she is in a monogamous relationship x 3 years. She denies vaginal bleeding, vaginal itching/burning, urinary symptoms, h/a, dizziness, n/v, or fever/chills.     HPI  Past Medical History:  Diagnosis Date  . Asthma   . Genital herpes   . Hypertension    Past Surgical History:  Procedure Laterality Date  . NO PAST SURGERIES     Social History   Social History  . Marital status: Single    Spouse name: N/A  . Number of children: N/A  . Years of education: N/A   Occupational History  . Not on file.   Social History Main Topics  . Smoking status: Never Smoker  . Smokeless tobacco: Never Used  . Alcohol use Yes     Comment: socially  . Drug use: No  . Sexual activity: Yes    Birth control/ protection: Condom   Other Topics Concern  . Not on file   Social History Narrative   Lives with sister    No current facility-administered medications on file prior to encounter.    Current Outpatient Prescriptions on File Prior to Encounter  Medication Sig Dispense Refill  . amLODipine (NORVASC) 2.5 MG tablet Take 1 tablet (2.5 mg total) by mouth daily. (Patient not taking: Reported on 03/27/2016) 30 tablet 6  . cephALEXin (KEFLEX) 500 MG capsule Take 1 capsule (500 mg total) by mouth 4 (four) times daily. (Patient not taking: Reported on 03/27/2016) 20 capsule 0  . levofloxacin (QUIXIN) 0.5 % ophthalmic solution Place 1 drop into the left eye every 2 (two) hours. While awake for 2 days then every 4-6 hrs for 5 days, do not wear contact lenses (Patient not  taking: Reported on 03/27/2016) 5 mL 0  . triamcinolone cream (KENALOG) 0.1 % Apply 1 application topically 2 (two) times daily. (Patient not taking: Reported on 03/27/2016) 80 g 1   Allergies  Allergen Reactions  . Amoxicillin Rash and Other (See Comments)    Has patient had a PCN reaction causing immediate rash, facial/tongue/throat swelling, SOB or lightheadedness with hypotension: No Has patient had a PCN reaction causing severe rash involving mucus membranes or skin necrosis: No Has patient had a PCN reaction that required hospitalization No Has patient had a PCN reaction occurring within the last 10 years: No If all of the above answers are "NO", then may proceed with Cephalosporin use.     ROS:  Review of Systems  Constitutional: Negative for chills, fatigue and fever.  Respiratory: Negative for shortness of breath.   Cardiovascular: Negative for chest pain.  Gastrointestinal: Positive for abdominal pain.  Genitourinary: Positive for pelvic pain. Negative for difficulty urinating, dysuria, flank pain, vaginal bleeding, vaginal discharge and vaginal pain.  Neurological: Negative for dizziness and headaches.  Psychiatric/Behavioral: Negative.      I have reviewed patient's Past Medical Hx, Surgical Hx, Family Hx, Social Hx, medications and allergies.   Physical Exam   Patient Vitals for the past 24 hrs:  BP Temp Pulse Resp Weight  03/27/16 0647 92/75 98.5 F (36.9 C) 79  18 219 lb (99.3 kg)   Constitutional: Well-developed, well-nourished female in no acute distress.  Cardiovascular: normal rate Respiratory: normal effort GI: Abd soft, non-tender. Pos BS x 4 MS: Extremities nontender, no edema, normal ROM Neurologic: Alert and oriented x 4.  GU: Neg CVAT.  PELVIC EXAM: Cervix pink, visually closed, without lesion, scant white creamy discharge, vaginal walls and external genitalia normal Bimanual exam: Cervix 0/long/high, firm, anterior, Positive CMT, uterus tender,  nonenlarged, adnexa without tenderness, enlargement, or mass    LAB RESULTS Results for orders placed or performed during the hospital encounter of 03/27/16 (from the past 24 hour(s))  Pregnancy, urine POC     Status: None   Collection Time: 03/27/16  6:42 AM  Result Value Ref Range   Preg Test, Ur NEGATIVE NEGATIVE  Urinalysis, Routine w reflex microscopic     Status: None   Collection Time: 03/27/16  6:43 AM  Result Value Ref Range   Color, Urine YELLOW YELLOW   APPearance CLEAR CLEAR   Specific Gravity, Urine 1.025 1.005 - 1.030   pH 6.0 5.0 - 8.0   Glucose, UA NEGATIVE NEGATIVE mg/dL   Hgb urine dipstick NEGATIVE NEGATIVE   Bilirubin Urine NEGATIVE NEGATIVE   Ketones, ur NEGATIVE NEGATIVE mg/dL   Protein, ur NEGATIVE NEGATIVE mg/dL   Nitrite NEGATIVE NEGATIVE   Leukocytes, UA NEGATIVE NEGATIVE  Wet prep, genital     Status: Abnormal   Collection Time: 03/27/16  8:40 AM  Result Value Ref Range   Yeast Wet Prep HPF POC NONE SEEN NONE SEEN   Trich, Wet Prep NONE SEEN NONE SEEN   Clue Cells Wet Prep HPF POC NONE SEEN NONE SEEN   WBC, Wet Prep HPF POC FEW (A) NONE SEEN   Sperm NONE SEEN        IMAGING No results found.  MAU Management/MDM: Ordered labs and reviewed results.  No acute abdomen no indication of PID or ovarian torsion on today's exam.  Will treat conservatively with NSAIDs with cultures pending.  Pt to f/u in [facility] routinely or return to MAU with worsening symptoms.  Treatments in MAU included Toradol 60 mg IM with some improvement in symptoms. Pt stable at time of discharge.  ASSESSMENT 1. Acute pelvic pain, female     PLAN Discharge home   Medication List    STOP taking these medications   amLODipine 2.5 MG tablet Commonly known as:  NORVASC   cephALEXin 500 MG capsule Commonly known as:  KEFLEX   levofloxacin 0.5 % ophthalmic solution Commonly known as:  QUIXIN   triamcinolone cream 0.1 % Commonly known as:  KENALOG     TAKE these  medications   ibuprofen 600 MG tablet Commonly known as:  ADVIL,MOTRIN Take 1 tablet (600 mg total) by mouth every 6 (six) hours as needed.      Follow-up Information    [facility]   Specialty:  Obstetrics and Gynecology Why:   Or OB/Gyn provider of you choice for routine Gyn care. Return to MAU if symptoms are worsening or for emergencies Contact information: [address] [place] [id number] [phone]          Sharen CounterLisa Leftwich-Kirby Certified Nurse-Midwife 03/27/2016  9:49 AM

## 2016-03-27 NOTE — Discharge Instructions (Signed)
Pelvic Pain, Female  Female pelvic pain can be caused by many different things and start from a variety of places. Pelvic pain refers to pain that is located in the lower half of the abdomen and between your hips. The pain may occur over a short period of time (acute) or may be reoccurring (chronic). The cause of pelvic pain may be related to disorders affecting the female reproductive organs (gynecologic), but it may also be related to the bladder, kidney stones, an intestinal complication, or muscle or skeletal problems. Getting help right away for pelvic pain is important, especially if there has been severe, sharp, or a sudden onset of unusual pain. It is also important to get help right away because some types of pelvic pain can be life threatening.   CAUSES   Below are only some of the causes of pelvic pain. The causes of pelvic pain can be in one of several categories.    Gynecologic.    Pelvic inflammatory disease.    Sexually transmitted infection.    Ovarian cyst or a twisted ovarian ligament (ovarian torsion).    Uterine lining that grows outside the uterus (endometriosis).    Fibroids, cysts, or tumors.    Ovulation.   Pregnancy.    Pregnancy that occurs outside the uterus (ectopic pregnancy).    Miscarriage.    Labor.    Abruption of the placenta or ruptured uterus.   Infection.    Uterine infection (endometritis).    Bladder infection.    Diverticulitis.    Miscarriage related to a uterine infection (septic abortion).   Bladder.    Inflammation of the bladder (cystitis).    Kidney stone(s).   Gastrointestinal.    Constipation.    Diverticulitis.   Neurologic.    Trauma.    Feeling pelvic pain because of mental or emotional causes (psychosomatic).   Cancers of the bowel or pelvis.  EVALUATION   Your caregiver will want to take a careful history of your concerns. This includes recent changes in your health, a careful gynecologic history of your periods (menses), and a sexual history. Obtaining  your family history and medical history is also important. Your caregiver may suggest a pelvic exam. A pelvic exam will help identify the location and severity of the pain. It also helps in the evaluation of which organ system may be involved. In order to identify the cause of the pelvic pain and be properly treated, your caregiver may order tests. These tests may include:    A pregnancy test.   Pelvic ultrasonography.   An X-ray exam of the abdomen.   A urinalysis or evaluation of vaginal discharge.   Blood tests.  HOME CARE INSTRUCTIONS    Only take over-the-counter or prescription medicines for pain, discomfort, or fever as directed by your caregiver.    Rest as directed by your caregiver.    Eat a balanced diet.    Drink enough fluids to make your urine clear or pale yellow, or as directed.    Avoid sexual intercourse if it causes pain.    Apply warm or cold compresses to the lower abdomen depending on which one helps the pain.    Avoid stressful situations.    Keep a journal of your pelvic pain. Write down when it started, where the pain is located, and if there are things that seem to be associated with the pain, such as food or your menstrual cycle.   Follow up with your caregiver as directed.     SEEK MEDICAL CARE IF:   Your medicine does not help your pain.   You have abnormal vaginal discharge.  SEEK IMMEDIATE MEDICAL CARE IF:    You have heavy bleeding from the vagina.    Your pelvic pain increases.    You feel light-headed or faint.    You have chills.    You have pain with urination or blood in your urine.    You have uncontrolled diarrhea or vomiting.    You have a fever or persistent symptoms for more than 3 days.   You have a fever and your symptoms suddenly get worse.    You are being physically or sexually abused.     This information is not intended to replace advice given to you by your health care provider. Make sure you discuss any questions you have with  your health care provider.     Document Released: 02/28/2004 Document Revised: 12/22/2014 Document Reviewed: 01/21/2015  Elsevier Interactive Patient Education 2017 [employer]

## 2016-03-27 NOTE — MAU Note (Signed)
Pt reports constant throbbing pain in suprapubic. Pt has had intercourse a few hours before and wonders if that had anything to do with this pain. She has never had this pain before.

## 2016-03-28 LAB — URINE CULTURE

## 2016-03-28 LAB — GC/CHLAMYDIA PROBE AMP (~~LOC~~) NOT AT ARMC
CHLAMYDIA, DNA PROBE: NEGATIVE
NEISSERIA GONORRHEA: NEGATIVE

## 2016-06-04 ENCOUNTER — Encounter (HOSPITAL_COMMUNITY): Payer: Self-pay | Admitting: Emergency Medicine

## 2016-06-04 ENCOUNTER — Emergency Department (HOSPITAL_COMMUNITY)
Admission: EM | Admit: 2016-06-04 | Discharge: 2016-06-04 | Disposition: A | Discharge status: Home / Self Care | Payer: Self-pay | Attending: Emergency Medicine | Admitting: Emergency Medicine

## 2016-06-04 DIAGNOSIS — I10 Essential (primary) hypertension: Secondary | ICD-10-CM | POA: Insufficient documentation

## 2016-06-04 DIAGNOSIS — J45909 Unspecified asthma, uncomplicated: Secondary | ICD-10-CM | POA: Insufficient documentation

## 2016-06-04 DIAGNOSIS — B349 Viral infection, unspecified: Secondary | ICD-10-CM | POA: Insufficient documentation

## 2016-06-04 MED ORDER — PSEUDOEPHEDRINE HCL 60 MG PO TABS: 60.0000 mg | ORAL_TABLET | Freq: Four times a day (QID) | ORAL | 0 refills | Status: DC | PRN

## 2016-06-04 MED ORDER — PROMETHAZINE-DM 6.25-15 MG/5ML PO SYRP: 5.0000 mL | ORAL_SOLUTION | Freq: Four times a day (QID) | ORAL | 0 refills | Status: DC | PRN

## 2016-06-04 MED ORDER — ACETAMINOPHEN 325 MG PO TABS
650.0000 mg | ORAL_TABLET | Freq: Once | ORAL | Status: AC
  Administered 2016-06-04: 650 mg via ORAL
  Filled 2016-06-04: qty 2

## 2016-06-04 NOTE — ED Triage Notes (Addendum)
Pt c/o generalized body aches, sore throat and congestion x's 5 days.  St's has not taken anything for symptoms.  Pt also c/o pain in left lower leg,.

## 2016-06-04 NOTE — ED Provider Notes (Signed)
[facility]-EMERGENCY DEPT Provider Note   CSN: [id number] Arrival date & time: 06/04/16  1505  By signing my name below, I, Bailey Carney, attest that this documentation has been prepared under the direction and in the presence of [name], NP. Electronically Signed: Linna Carney, Scribe. 06/04/2016. 5:27 PM.  History   Chief Complaint Chief Complaint  Patient presents with  . Generalized Body Aches    The history is provided by the patient. No language interpreter was used.  Influenza  Presenting symptoms: cough, fever, myalgias, rhinorrhea and sore throat   Presenting symptoms: no nausea and no vomiting   Severity:  Unable to specify Onset quality:  Unable to specify Duration:  5 days Progression:  Worsening Chronicity:  New Relieved by:  None tried Ineffective treatments:  Rest Associated symptoms: chills   Risk factors: not elderly, no diabetes problem, no heart disease, no immunocompromised state, no kidney disease, no liver disease, not pregnant and no sick contacts     HPI Comments: Bailey Carney is a 26 y.o. female with PMHx including asthma and HTN who presents to the Emergency Department complaining of constant, gradually worsening generalized body aches beginning 4 days ago. She reports associated sore throat, sneezing, rhinorrhea, intermittent fever/chills, and a cough that is occasionally productive with unspecified sputum. No medications or treatments tried since onset. Pt denies nausea, vomiting, abdominal pain, or any other associated symptoms.  Past Medical History:  Diagnosis Date  . Asthma   . Genital herpes   . Hypertension     Patient Active Problem List   Diagnosis Date Noted  . Palpitations 03/23/2015  . Dizziness 03/23/2015  . Asthma   . Hypertension   . Musculoskeletal chest pain 12/22/2013    Past Surgical History:  Procedure Laterality Date  . NO PAST SURGERIES      OB History    Gravida Para Term Preterm AB Living   0 0 0 0 0 0   SAB TAB Ectopic Multiple Live Births   0 0 0 0         Home Medications    Prior to Admission medications   Medication Sig Start Date End Date Taking? Authorizing Provider  ibuprofen (ADVIL,MOTRIN) 600 MG tablet Take 1 tablet (600 mg total) by mouth every 6 (six) hours as needed. 03/27/16   Hurshel Party, CNM    Family History Family History  Problem Relation Age of Onset  . Hypertension Mother   . Breast cancer Maternal Grandmother   . Hypertension Maternal Grandmother   . Breast cancer Paternal Grandmother     Social History Social History  Substance Use Topics  . Smoking status: Never Smoker  . Smokeless tobacco: Never Used  . Alcohol use Yes     Comment: socially     Allergies   Amoxicillin   Review of Systems Review of Systems  Constitutional: Positive for chills and fever.  HENT: Positive for rhinorrhea, sneezing and sore throat.   Respiratory: Positive for cough.   Gastrointestinal: Negative for abdominal pain, nausea and vomiting.  Musculoskeletal: Positive for myalgias.  All other systems reviewed and are negative.    Physical Exam Updated Vital Signs BP (!) 142/113 (BP Location: Left Arm)   Pulse 75   Temp 99.2 F (37.3 C) (Oral)   Resp 16   Ht 5\' 7"  (1.702 m)   Wt 215 lb (97.5 kg)   LMP 05/17/2016 (Exact Date)   SpO2 99%   BMI 33.67 kg/m   Physical Exam  Constitutional: She is oriented to person, place, and time. She appears well-developed and well-nourished. No distress.  HENT:  Head: Normocephalic and atraumatic.  Mouth/Throat: Posterior oropharyngeal erythema present.  Eyes: Conjunctivae and EOM are normal.  Neck: Neck supple. No tracheal deviation present.  Cardiovascular: Normal rate.   Pulmonary/Chest: Effort normal. No respiratory distress.  Musculoskeletal: Normal range of motion.  Neurological: She is alert and oriented to person, place, and time.  Skin: Skin is warm and dry.  Psychiatric: She has a normal mood and  affect. Her behavior is normal.  Nursing note and vitals reviewed.    ED Treatments / Results  Labs (all labs ordered are listed, but only abnormal results are displayed) Labs Reviewed - No data to display  EKG  EKG Interpretation None       Radiology No results found.  Procedures Procedures (including critical care time)  DIAGNOSTIC STUDIES: Oxygen Saturation is 99% on RA, normal by my interpretation.    COORDINATION OF CARE: 5:43 PM Discussed treatment plan with pt at bedside and pt agreed to plan.  Medications Ordered in ED Medications - No data to display   Initial Impression / Assessment and Plan / ED Course  I have reviewed the triage vital signs and the nursing notes.  Pertinent labs & imaging results that were available during my care of the patient were reviewed by me and considered in my medical decision making (see chart for details).     Pt symptoms consistent with URI. Pt will be discharged with symptomatic treatment.  Discussed return precautions.  Pt is hemodynamically stable & in NAD prior to discharge.  Final Clinical Impressions(s) / ED Diagnoses   Final diagnoses:  Viral illness    New Prescriptions New Prescriptions   PROMETHAZINE-DEXTROMETHORPHAN (PROMETHAZINE-DM) 6.25-15 MG/5ML SYRUP    Take 5 mLs by mouth 4 (four) times daily as needed for cough.   PSEUDOEPHEDRINE (SUDAFED) 60 MG TABLET    Take 1 tablet (60 mg total) by mouth every 6 (six) hours as needed for congestion.   I personally performed the services described in this documentation, which was scribed in my presence. The recorded information has been reviewed and is accurate.    Felicie Mornavid Adilenne Ashworth, NP 06/04/16 1757    Benjiman CoreNathan Pickering, MD 06/04/16 2026

## 2016-11-20 ENCOUNTER — Encounter (HOSPITAL_COMMUNITY): Payer: Self-pay

## 2016-11-20 ENCOUNTER — Emergency Department (HOSPITAL_COMMUNITY): Payer: Self-pay

## 2016-11-20 ENCOUNTER — Emergency Department (HOSPITAL_COMMUNITY)
Admission: EM | Admit: 2016-11-20 | Discharge: 2016-11-20 | Disposition: A | Discharge status: Home / Self Care | Payer: Self-pay | Attending: Physician Assistant | Admitting: Physician Assistant

## 2016-11-20 DIAGNOSIS — I10 Essential (primary) hypertension: Secondary | ICD-10-CM | POA: Insufficient documentation

## 2016-11-20 DIAGNOSIS — R519 Headache, unspecified: Secondary | ICD-10-CM

## 2016-11-20 DIAGNOSIS — R079 Chest pain, unspecified: Secondary | ICD-10-CM

## 2016-11-20 DIAGNOSIS — J45909 Unspecified asthma, uncomplicated: Secondary | ICD-10-CM | POA: Insufficient documentation

## 2016-11-20 DIAGNOSIS — R51 Headache: Secondary | ICD-10-CM | POA: Insufficient documentation

## 2016-11-20 LAB — I-STAT TROPONIN, ED: Troponin i, poc: 0 ng/mL (ref 0.00–0.08)

## 2016-11-20 LAB — CBC
HEMATOCRIT: 39.2 % (ref 36.0–46.0)
HEMOGLOBIN: 13.1 g/dL (ref 12.0–15.0)
MCH: 29.3 pg (ref 26.0–34.0)
MCHC: 33.4 g/dL (ref 30.0–36.0)
MCV: 87.7 fL (ref 78.0–100.0)
Platelets: 195 10*3/uL (ref 150–400)
RBC: 4.47 MIL/uL (ref 3.87–5.11)
RDW: 12.6 % (ref 11.5–15.5)
WBC: 7.9 10*3/uL (ref 4.0–10.5)

## 2016-11-20 LAB — BASIC METABOLIC PANEL
ANION GAP: 6 (ref 5–15)
BUN: 10 mg/dL (ref 6–20)
CHLORIDE: 103 mmol/L (ref 101–111)
CO2: 29 mmol/L (ref 22–32)
Calcium: 9.1 mg/dL (ref 8.9–10.3)
Creatinine, Ser: 0.86 mg/dL (ref 0.44–1.00)
GFR calc Af Amer: >60 mL/min (ref >60)
GLUCOSE: 89 mg/dL (ref 65–99)
POTASSIUM: 3.8 mmol/L (ref 3.5–5.1)
Sodium: 138 mmol/L (ref 135–145)

## 2016-11-20 MED ORDER — DIPHENHYDRAMINE HCL 50 MG/ML IJ SOLN
12.5000 mg | Freq: Once | INTRAMUSCULAR | Status: DC
  Filled 2016-11-20: qty 1

## 2016-11-20 MED ORDER — HYDROCHLOROTHIAZIDE 12.5 MG PO TABS: 12.5000 mg | ORAL_TABLET | Freq: Every day | ORAL | 1 refills | Status: DC

## 2016-11-20 MED ORDER — KETOROLAC TROMETHAMINE 30 MG/ML IJ SOLN
15.0000 mg | Freq: Once | INTRAMUSCULAR | Status: DC
  Filled 2016-11-20: qty 1

## 2016-11-20 MED ORDER — PROCHLORPERAZINE EDISYLATE 5 MG/ML IJ SOLN
5.0000 mg | Freq: Once | INTRAMUSCULAR | Status: DC
  Filled 2016-11-20: qty 2

## 2016-11-20 NOTE — ED Provider Notes (Signed)
[facility] Provider Note   CSN: [id number] Arrival date & time: 11/20/16  1546     History   Chief Complaint Chief Complaint  Patient presents with  . Chest Pain    HPI Waymon BudgeBrooke K Cagley is a 26 y.o. female who presents with c/o headache and chest pain. She states that she has been having intermittent chest pain and palpitations for 2 weeks. The patient denies sob, hemoptysis, UL leg swelling, trauma, confinement. She does not smoke or use estrogens. She denies orthopnea, PND. She has a hx of htn but does not take her medication and states that she "just didn't feel like it." She also complains of a headache. Which has been persistent for about 2 weeks. It is global, aching, not throbbing. She has been using Tylenol and Motrin. She denies photophobia, phonophobia, nausea, vomiting, neck stiffness, fevers. HPI  Past Medical History:  Diagnosis Date  . Asthma   . Genital herpes   . Hypertension     Patient Active Problem List   Diagnosis Date Noted  . Palpitations 03/23/2015  . Dizziness 03/23/2015  . Asthma   . Hypertension   . Musculoskeletal chest pain 12/22/2013    Past Surgical History:  Procedure Laterality Date  . NO PAST SURGERIES      OB History    Gravida Para Term Preterm AB Living   0 0 0 0 0 0   SAB TAB Ectopic Multiple Live Births   0 0 0 0         Home Medications    Prior to Admission medications   Medication Sig Start Date End Date Taking? Authorizing Provider  ibuprofen (ADVIL,MOTRIN) 600 MG tablet Take 1 tablet (600 mg total) by mouth every 6 (six) hours as needed. 03/27/16   Leftwich-Kirby, Wilmer FloorLisa A, CNM  promethazine-dextromethorphan (PROMETHAZINE-DM) 6.25-15 MG/5ML syrup Take 5 mLs by mouth 4 (four) times daily as needed for cough. 06/04/16   Felicie MornSmith, David, NP  pseudoephedrine (SUDAFED) 60 MG tablet Take 1 tablet (60 mg total) by mouth every 6 (six) hours as needed for congestion. 06/04/16   Felicie MornSmith, David, NP    Family History Family  History  Problem Relation Age of Onset  . Hypertension Mother   . Breast cancer Maternal Grandmother   . Hypertension Maternal Grandmother   . Breast cancer Paternal Grandmother     Social History Social History  Substance Use Topics  . Smoking status: Never Smoker  . Smokeless tobacco: Never Used  . Alcohol use Yes     Comment: socially     Allergies   Amoxicillin   Review of Systems Review of Systems Ten systems reviewed and are negative for acute change, except as noted in the HPI.   Physical Exam Updated Vital Signs BP (!) 142/102 (BP Location: Left Arm)   Pulse 81   Temp 98.1 F (36.7 C) (Oral)   Resp 16   Ht 5\' 6"  (1.676 m)   Wt 98.8 kg (217 lb 12.8 oz)   LMP 11/10/2016 (Within Days)   SpO2 96%   BMI 35.15 kg/m   Physical Exam  Constitutional: She is oriented to person, place, and time. She appears well-developed and well-nourished. No distress.  HENT:  Head: Normocephalic and atraumatic.  Mouth/Throat: Oropharynx is clear and moist.  Eyes: Pupils are equal, round, and reactive to light. Conjunctivae and EOM are normal. No scleral icterus.  No horizontal, vertical or rotational nystagmus  Neck: Normal range of motion. Neck supple.  Full active and  passive ROM without pain No midline or paraspinal tenderness No nuchal rigidity or meningeal signs  Cardiovascular: Normal rate, regular rhythm and intact distal pulses.   Pulmonary/Chest: Effort normal and breath sounds normal. No respiratory distress. She has no wheezes. She has no rales.  Abdominal: Soft. Bowel sounds are normal. There is no tenderness. There is no rebound and no guarding.  Musculoskeletal: Normal range of motion.  Lymphadenopathy:    She has no cervical adenopathy.  Neurological: She is alert and oriented to person, place, and time. No cranial nerve deficit. She exhibits normal muscle tone. Coordination normal.  Mental Status:  Alert, oriented, thought content appropriate. Speech fluent  without evidence of aphasia. Able to follow 2 step commands without difficulty.  Cranial Nerves:  II:  Peripheral visual fields grossly normal, pupils equal, round, reactive to light III,IV, VI: ptosis not present, extra-ocular motions intact bilaterally  V,VII: smile symmetric, facial light touch sensation equal VIII: hearing grossly normal bilaterally  IX,X: midline uvula rise  XI: bilateral shoulder shrug equal and strong XII: midline tongue extension  Motor:  5/5 in upper and lower extremities bilaterally including strong and equal grip strength and dorsiflexion/plantar flexion Sensory: Pinprick and light touch normal in all extremities.  Cerebellar: normal finger-to-nose with bilateral upper extremities Gait: normal gait and balance CV: distal pulses palpable throughout   Skin: Skin is warm and dry. No rash noted. She is not diaphoretic.  Psychiatric: She has a normal mood and affect. Her behavior is normal. Judgment and thought content normal.  Nursing note and vitals reviewed.    ED Treatments / Results  Labs (all labs ordered are listed, but only abnormal results are displayed) Labs Reviewed  BASIC METABOLIC PANEL  CBC  I-STAT TROPONIN, ED    EKG  EKG Interpretation None       Radiology Dg Chest 2 View  Result Date: 11/20/2016 CLINICAL DATA:  Chest pain and shortness of breath for 4 days EXAM: CHEST  2 VIEW COMPARISON:  March 04, 2015 FINDINGS: Lungs are clear. Heart size and pulmonary vascularity are normal. No adenopathy. No pneumothorax. No bone lesions. IMPRESSION: No edema or consolidation. Electronically Signed   By: Bretta Bang III M.D.   On: 11/20/2016 17:15    Procedures Procedures (including critical care time)  Medications Ordered in ED Medications - No data to display   Initial Impression / Assessment and Plan / ED Course  I have reviewed the triage vital signs and the nursing notes.  Pertinent labs & imaging results that were available  during my care of the patient were reviewed by me and considered in my medical decision making (see chart for details).     She did not wish to wait for headache treatment. She has no evidence of emergent cause of her headache.  heart score 1, PERC negative. She appears safe for discharge  discussed returned. Final Clinical Impressions(s) / ED Diagnoses   Final diagnoses:  Hypertension, unspecified type  Chest pain with low risk of acute coronary syndrome  Bad headache    New Prescriptions New Prescriptions   No medications on file     Arthor Captain, PA-C 11/23/16 2301    Abelino Derrick, MD 11/25/16 [phone]

## 2016-11-20 NOTE — ED Triage Notes (Signed)
Pt states central chest pain X2 days with associated shortness of breath and some dizziness. Pt alert and oriented, skin warm and dry. No distress noted. No cardiac hx.

## 2016-11-20 NOTE — Discharge Instructions (Signed)
Please take your blood pressure medication and follow up with a primary care doctor.  You are having a headache. No specific cause was found today for your headache. It may have been a migraine or other cause of headache. Stress, anxiety, fatigue, and depression are common triggers for headaches. Your headache today does not appear to be life-threatening or require hospitalization, but often the exact cause of headaches is not determined in the emergency department. Therefore, follow-up with your doctor is very important to find out what may have caused your headache, and whether or not you need any further diagnostic testing or treatment. Sometimes headaches can appear benign (not harmful), but then more serious symptoms can develop which should prompt an immediate re-evaluation by your doctor or the emergency department. SEEK MEDICAL ATTENTION IF: You develop possible problems with medications prescribed.  The medications don't resolve your headache, if it recurs , or if you have multiple episodes of vomiting or can't take fluids. You have a change from the usual headache. RETURN IMMEDIATELY IF you develop a sudden, severe headache or confusion, become poorly responsive or faint, develop a fever above 100.9F or problem breathing, have a change in speech, vision, swallowing, or understanding, or develop new weakness, numbness, tingling, incoordination, or have a seizure.  Your caregiver has diagnosed you as having chest pain that is not specific for one problem, but does not require admission.  You are at low risk for an acute heart condition or other serious illness. Chest pain comes from many different causes.  SEEK IMMEDIATE MEDICAL ATTENTION IF: You have severe chest pain, especially if the pain is crushing or pressure-like and spreads to the arms, back, neck, or jaw, or if you have sweating, nausea (feeling sick to your stomach), or shortness of breath. THIS IS AN EMERGENCY. Don't wait to see if the  pain will go away. Get medical help at once. Call 911 or 0 (operator). DO NOT drive yourself to the hospital.  Your chest pain gets worse and does not go away with rest.  You have an attack of chest pain lasting longer than usual, despite rest and treatment with the medications your caregiver has prescribed.  You wake from sleep with chest pain or shortness of breath.  You feel dizzy or faint.  You have chest pain not typical of your usual pain for which you originally saw your caregiver.

## 2016-11-28 ENCOUNTER — Emergency Department (HOSPITAL_COMMUNITY)
Admission: EM | Admit: 2016-11-28 | Discharge: 2016-11-28 | Disposition: A | Discharge status: Home / Self Care | Payer: Self-pay | Attending: Emergency Medicine | Admitting: Emergency Medicine

## 2016-11-28 ENCOUNTER — Encounter (HOSPITAL_COMMUNITY): Payer: Self-pay | Admitting: Emergency Medicine

## 2016-11-28 DIAGNOSIS — F17210 Nicotine dependence, cigarettes, uncomplicated: Secondary | ICD-10-CM | POA: Insufficient documentation

## 2016-11-28 DIAGNOSIS — I10 Essential (primary) hypertension: Secondary | ICD-10-CM | POA: Insufficient documentation

## 2016-11-28 DIAGNOSIS — R109 Unspecified abdominal pain: Secondary | ICD-10-CM | POA: Insufficient documentation

## 2016-11-28 LAB — URINALYSIS, ROUTINE W REFLEX MICROSCOPIC
Bilirubin Urine: NEGATIVE
Glucose, UA: NEGATIVE mg/dL
HGB URINE DIPSTICK: NEGATIVE
Ketones, ur: NEGATIVE mg/dL
Nitrite: NEGATIVE
PROTEIN: NEGATIVE mg/dL
SPECIFIC GRAVITY, URINE: 1.018 (ref 1.005–1.030)
pH: 8 (ref 5.0–8.0)

## 2016-11-28 LAB — PREGNANCY, URINE: PREG TEST UR: NEGATIVE

## 2016-11-28 MED ORDER — IBUPROFEN 800 MG PO TABS
800.0000 mg | ORAL_TABLET | Freq: Once | ORAL | Status: AC
  Administered 2016-11-28: 800 mg via ORAL
  Filled 2016-11-28: qty 1

## 2016-11-28 MED ORDER — OXYCODONE-ACETAMINOPHEN 5-325 MG PO TABS
1.0000 | ORAL_TABLET | ORAL | Status: DC | PRN
  Administered 2016-11-28: 1 via ORAL
  Filled 2016-11-28: qty 1

## 2016-11-28 MED ORDER — IBUPROFEN 800 MG PO TABS: 800.0000 mg | ORAL_TABLET | Freq: Three times a day (TID) | ORAL | 0 refills | Status: DC

## 2016-11-28 MED ORDER — CYCLOBENZAPRINE HCL 10 MG PO TABS: 10.0000 mg | ORAL_TABLET | Freq: Two times a day (BID) | ORAL | 0 refills | Status: DC | PRN

## 2016-11-28 NOTE — Discharge Instructions (Signed)
Your pain is likely due to a ruptured ovarian cyst.  Take ibuprofen and flexeril as needed for pain. Return if you develop fever, worsening abdominal pain, blood in urine, having vaginal bleeding or vaginal discharge.

## 2016-11-28 NOTE — ED Triage Notes (Signed)
Pt c/o left flank pain and nausea onset 1 hour ago. No aggravating or alleviating factors. No emesis, hematuria, dysuria, or urinary frequency.

## 2016-11-28 NOTE — ED Provider Notes (Signed)
[facility] Provider Note   CSN: [id number] Arrival date & time: 11/28/16  1016     History   Chief Complaint Chief Complaint  Patient presents with  . Flank Pain    HPI Bailey Carney is a 26 y.o. female.  HPI   26 year old femalePresenting complaining of left flank pain. Patient report earlier today she developed acute onset of atraumatic pain to her left flank. Described as a sharp sensation, nonradiating, worse with movement. Pain is moderate in severity. No associated fever, chills, abdominal pain, dysuria, hematuria, vaginal bleeding, vaginal discharge, or rash. She did received a Percocet while waiting the waiting room and it did help with the pain. She denies any strenuous activities or heavy lifting. No history of ovarian cyst. Last menstrual period was 7/28. No history of kidney stone. No change in sexual partner, no prior history of STI. Pain is currently 4/10.     Past Medical History:  Diagnosis Date  . Asthma   . Genital herpes   . Hypertension     Patient Active Problem List   Diagnosis Date Noted  . Palpitations 03/23/2015  . Dizziness 03/23/2015  . Asthma   . Hypertension   . Musculoskeletal chest pain 12/22/2013    Past Surgical History:  Procedure Laterality Date  . NO PAST SURGERIES      OB History    Gravida Para Term Preterm AB Living   0 0 0 0 0 0   SAB TAB Ectopic Multiple Live Births   0 0 0 0         Home Medications    Prior to Admission medications   Medication Sig Start Date End Date Taking? Authorizing Provider  hydrochlorothiazide (HYDRODIURIL) 12.5 MG tablet Take 1 tablet (12.5 mg total) by mouth daily. 11/20/16  Yes Harris, Abigail, PA-C  ibuprofen (ADVIL,MOTRIN) 600 MG tablet Take 1 tablet (600 mg total) by mouth every 6 (six) hours as needed. Patient not taking: Reported on 11/28/2016 03/27/16   Hurshel Party, CNM  promethazine-dextromethorphan (PROMETHAZINE-DM) 6.25-15 MG/5ML syrup Take 5 mLs by mouth 4  (four) times daily as needed for cough. Patient not taking: Reported on 11/28/2016 06/04/16   [name], NP  pseudoephedrine (SUDAFED) 60 MG tablet Take 1 tablet (60 mg total) by mouth every 6 (six) hours as needed for congestion. Patient not taking: Reported on 11/28/2016 06/04/16   [name], NP    Family History Family History  Problem Relation Age of Onset  . Hypertension Mother   . Breast cancer Maternal Grandmother   . Hypertension Maternal Grandmother   . Breast cancer Paternal Grandmother     Social History Social History  Substance Use Topics  . Smoking status: Never Smoker  . Smokeless tobacco: Never Used  . Alcohol use Yes     Comment: socially     Allergies   Amoxicillin   Review of Systems Review of Systems  All other systems reviewed and are negative.    Physical Exam Updated Vital Signs BP (!) 145/104 (BP Location: Left Arm)   Pulse 76   Temp 98.2 F (36.8 C) (Oral)   Resp 14   LMP 11/10/2016 (Within Days)   SpO2 100%   Physical Exam  Constitutional: She appears well-developed and well-nourished. No distress.  Obese female laying in bed in no acute discomfort, nontoxic  HENT:  Head: Atraumatic.  Eyes: Conjunctivae are normal.  Neck: Neck supple.  Cardiovascular: Normal rate and regular rhythm.   Pulmonary/Chest: Effort  normal and breath sounds normal.  Abdominal: Soft. She exhibits no distension. There is tenderness (Mild tenderness to left lower abdomen without guarding or rebound tenderness).  Genitourinary:  Genitourinary Comments: No CVA tenderness  Musculoskeletal: She exhibits tenderness (Tenderness to left paralumbar spinal muscle on palpation. No significant midline spine tenderness).  Neurological: She is alert.  Skin: No rash noted.  Psychiatric: She has a normal mood and affect.  Nursing note and vitals reviewed.    ED Treatments / Results  Labs (all labs ordered are listed, but only abnormal results are displayed) Labs  Reviewed  URINALYSIS, ROUTINE W REFLEX MICROSCOPIC - Abnormal; Notable for the following:       Result Value   APPearance CLOUDY (*)    Leukocytes, UA TRACE (*)    Bacteria, UA RARE (*)    Squamous Epithelial / LPF 6-30 (*)    All other components within normal limits  PREGNANCY, URINE    EKG  EKG Interpretation None       Radiology No results found.  Procedures Procedures (including critical care time)  Medications Ordered in ED Medications  oxyCODONE-acetaminophen (PERCOCET/ROXICET) 5-325 MG per tablet 1 tablet (1 tablet Oral Given 11/28/16 1033)     Initial Impression / Assessment and Plan / ED Course  I have reviewed the triage vital signs and the nursing notes.  Pertinent labs & imaging results that were available during my care of the patient were reviewed by me and considered in my medical decision making (see chart for details).     BP (!) 145/104 (BP Location: Left Arm)   Pulse 76   Temp 98.2 F (36.8 C) (Oral)   Resp 14   LMP 11/10/2016 (Within Days)   SpO2 100%    Final Clinical Impressions(s) / ED Diagnoses   Final diagnoses:  Left flank pain    New Prescriptions New Prescriptions   CYCLOBENZAPRINE (FLEXERIL) 10 MG TABLET    Take 1 tablet (10 mg total) by mouth 2 (two) times daily as needed for muscle spasms.   IBUPROFEN (ADVIL,MOTRIN) 800 MG TABLET    Take 1 tablet (800 mg total) by mouth 3 (three) times daily.   12:40 PM Patient here with pain to her left flank reproducible on exam without any CVA tenderness. Urine shows no evidence of a TIA or blood to suggest kidney stone. Her pregnancy test is negative. Her abdomen is otherwise fairly benign. No signs of skin rash. Patient denies any new sexual partner having any GU symptoms. Pelvic exam offered, patient declined. At this time, I felt that the pain is even muscle skeletal, or perhaps ruptured ovarian cyst. Have low suspicion for ovarian torsion, nephrolithiasis, aortic dissection, or any other  acute emergent medical condition. The pain seems to be pretty well-controlled. She is well-appearing. Recommend anti inflammatory medication and muscle relaxant to use as needed. Patient was sent to return if her condition worsen.   Fayrene Helperran, Anuhea Gassner, PA-C 11/28/16 1304    Cardama, Amadeo GarnetPedro Eduardo, MD 11/29/16 [phone]

## 2017-03-15 ENCOUNTER — Ambulatory Visit (HOSPITAL_COMMUNITY)
Admission: EM | Admit: 2017-03-15 | Discharge: 2017-03-15 | Disposition: A | Discharge status: Home / Self Care | Payer: Self-pay | Attending: Family Medicine | Admitting: Family Medicine

## 2017-03-15 ENCOUNTER — Encounter (HOSPITAL_COMMUNITY): Payer: Self-pay | Admitting: Family Medicine

## 2017-03-15 DIAGNOSIS — J069 Acute upper respiratory infection, unspecified: Secondary | ICD-10-CM

## 2017-03-15 DIAGNOSIS — J4530 Mild persistent asthma, uncomplicated: Secondary | ICD-10-CM

## 2017-03-15 MED ORDER — PREDNISONE 50 MG PO TABS: ORAL_TABLET | ORAL | 0 refills | Status: DC

## 2017-03-15 MED ORDER — ALBUTEROL SULFATE HFA 108 (90 BASE) MCG/ACT IN AERS: 2.0000 | INHALATION_SPRAY | RESPIRATORY_TRACT | 0 refills | Status: DC | PRN

## 2017-03-15 NOTE — ED Triage Notes (Signed)
Pt here for cough. She has a hx of asthma.

## 2017-03-15 NOTE — ED Provider Notes (Signed)
[facility]    CSN: [id number] Arrival date & time: 03/15/17  1002     History   Chief Complaint Chief Complaint  Patient presents with  . Cough    HPI Bailey Carney is a 26 y.o. female.   26 year old female with a history of asthma presents to the urgent care with a history of frequent cough occasionally mild shortness of breath and posttussive emesis. Symptoms started about one week ago. The emesis today. Denies any known fever. She does feel hot and cold but no Reiter's. No known fevers. She does not have an available albuterol inhaler at home nor does she have a PCP.      Past Medical History:  Diagnosis Date  . Asthma   . Genital herpes   . Hypertension     Patient Active Problem List   Diagnosis Date Noted  . Palpitations 03/23/2015  . Dizziness 03/23/2015  . Asthma   . Hypertension   . Musculoskeletal chest pain 12/22/2013    Past Surgical History:  Procedure Laterality Date  . NO PAST SURGERIES      OB History    Gravida Para Term Preterm AB Living   0 0 0 0 0 0   SAB TAB Ectopic Multiple Live Births   0 0 0 0         Home Medications    Prior to Admission medications   Medication Sig Start Date End Date Taking? Authorizing Provider  albuterol (PROVENTIL HFA;VENTOLIN HFA) 108 (90 Base) MCG/ACT inhaler Inhale 2 puffs into the lungs every 4 (four) hours as needed for wheezing or shortness of breath. 03/15/17   Hayden RasmussenMabe, Jamarious Febo, NP  cyclobenzaprine (FLEXERIL) 10 MG tablet Take 1 tablet (10 mg total) by mouth 2 (two) times daily as needed for muscle spasms. 11/28/16   Fayrene Helperran, Bowie, PA-C  hydrochlorothiazide (HYDRODIURIL) 12.5 MG tablet Take 1 tablet (12.5 mg total) by mouth daily. 11/20/16   Arthor CaptainHarris, Abigail, PA-C  ibuprofen (ADVIL,MOTRIN) 800 MG tablet Take 1 tablet (800 mg total) by mouth 3 (three) times daily. 11/28/16   Fayrene Helperran, Bowie, PA-C  predniSONE (DELTASONE) 50 MG tablet 1 tab po daily for 6 days. Take with food. 03/15/17   Hayden RasmussenMabe, Ranita Stjulien, NP      Family History Family History  Problem Relation Age of Onset  . Hypertension Mother   . Breast cancer Maternal Grandmother   . Hypertension Maternal Grandmother   . Breast cancer Paternal Grandmother     Social History Social History   Tobacco Use  . Smoking status: Never Smoker  . Smokeless tobacco: Never Used  Substance Use Topics  . Alcohol use: Yes    Comment: socially  . Drug use: No     Allergies   Amoxicillin   Review of Systems Review of Systems  Constitutional: Negative.   HENT: Positive for postnasal drip and rhinorrhea.   Respiratory: Positive for cough and shortness of breath.   Gastrointestinal: Negative.   Neurological: Negative.   All other systems reviewed and are negative.    Physical Exam Triage Vital Signs ED Triage Vitals  Enc Vitals Group     BP 03/15/17 1017 (!) 135/97     Pulse Rate 03/15/17 1015 74     Resp 03/15/17 1015 18     Temp 03/15/17 1015 98.2 F (36.8 C)     Temp Source 03/15/17 1015 Oral     SpO2 03/15/17 1015 99 %     Weight --  Height --      Head Circumference --      Peak Flow --      Pain Score --      Pain Loc --      Pain Edu? --      Excl. in GC? --    No data found.  Updated Vital Signs BP (!) 135/97   Pulse 74   Temp 98.2 F (36.8 C) (Oral)   Resp 18   LMP 03/05/2017   SpO2 99%   Visual Acuity Right Eye Distance:   Left Eye Distance:   Bilateral Distance:    Right Eye Near:   Left Eye Near:    Bilateral Near:     Physical Exam  Constitutional: She is oriented to person, place, and time. She appears well-developed and well-nourished. No distress.  HENT:  Oropharynx with minimal erythema, cobblestoning and clear thick PND.  Eyes: EOM are normal.  Neck: Normal range of motion. Neck supple.  Cardiovascular: Normal rate, regular rhythm and normal heart sounds.  Pulmonary/Chest: Effort normal. No respiratory distress.  With tidal volume lungs sounds are clear. Mentation is forced to  exhale forcefully or cough there is a faint distant wheeze bilaterally. Air movement is good. Good chest movement.  Musculoskeletal: She exhibits no edema.  Lymphadenopathy:    She has no cervical adenopathy.  Neurological: She is alert and oriented to person, place, and time. She exhibits normal muscle tone.  Skin: Skin is warm and dry.  Psychiatric: She has a normal mood and affect.  Nursing note and vitals reviewed.    UC Treatments / Results  Labs (all labs ordered are listed, but only abnormal results are displayed) Labs Reviewed - No data to display  EKG  EKG Interpretation None       Radiology No results found.  Procedures Procedures (including critical care time)  Medications Ordered in UC Medications - No data to display   Initial Impression / Assessment and Plan / UC Course  I have reviewed the triage vital signs and the nursing notes.  Pertinent labs & imaging results that were available during my care of the patient were reviewed by me and considered in my medical decision making (see chart for details).    Albuterol inhaler as directed every 4 hours for cough or shortness of breath. Drink plenty of fluids and water daily. Take the prednisone one time daily to help with inflammation of the airways and take Allegra 180 mg a day for drainage in the back of your throat.    Final Clinical Impressions(s) / UC Diagnoses   Final diagnoses:  Mild persistent asthma without complication  Viral upper respiratory tract infection    ED Discharge Orders        Ordered    albuterol (PROVENTIL HFA;VENTOLIN HFA) 108 (90 Base) MCG/ACT inhaler  Every 4 hours PRN     03/15/17 1028    predniSONE (DELTASONE) 50 MG tablet     03/15/17 1028       Controlled Substance Prescriptions [place] Controlled Substance Registry consulted? Not Applicable   Hayden RasmussenMabe, Margorie Renner, NP 03/15/17 1030

## 2017-03-15 NOTE — Discharge Instructions (Signed)
Albuterol inhaler as directed every 4 hours for cough or shortness of breath. Drink plenty of fluids and water daily. Take the prednisone one time daily to help with inflammation of the airways and take Allegra 180 mg a day for drainage in the back of your throat.

## 2017-06-03 ENCOUNTER — Emergency Department (HOSPITAL_COMMUNITY)
Admission: EM | Admit: 2017-06-03 | Discharge: 2017-06-03 | Disposition: A | Discharge status: Home / Self Care | Payer: Self-pay | Attending: Emergency Medicine | Admitting: Emergency Medicine

## 2017-06-03 ENCOUNTER — Other Ambulatory Visit: Payer: Self-pay

## 2017-06-03 ENCOUNTER — Encounter (HOSPITAL_COMMUNITY): Payer: Self-pay

## 2017-06-03 DIAGNOSIS — J45909 Unspecified asthma, uncomplicated: Secondary | ICD-10-CM | POA: Insufficient documentation

## 2017-06-03 DIAGNOSIS — Z79899 Other long term (current) drug therapy: Secondary | ICD-10-CM | POA: Insufficient documentation

## 2017-06-03 DIAGNOSIS — Y999 Unspecified external cause status: Secondary | ICD-10-CM | POA: Insufficient documentation

## 2017-06-03 DIAGNOSIS — Y929 Unspecified place or not applicable: Secondary | ICD-10-CM | POA: Insufficient documentation

## 2017-06-03 DIAGNOSIS — I10 Essential (primary) hypertension: Secondary | ICD-10-CM | POA: Insufficient documentation

## 2017-06-03 DIAGNOSIS — W260XXA Contact with knife, initial encounter: Secondary | ICD-10-CM | POA: Insufficient documentation

## 2017-06-03 DIAGNOSIS — Z9114 Patient's other noncompliance with medication regimen: Secondary | ICD-10-CM | POA: Insufficient documentation

## 2017-06-03 DIAGNOSIS — S61211A Laceration without foreign body of left index finger without damage to nail, initial encounter: Secondary | ICD-10-CM | POA: Insufficient documentation

## 2017-06-03 DIAGNOSIS — Y9389 Activity, other specified: Secondary | ICD-10-CM | POA: Insufficient documentation

## 2017-06-03 MED ORDER — NAPROXEN 500 MG PO TABS: 500.0000 mg | ORAL_TABLET | Freq: Two times a day (BID) | ORAL | 0 refills | Status: DC

## 2017-06-03 MED ORDER — LIDOCAINE HCL (PF) 1 % IJ SOLN
5.0000 mL | Freq: Once | INTRAMUSCULAR | Status: AC
  Administered 2017-06-03: 5 mL
  Filled 2017-06-03: qty 5

## 2017-06-03 MED ORDER — HYDROCHLOROTHIAZIDE 12.5 MG PO TABS: 12.5000 mg | ORAL_TABLET | Freq: Every day | ORAL | 0 refills | Status: DC

## 2017-06-03 NOTE — ED Provider Notes (Signed)
[facility] EMERGENCY DEPARTMENT Provider Note   CSN: [id number] Arrival date & time: 06/03/17  1800     History   Chief Complaint Chief Complaint  Patient presents with  . Laceration    HPI Bailey Carney is a 27 y.o. female with a hx of asthma and HTN who presents to the ED for laceration to the left 2nd digit that occurred at 17:30. Patient states she was using a pocket knife to cut open her phone case. States that she had the knife upside down and it slipped cutting her finger. States the finger is in pain, rates the pain a 10/10 in severity, worse with movement of finger, alleviated by nothing. Has not washed out the wound. States last tetanus was in 2017. Denies numbness or weakness. Patient states she has been out of her blood pressure medication for several months. Patient is R hand dominant.  HPI  Past Medical History:  Diagnosis Date  . Asthma   . Genital herpes   . Hypertension     Patient Active Problem List   Diagnosis Date Noted  . Palpitations 03/23/2015  . Dizziness 03/23/2015  . Asthma   . Hypertension   . Musculoskeletal chest pain 12/22/2013    Past Surgical History:  Procedure Laterality Date  . NO PAST SURGERIES      OB History    Gravida Para Term Preterm AB Living   0 0 0 0 0 0   SAB TAB Ectopic Multiple Live Births   0 0 0 0         Home Medications    Prior to Admission medications   Medication Sig Start Date End Date Taking? Authorizing Provider  albuterol (PROVENTIL HFA;VENTOLIN HFA) 108 (90 Base) MCG/ACT inhaler Inhale 2 puffs into the lungs every 4 (four) hours as needed for wheezing or shortness of breath. 03/15/17   Hayden Rasmussen, NP  cyclobenzaprine (FLEXERIL) 10 MG tablet Take 1 tablet (10 mg total) by mouth 2 (two) times daily as needed for muscle spasms. 11/28/16   Fayrene Helper, PA-C  hydrochlorothiazide (HYDRODIURIL) 12.5 MG tablet Take 1 tablet (12.5 mg total) by mouth daily. 11/20/16   Arthor Captain, PA-C    ibuprofen (ADVIL,MOTRIN) 800 MG tablet Take 1 tablet (800 mg total) by mouth 3 (three) times daily. 11/28/16   Fayrene Helper, PA-C  predniSONE (DELTASONE) 50 MG tablet 1 tab po daily for 6 days. Take with food. 03/15/17   Hayden Rasmussen, NP    Family History Family History  Problem Relation Age of Onset  . Hypertension Mother   . Breast cancer Maternal Grandmother   . Hypertension Maternal Grandmother   . Breast cancer Paternal Grandmother     Social History Social History   Tobacco Use  . Smoking status: Never Smoker  . Smokeless tobacco: Never Used  Substance Use Topics  . Alcohol use: Yes    Comment: socially  . Drug use: No     Allergies   Amoxicillin   Review of Systems Review of Systems  Constitutional: Negative for chills and fever.  Respiratory: Negative for shortness of breath.   Cardiovascular: Negative for chest pain.  Musculoskeletal:       Positive for pain to the left 2nd digit.   Skin: Positive for wound (left 2nd digit).  Neurological: Negative for weakness, numbness and headaches.    Physical Exam Updated Vital Signs BP (!) 149/104   Pulse 67   Temp 98.3 F (36.8 C) (Oral)  Resp 16   Ht 5\' 7"  (1.702 m)   Wt 97.5 kg (215 lb)   SpO2 100%   BMI 33.67 kg/m   Physical Exam  Constitutional: She appears well-developed and well-nourished. No distress.  HENT:  Head: Normocephalic and atraumatic.  Eyes: Conjunctivae are normal. Right eye exhibits no discharge. Left eye exhibits no discharge.  Cardiovascular:  Pulses:      Radial pulses are 2+ on the right side, and 2+ on the left side.  Musculoskeletal:  Upper extremities: Patient has full range of motion to  bilateral wrists and all MCPs, PIPs, and DIPs.  Patient is mildly tender to palpation directly over laceration, otherwise nontender.  Neurological: She is alert.  Clear speech.  Sensation grossly intact bilateral upper extremities.  Patient has 5 out of 5 grip strength bilaterally.   Median/radial/ulnar nerve function intact by OK sign, thumbs up, and crossing of second and third digits.  Skin: Capillary refill takes less than 2 seconds. Laceration (Approximately 1 cm size laceration to the dorsal radial aspect of the left second digit DIP joint.  There is no active bleeding or evident foreign body.) noted.  Psychiatric: She has a normal mood and affect. Her behavior is normal. Thought content normal.  Nursing note and vitals reviewed.   ED Treatments / Results  Labs (all labs ordered are listed, but only abnormal results are displayed) Labs Reviewed - No data to display  EKG  EKG Interpretation None      Radiology No results found.  Procedures .Marland KitchenLaceration Repair Date/Time: 06/03/2017 9:15 PM Performed by: Cherly Anderson, PA-C Authorized by: Cherly Anderson, PA-C   Consent:    Consent obtained:  Verbal   Consent given by:  Patient   Risks discussed:  Infection, poor cosmetic result and pain   Alternatives discussed:  No treatment Anesthesia (see MAR for exact dosages):    Anesthesia method:  Nerve block   Block needle gauge:  27 G   Block anesthetic:  Lidocaine 1% w/o epi   Block injection procedure:  Anatomic landmarks identified Laceration details:    Location:  Finger   Finger location:  L index finger   Length (cm):  1 Repair type:    Repair type:  Simple Pre-procedure details:    Preparation:  Patient was prepped and draped in usual sterile fashion Exploration:    Hemostasis achieved with:  Direct pressure   Wound exploration: wound explored through full range of motion and entire depth of wound probed and visualized     Contaminated: no   Treatment:    Area cleansed with:  Betadine   Amount of cleaning:  Standard   Irrigation solution:  Sterile water   Irrigation volume:  1L   Irrigation method:  Syringe Skin repair:    Repair method:  Sutures   Suture size:  4-0   Suture material:  Nylon   Suture technique:  Simple  interrupted   Number of sutures:  3 Approximation:    Approximation:  Close   Vermilion border: well-aligned   Post-procedure details:    Dressing:  Antibiotic ointment, non-adherent dressing and splint for protection   Patient tolerance of procedure:  Tolerated well, no immediate complications   (including critical care time)  Medications Ordered in ED Medications  lidocaine (PF) (XYLOCAINE) 1 % injection 5 mL (5 mLs Infiltration Given by Other 06/03/17 2000)   Initial Impression / Assessment and Plan / ED Course  I have reviewed the triage vital signs and  the nursing notes.  Pertinent labs & imaging results that were available during my care of the patient were reviewed by me and considered in my medical decision making (see chart for details).   Patient presents with laceration to the 2nd left digit. Patient is nontoxic appearing, in no apparent distress, vitals are WNL other than elevated BP in setting of patient not having her anti-hypertensive medications for several months. No indication of HTN emergency. Discussed this with patient who is requesting refill of her hydrochlorathiazide- will provide 30 day supply, discussed need for PCP follow up and recheck. Laceration occurred < 8 hours PTA. Irrigation with 1 L sterile water performed. Wound explored and base of wound visualized in a bloodless field without evidence of foreign body.  Repair per procedure note above. Tetanus is up to date. Patient is without comorbidities to effect normal wound healing. Patient to be discharged home without antibiotics. Given laceration at DIP, will place patient in a finger splint.  Discussed suture home care with patient and answered questions. Patient to follow-up for wound check and suture removal in 7 days; they are to return to the ED sooner for signs of infection. Will provide prescription for Naproxen for pain and will refill patient's BP medication. I discussed results, treatment plan, need for PCP  follow-up, need for wound recheck/suture removal, and return precautions with the patient. Provided opportunity for questions, patient confirmed understanding and is in agreement with plan.   Final Clinical Impressions(s) / ED Diagnoses   Final diagnoses:  Laceration of left index finger without foreign body without damage to nail, initial encounter    ED Discharge Orders        Ordered    hydrochlorothiazide (HYDRODIURIL) 12.5 MG tablet  Daily     06/03/17 2119    naproxen (NAPROSYN) 500 MG tablet  2 times daily     06/03/17 2119       Cherly Andersonetrucelli, Delores Edelstein R, PA-C 06/04/17 0155    Loren RacerYelverton, David, MD 06/06/17 1031

## 2017-06-03 NOTE — Discharge Instructions (Signed)
Today you were seen for a laceration to your left second finger.  3 stitches were placed in the laceration.  Do not get the wound wet for the first 24 hours, after this you may not soak the wound.  Keep the splint on to maintain the finger in extension as to prevent reopening the wound.  These stitches will need to be removed with a wound recheck in 7 days.  You may come to the emergency department, go to an urgent care, or see primary care provider.  Given you do not have a primary care provider multiple options were provided in your discharge instructions, additionally please see the phone number attached to your discharge instructions, you may call this in order to set up an appointment with a primary care provider.  You were given a prescription for naproxen for pain: Naproxen is a nonsteroidal anti-inflammatory medication that will help with pain and swelling. Be sure to take this medication as prescribed with food, 1 pill every 12 hours,  It should be taken with food, as it can cause stomach upset, and more seriously, stomach bleeding. Do not take other nonsteroidal anti-inflammatory medications with this such as Advil, Motrin, or Aleve.   Return to the emergency department sooner if you start to experience fever, chills, redness around the laceration, or discharge from the wound as these are signs that it may be infected.  Return to the emergency department for any other concerns he may have.  Additionally your blood pressure was elevated in the emergency department today, I suspect that this was related to not having your blood pressure medication.  We have refilled this medication for you for a 30-day supply.  It is essential that you are rechecked by primary care provider for a recheck of your blood pressure and further management of this.  Vitals:   06/03/17 1810 06/03/17 1815  BP: (!) 180/150 (!) 149/104  Pulse: 67   Resp: 16   Temp: 98.3 F (36.8 C)   SpO2: 100%

## 2017-06-03 NOTE — ED Triage Notes (Signed)
Pt presents to the ed with complaints of a laceration to her right first finger from a pocket knife.  Bleeding controlled

## 2017-06-03 NOTE — ED Notes (Signed)
Patient verbalized understanding of discharge instructions and denies any further needs or questions at this time. VS stable. Patient ambulatory with steady gait.  

## 2017-06-28 ENCOUNTER — Other Ambulatory Visit: Payer: Self-pay

## 2017-06-28 ENCOUNTER — Inpatient Hospital Stay (HOSPITAL_COMMUNITY)
Admission: AD | Admit: 2017-06-28 | Discharge: 2017-06-28 | Disposition: A | Discharge status: Home / Self Care | Payer: Self-pay | Source: Ambulatory Visit | Attending: Obstetrics & Gynecology | Admitting: Obstetrics & Gynecology

## 2017-06-28 ENCOUNTER — Encounter (HOSPITAL_COMMUNITY): Payer: Self-pay | Admitting: *Deleted

## 2017-06-28 DIAGNOSIS — R1084 Generalized abdominal pain: Secondary | ICD-10-CM | POA: Insufficient documentation

## 2017-06-28 DIAGNOSIS — I10 Essential (primary) hypertension: Secondary | ICD-10-CM | POA: Insufficient documentation

## 2017-06-28 DIAGNOSIS — Z3202 Encounter for pregnancy test, result negative: Secondary | ICD-10-CM

## 2017-06-28 DIAGNOSIS — N760 Acute vaginitis: Secondary | ICD-10-CM | POA: Insufficient documentation

## 2017-06-28 DIAGNOSIS — B9689 Other specified bacterial agents as the cause of diseases classified elsewhere: Secondary | ICD-10-CM | POA: Insufficient documentation

## 2017-06-28 DIAGNOSIS — Z79899 Other long term (current) drug therapy: Secondary | ICD-10-CM | POA: Insufficient documentation

## 2017-06-28 DIAGNOSIS — J45909 Unspecified asthma, uncomplicated: Secondary | ICD-10-CM | POA: Insufficient documentation

## 2017-06-28 LAB — URINALYSIS, ROUTINE W REFLEX MICROSCOPIC
BILIRUBIN URINE: NEGATIVE
Glucose, UA: NEGATIVE mg/dL
Hgb urine dipstick: NEGATIVE
Ketones, ur: 5 mg/dL — AB
LEUKOCYTES UA: NEGATIVE
NITRITE: NEGATIVE
Protein, ur: NEGATIVE mg/dL
SPECIFIC GRAVITY, URINE: 1.029 (ref 1.005–1.030)
pH: 6 (ref 5.0–8.0)

## 2017-06-28 LAB — COMPREHENSIVE METABOLIC PANEL
ALBUMIN: 4.2 g/dL (ref 3.5–5.0)
ALT: 15 U/L (ref 14–54)
AST: 18 U/L (ref 15–41)
Alkaline Phosphatase: 44 U/L (ref 38–126)
Anion gap: 9 (ref 5–15)
BUN: 21 mg/dL — ABNORMAL HIGH (ref 6–20)
CHLORIDE: 99 mmol/L — AB (ref 101–111)
CO2: 26 mmol/L (ref 22–32)
Calcium: 9 mg/dL (ref 8.9–10.3)
Creatinine, Ser: 0.67 mg/dL (ref 0.44–1.00)
GFR calc Af Amer: >60 mL/min (ref >60)
GFR calc non Af Amer: >60 mL/min (ref >60)
GLUCOSE: 88 mg/dL (ref 65–99)
POTASSIUM: 3.6 mmol/L (ref 3.5–5.1)
Sodium: 134 mmol/L — ABNORMAL LOW (ref 135–145)
Total Bilirubin: 0.7 mg/dL (ref 0.3–1.2)
Total Protein: 7.7 g/dL (ref 6.5–8.1)

## 2017-06-28 LAB — CBC
HEMATOCRIT: 40.3 % (ref 36.0–46.0)
Hemoglobin: 13.8 g/dL (ref 12.0–15.0)
MCH: 29.9 pg (ref 26.0–34.0)
MCHC: 34.2 g/dL (ref 30.0–36.0)
MCV: 87.2 fL (ref 78.0–100.0)
PLATELETS: 208 10*3/uL (ref 150–400)
RBC: 4.62 MIL/uL (ref 3.87–5.11)
RDW: 12.6 % (ref 11.5–15.5)
WBC: 11.4 10*3/uL — AB (ref 4.0–10.5)

## 2017-06-28 LAB — WET PREP, GENITAL
SPERM: NONE SEEN
Trich, Wet Prep: NONE SEEN
Yeast Wet Prep HPF POC: NONE SEEN

## 2017-06-28 LAB — HCG, SERUM, QUALITATIVE: Preg, Serum: NEGATIVE

## 2017-06-28 LAB — POCT PREGNANCY, URINE: PREG TEST UR: NEGATIVE

## 2017-06-28 MED ORDER — METOCLOPRAMIDE HCL 10 MG PO TABS
10.0000 mg | ORAL_TABLET | Freq: Once | ORAL | Status: AC
  Administered 2017-06-28: 10 mg via ORAL
  Filled 2017-06-28: qty 1

## 2017-06-28 MED ORDER — METOCLOPRAMIDE HCL 10 MG PO TABS: 10.0000 mg | ORAL_TABLET | Freq: Four times a day (QID) | ORAL | 0 refills | Status: DC

## 2017-06-28 MED ORDER — METRONIDAZOLE 500 MG PO TABS: 500.0000 mg | ORAL_TABLET | Freq: Two times a day (BID) | ORAL | 0 refills | Status: DC

## 2017-06-28 NOTE — Discharge Instructions (Signed)
[place] (Revised August 2014)    Insufficient Money for Medicine:           [address]: call "211"    MAP Program at [place] or [place] [phone]            No Primary Care Doctor:  To locate a primary care doctor that accepts your insurance or provides certain services:           [place]: [phone]           Physician Referral Service: [phone] ask for My [place]  If no insurance, you need to see if you qualify for [facility] orange card, call to set      up appointment for eligibility/enrollment at [phone] or [phone] or visit [place] ([address], [place] and [place]) to meet with a [facility] enrollment specialist.  Agencies that provide inexpensive (sliding fee scale) medical care:       [facility] - Family Medicine at [place] - [phone]     [facility] [place] - [place] [facility] - [place] [phone]     [facility] for Children - [place] [phone]  [facility] - [place] @ [place] [phone]  [facility] - [place] @ [place] - [phone]  [facility]: [phone]   Women's Clinic: [phone]   Planned Parenthood: [phone]   [facility] of the [place]    [place] Providers:           [facility] [phone] (No Family Planning accepted)          [address], [phone], Mon-Fri 9am-5pm          [facility] - [phone]  [place], [address] [place], Mon-Thursday 8am-5pm, Fri 8am-noon  [employer] -  [place], [address], Mon-Fri 7:30am-4:30pm          [place] - [phone]          [address], [facility] - [phone] [address], [address]          Only accepts [place] [employer] patients after they have their name applied to their card  Self Pay (no insurance) in [facility]:           Sickle Cell Patients:   [place], [phone] [facility]:  [address] [place] [phone]       [facility]  [address] [place] [phone]  [place]:  [address] [phone]          [facility]           [place], [phone] [facility] for [place], [phone]           [facility] [place]           [place], [address], [place] [address]           [phone], Mon-Fri 9am-7pm, [place] 9am-1pm          [facility] - Family Medicine @ [place], [place]          [facility] - [facility]           [address] [place] [facility] Adult and [place]  [address] [place] [phone]          [place], [place]  [facility] - [place]   [place], [place] [phone] [facility] and [place]  [address] [phone]  Dr. Vista Lawman           [address], [place], [facility]           [address] [phone]          [facility]             [address]  [phone]          [facility]           [place], [place], 1st & 3rd Saturday every month, 10am-1pm OTHERS:  [facility]  ([facility] Only)  [phone] (Thursday only) Strategies for finding a Primary Care Provider:  1) Find a Doctor and Pay Out of Pocket  Although you won't have to find out who is covered by your insurance plan, it is a good idea to ask around and get recommendations. You will then need to call the office and see if the doctor you have chosen will accept you as a new patient and what types of options they offer for patients who are self-pay. Some doctors offer discounts or will set up payment plans for their patients who do not have insurance, but you will need to ask so you aren't surprised when you get to your appointment.  2) [place] - To see if you qualify for orange card access to healthcare safety net providers.  Call for appointment for eligibility/enrollment at [phone] or [phone]. (Uninsured, 0-200% FPL, qualifying info)  Applicants for [facility] are first required to see if they are eligible to enroll in the [facility] Marketplace before enrolling in [facility] (and get an exemption if they are not).  GCCN Criteria for acceptance is:  ? Proof of ACA Marketing exemption - form or documentation  ? Valid photo ID (driver's license, state identification card, passport, home country ID)  ? Proof of [facility] residency (e.g. drivers license, lease/landlord information, pay stubs with address, utility bill, bank statement, etc.)  ? Proof of income (1040, last year's tax return, W2, 4 current pay stubs, other income proof)  ? Proof of assets (current bank statement +  3 most recent, disability paperwork, life insurance info, tax value on autos, etc.)  3) [place] Department  Not all health departments have doctors that can see patients for sick visits, but many do, so it is worth a call  to see if yours does. If you don't know where your local health department is, you can check in your phone book. The CDC also has a tool to help you locate your state's health department, and many state websites also have listings of all of their local health departments.  4) Find a [place] [facility]  If your illness is not likely to be very severe or complicated, you may want to try a walk in clinic. These are popping up all over the country in pharmacies, drugstores, and shopping centers. They're usually staffed by nurse practitioners or physician assistants that have been trained to treat common illnesses and complaints. They're usually fairly quick and inexpensive. However, if you have serious medical issues or chronic medical problems, these are probably not your best option   STD Testing:           [place], [place] [facility]           [address] [place], phone [phone] or [phone]           Monday - Friday, call for an appointment          [place], [place] [facility]           [address], [place], phone [phone] or [phone]           Monday - Friday, call for an appointment Abuse/Neglect:           [place]: [place]: [phone] (After Hours) Emergency Shelter:  [facility] [phone]  Ages- [phone]  [place] - [phone]  Youth Focus - Act Together - [phone] (ages 32-17)  [place] @ [employer] - [phone]   Mammograms - Free at [facility]:           Room at the [place]: [phone]   (Homeless mother with children)          [place]: [phone] (Mothers only)  Youth Focus: [phone] (Pregnant 3-21 years old)  Adopt a Mom  [phone]  [place] [facility]   [facility] [place]  [address] [place] [phone]          [facility] [place]           [address], [place], [place]          [facility] Dept.           [place], [place]  [place] [facility]           [phone]          [facility] in [place]           [phone]           [phone] (After Hours)  [place] Abuse Resources:           Alcohol and Drug Services: [phone]           [facility]: [phone]          [facility]: [phone]   Narcotics Helpline - [phone]          [facility]: [phone]           Residential & Outpatient Substance Abuse Program - [place]: [phone]  [employer]  [place] and [place] - [phone] Psychological Services:          [place]: [phone]   Therapeutic Alternatives: [phone]          [place]           [address]: [phone]     (24 Hour)  Mobile Crisis:   [place]:  Radio producer on [place] [phone] [facility] on [place] [phone]  Walk In [place]  ([place] - [phone] or [phone]  [place]. [place] [phone]  [place] [address] [place] [phone]   Dental Assistance:  If unable to pay or uninsured, contact: [facility] to become qualified for the adult dental clinic. Patient must be enrolled in [facility] (uninsured, 0-200% FPL, qualifying info).  Enroll in [facility] first, then see Primary Care Physician assigned to you, the PCP makes a dental referral. [place] [facility] will receive referral and contacts patient for appointment.  Patients with Medicaid           [address], [place] (Children up to 80 + Pregnant Women) - [phone]  [place] - Suite [phone]  If unable to pay, or uninsured: contact [place] [phone] in [place] - ([place] only + Pregnant Women), [phone] in [place] only) to become qualified for the adult dental clinic  Must see if eligible to enroll in [place] before enrolling into the [facility] (exemption required) [phone] for an appointment)  [email or web address];   [phone].  If not eligible for ACA, then go by Department of Health and Human Services to see if eligible for orange card.  [address], [place].  Once you get an orange card, you will have a Primary Care home who will then refer you to dental if needed.  Other IT consultant:   [facility] - [phone] (ext [phone])   [address]  Dr.  [place] - [phone]   [address]    [place] - [phone]   [address] [facility]           [address] [place], [place], [place], [address]           [phone], Ext. [phone]           2nd and 4th Thursday of the month at 6:30am (Simple extractions only - no wisdom teeth or surgery) First come/First serve -First 10 clients served           [facility] [place], [place] and [place] residents only)          [address] [place], [place], [place], [address]           [phone]                    [facility] Health Department           [phone]          [facility] Health Department          [phone]         [facility] - [facility]          [phone]   Transportation Options:  Ambulance - 911 - [phone] per ride Family Member to accompany patient (if stable) [place] [employer] - [phone]  PART - [phone]  Taxi - [phone] - [place]  SCAT - [phone] (Application required)  [place] - [phone]         Abdominal Pain, Adult Abdominal pain can be caused by many things. Often, abdominal pain is not serious and it gets better with no treatment or by being treated at home. However, sometimes abdominal pain is serious. Your health care provider will do a medical history and a physical exam to try to determine the cause of your abdominal pain. Follow these instructions at home:  Take over-the-counter and prescription medicines only as told by your health care provider. Do not take a laxative unless told by your health care provider.  Drink enough fluid to keep your urine clear or pale yellow.  Watch your condition for any changes.  Keep all follow-up visits as told by your health care provider. This is important. Contact a health care provider if:  Your abdominal pain changes or gets worse.  You are not hungry or you lose weight without trying.  You are constipated or have diarrhea for more than 2-3 days.  You have pain when you urinate or have a bowel movement.  Your abdominal pain wakes you up at night.  Your pain gets worse with meals, after eating, or with certain foods.  You are throwing up and cannot keep anything down.  You have a fever. Get help right away if:  Your pain does not go away as soon as your health care provider  told you to expect.  You cannot stop throwing up.  Your pain is only in areas of the abdomen, such as the right side or the left lower portion of the abdomen.  You have bloody or black stools, or stools that look like tar.  You have severe pain, cramping, or bloating in your abdomen.  You have signs of dehydration, such as: ? Dark urine, very little urine, or no urine. ? Cracked lips. ? Dry mouth. ? Sunken eyes. ? Sleepiness. ? Weakness. This information is not intended to replace advice given to you by your health care provider. Make sure you discuss any questions you have with your health care provider. Document Released: 01/10/2005 Document Revised: 10/21/2015 Document Reviewed: 09/14/2015 Elsevier Interactive Patient Education  [employer]2018 Elsevier Inc.

## 2017-06-28 NOTE — MAU Provider Note (Signed)
History     CSN: [id number]  Arrival date and time: 06/28/17 1813   First Provider Initiated Contact with Patient 06/28/17 1931      Chief Complaint  Patient presents with  . Abdominal Pain  . Nausea   HPI Bailey Carney is a 27 y.o. non pregnant female who presents with abdominal pain & nausea. Pain started over a week ago. Nothing has changed about her symptoms. She currently doesn't have a PCP d/t insurance issues.  Reports generalized abdominal pain that she describes as cramp like. Points to upper abdomen, bilateral sides, and lower abdomen. Rates pain 6/10. Has not treated symptoms. Endorses some nausea but no vomiting. Was constipated earlier this week but was able to have a BM yesterday. Denies fever/chills, dysuria, or vaginal bleeding. Has noted some yellow malodorous discharge. She is sexually active with the same partner for the last 4 years. Denies hx of STIs with this partner. Denies dyspareunia or post coital bleeding. States her menstrual cycle was 2 weeks late last month and shorter than normal. Requesting a blood pregnancy test b/c she states her cousin had a negative UPT then came back with an ectopic pregnancy.   Past Medical History:  Diagnosis Date  . Asthma   . Genital herpes   . Hypertension     Past Surgical History:  Procedure Laterality Date  . NO PAST SURGERIES      Family History  Problem Relation Age of Onset  . Hypertension Mother   . Breast cancer Maternal Grandmother   . Hypertension Maternal Grandmother   . Breast cancer Paternal Grandmother     Social History   Tobacco Use  . Smoking status: Never Smoker  . Smokeless tobacco: Never Used  Substance Use Topics  . Alcohol use: Yes    Comment: socially  . Drug use: No    Allergies:  Allergies  Allergen Reactions  . Amoxicillin Rash and Other (See Comments)    Has patient had a PCN reaction causing immediate rash, facial/tongue/throat swelling, SOB or lightheadedness with  hypotension: No Has patient had a PCN reaction causing severe rash involving mucus membranes or skin necrosis: No Has patient had a PCN reaction that required hospitalization No Has patient had a PCN reaction occurring within the last 10 years: No If all of the above answers are "NO", then may proceed with Cephalosporin use.     Medications Prior to Admission  Medication Sig Dispense Refill Last Dose  . albuterol (PROVENTIL HFA;VENTOLIN HFA) 108 (90 Base) MCG/ACT inhaler Inhale 2 puffs into the lungs every 4 (four) hours as needed for wheezing or shortness of breath. 1 Inhaler 0   . cyclobenzaprine (FLEXERIL) 10 MG tablet Take 1 tablet (10 mg total) by mouth 2 (two) times daily as needed for muscle spasms. 20 tablet 0   . hydrochlorothiazide (HYDRODIURIL) 12.5 MG tablet Take 1 tablet (12.5 mg total) by mouth daily. 30 tablet 0   . ibuprofen (ADVIL,MOTRIN) 800 MG tablet Take 1 tablet (800 mg total) by mouth 3 (three) times daily. 21 tablet 0   . naproxen (NAPROSYN) 500 MG tablet Take 1 tablet (500 mg total) by mouth 2 (two) times daily. 30 tablet 0   . predniSONE (DELTASONE) 50 MG tablet 1 tab po daily for 6 days. Take with food. 6 tablet 0     Review of Systems  Constitutional: Negative for appetite change, chills and fever.  Gastrointestinal: Positive for abdominal pain, constipation and nausea. Negative for blood in stool, diarrhea  and vomiting.  Genitourinary: Positive for vaginal discharge. Negative for dyspareunia, dysuria and vaginal bleeding.   Physical Exam   Blood pressure 130/87, pulse 72, temperature 98.5 F (36.9 C), temperature source Oral, resp. rate 18, height 5\' 6"  (1.676 m), weight 228 lb (103.4 kg), last menstrual period 05/07/2017, SpO2 100 %.  Physical Exam  Nursing note and vitals reviewed. Constitutional: She is oriented to person, place, and time. She appears well-developed and well-nourished. No distress.  HENT:  Head: Normocephalic and atraumatic.  Eyes:  Conjunctivae are normal. Right eye exhibits no discharge. Left eye exhibits no discharge. No scleral icterus.  Neck: Normal range of motion.  Cardiovascular: Normal rate, regular rhythm and normal heart sounds.  No murmur heard. Respiratory: Effort normal and breath sounds normal. No respiratory distress. She has no wheezes.  GI: Soft. Bowel sounds are normal. She exhibits no distension. There is tenderness in the left lower quadrant. There is no rigidity, no rebound, no guarding and negative Murphy's sign.  Genitourinary: Uterus normal. Cervix exhibits no motion tenderness. Right adnexum displays no mass and no tenderness. Left adnexum displays no mass and no tenderness.  Neurological: She is alert and oriented to person, place, and time.  Skin: Skin is warm and dry. She is not diaphoretic.  Psychiatric: She has a normal mood and affect. Her behavior is normal. Judgment and thought content normal.    MAU Course  Procedures Results for orders placed or performed during the hospital encounter of 06/28/17 (from the past 24 hour(s))  Urinalysis, Routine w reflex microscopic     Status: Abnormal   Collection Time: 06/28/17  6:35 PM  Result Value Ref Range   Color, Urine YELLOW YELLOW   APPearance CLEAR CLEAR   Specific Gravity, Urine 1.029 1.005 - 1.030   pH 6.0 5.0 - 8.0   Glucose, UA NEGATIVE NEGATIVE mg/dL   Hgb urine dipstick NEGATIVE NEGATIVE   Bilirubin Urine NEGATIVE NEGATIVE   Ketones, ur 5 (A) NEGATIVE mg/dL   Protein, ur NEGATIVE NEGATIVE mg/dL   Nitrite NEGATIVE NEGATIVE   Leukocytes, UA NEGATIVE NEGATIVE  Pregnancy, urine POC     Status: None   Collection Time: 06/28/17  7:13 PM  Result Value Ref Range   Preg Test, Ur NEGATIVE NEGATIVE  CBC     Status: Abnormal   Collection Time: 06/28/17  8:05 PM  Result Value Ref Range   WBC 11.4 (H) 4.0 - 10.5 K/uL   RBC 4.62 3.87 - 5.11 MIL/uL   Hemoglobin 13.8 12.0 - 15.0 g/dL   HCT 16.1 09.6 - 04.5 %   MCV 87.2 78.0 - 100.0 fL    MCH 29.9 26.0 - 34.0 pg   MCHC 34.2 30.0 - 36.0 g/dL   RDW 40.9 81.1 - 91.4 %   Platelets 208 150 - 400 K/uL  Comprehensive metabolic panel     Status: Abnormal   Collection Time: 06/28/17  8:05 PM  Result Value Ref Range   Sodium 134 (L) 135 - 145 mmol/L   Potassium 3.6 3.5 - 5.1 mmol/L   Chloride 99 (L) 101 - 111 mmol/L   CO2 26 22 - 32 mmol/L   Glucose, Bld 88 65 - 99 mg/dL   BUN 21 (H) 6 - 20 mg/dL   Creatinine, Ser 7.82 0.44 - 1.00 mg/dL   Calcium 9.0 8.9 - 95.6 mg/dL   Total Protein 7.7 6.5 - 8.1 g/dL   Albumin 4.2 3.5 - 5.0 g/dL   AST 18 15 - 41 U/L  ALT 15 14 - 54 U/L   Alkaline Phosphatase 44 38 - 126 U/L   Total Bilirubin 0.7 0.3 - 1.2 mg/dL   GFR calc non Af Amer >60 >60 mL/min   GFR calc Af Amer >60 >60 mL/min   Anion gap 9 5 - 15  hCG, serum, qualitative     Status: None   Collection Time: 06/28/17  8:05 PM  Result Value Ref Range   Preg, Serum NEGATIVE NEGATIVE  Wet prep, genital     Status: Abnormal   Collection Time: 06/28/17  8:10 PM  Result Value Ref Range   Yeast Wet Prep HPF POC NONE SEEN NONE SEEN   Trich, Wet Prep NONE SEEN NONE SEEN   Clue Cells Wet Prep HPF POC PRESENT (A) NONE SEEN   WBC, Wet Prep HPF POC FEW (A) NONE SEEN   Sperm NONE SEEN     MDM UPT negative. HCG qual ordered and negative.  CBC & CMP ordered. Pt afebrile.  Reglan 10 mg PO VSS, NAD. Discussed results with patient. Will tx BV & rx antiemetic.   Assessment and Plan  A: 1. Generalized abdominal pain   2. Pregnancy examination or test, negative result   3. BV (bacterial vaginosis)    P: Discharge home Rx flagyl (no alochol) & reglan Discussed reasons to return to MAU vs ED [facility] resource list given -- establish care with PCP  GC/CT pending   Judeth Horn 06/28/2017, 7:31 PM

## 2017-06-28 NOTE — MAU Note (Signed)
Pt states she has been having bad stomach pain for 1 1/2 weeks. Pain is in her lower stomach and right side. Nausea but no vomiting.

## 2017-07-01 LAB — GC/CHLAMYDIA PROBE AMP (~~LOC~~) NOT AT ARMC
Chlamydia: POSITIVE — AB
Neisseria Gonorrhea: NEGATIVE

## 2017-07-02 ENCOUNTER — Telehealth: Payer: Self-pay | Admitting: Medical

## 2017-07-02 DIAGNOSIS — A749 Chlamydial infection, unspecified: Secondary | ICD-10-CM

## 2017-07-02 MED ORDER — AZITHROMYCIN 250 MG PO TABS: 1000.0000 mg | ORAL_TABLET | Freq: Once | ORAL | 0 refills | Status: AC

## 2017-07-02 NOTE — Telephone Encounter (Addendum)
Bailey BudgeBrooke K Carney tested positive for  Chlamydia. Patient was called by RN and allergies and pharmacy confirmed. Rx sent to pharmacy of choice.   Marny LowensteinWenzel, Charlsey Moragne N, PA-C 07/02/2017 12:11 PM      ----- Message from Kathe BectonLori S Berdik, RN sent at 07/02/2017 10:15 AM EDT ----- This patient tested positive for :  Chlamydia  She ,"is allergic to Amoxicillin"   I have informed the patient of her results and confirmed her pharmacy is correct in her chart. Please send Rx.   Thank you,   Kathe BectonBerdik, Lori S, RN   [facility]

## 2017-08-26 ENCOUNTER — Inpatient Hospital Stay (HOSPITAL_COMMUNITY): Payer: Medicaid Other

## 2017-08-26 ENCOUNTER — Encounter (HOSPITAL_COMMUNITY): Payer: Self-pay

## 2017-08-26 ENCOUNTER — Inpatient Hospital Stay (HOSPITAL_COMMUNITY)
Admission: AD | Admit: 2017-08-26 | Discharge: 2017-08-26 | Disposition: A | Discharge status: Home / Self Care | Payer: Medicaid Other | Source: Ambulatory Visit | Attending: Obstetrics and Gynecology | Admitting: Obstetrics and Gynecology

## 2017-08-26 ENCOUNTER — Other Ambulatory Visit: Payer: Self-pay

## 2017-08-26 DIAGNOSIS — O26891 Other specified pregnancy related conditions, first trimester: Secondary | ICD-10-CM | POA: Diagnosis not present

## 2017-08-26 DIAGNOSIS — Z79899 Other long term (current) drug therapy: Secondary | ICD-10-CM | POA: Diagnosis not present

## 2017-08-26 DIAGNOSIS — O26899 Other specified pregnancy related conditions, unspecified trimester: Secondary | ICD-10-CM

## 2017-08-26 DIAGNOSIS — O98811 Other maternal infectious and parasitic diseases complicating pregnancy, first trimester: Secondary | ICD-10-CM | POA: Insufficient documentation

## 2017-08-26 DIAGNOSIS — O3680X0 Pregnancy with inconclusive fetal viability, not applicable or unspecified: Secondary | ICD-10-CM

## 2017-08-26 DIAGNOSIS — J45909 Unspecified asthma, uncomplicated: Secondary | ICD-10-CM | POA: Diagnosis not present

## 2017-08-26 DIAGNOSIS — R109 Unspecified abdominal pain: Secondary | ICD-10-CM

## 2017-08-26 DIAGNOSIS — Z7952 Long term (current) use of systemic steroids: Secondary | ICD-10-CM | POA: Insufficient documentation

## 2017-08-26 DIAGNOSIS — O99511 Diseases of the respiratory system complicating pregnancy, first trimester: Secondary | ICD-10-CM | POA: Diagnosis not present

## 2017-08-26 DIAGNOSIS — B3731 Acute candidiasis of vulva and vagina: Secondary | ICD-10-CM

## 2017-08-26 DIAGNOSIS — O10911 Unspecified pre-existing hypertension complicating pregnancy, first trimester: Secondary | ICD-10-CM | POA: Diagnosis not present

## 2017-08-26 DIAGNOSIS — B373 Candidiasis of vulva and vagina: Secondary | ICD-10-CM | POA: Insufficient documentation

## 2017-08-26 DIAGNOSIS — Z8249 Family history of ischemic heart disease and other diseases of the circulatory system: Secondary | ICD-10-CM | POA: Insufficient documentation

## 2017-08-26 DIAGNOSIS — Z880 Allergy status to penicillin: Secondary | ICD-10-CM | POA: Diagnosis not present

## 2017-08-26 DIAGNOSIS — Z3A01 Less than 8 weeks gestation of pregnancy: Secondary | ICD-10-CM

## 2017-08-26 DIAGNOSIS — I10 Essential (primary) hypertension: Secondary | ICD-10-CM

## 2017-08-26 LAB — URINALYSIS, ROUTINE W REFLEX MICROSCOPIC
BILIRUBIN URINE: NEGATIVE
GLUCOSE, UA: NEGATIVE mg/dL
HGB URINE DIPSTICK: NEGATIVE
Ketones, ur: 20 mg/dL — AB
NITRITE: NEGATIVE
PH: 6 (ref 5.0–8.0)
Protein, ur: NEGATIVE mg/dL
SPECIFIC GRAVITY, URINE: 1.027 (ref 1.005–1.030)

## 2017-08-26 LAB — CBC WITH DIFFERENTIAL/PLATELET
BASOS ABS: 0 10*3/uL (ref 0.0–0.1)
Basophils Relative: 0 %
EOS PCT: 0 %
Eosinophils Absolute: 0 10*3/uL (ref 0.0–0.7)
HCT: 38.4 % (ref 36.0–46.0)
Hemoglobin: 13 g/dL (ref 12.0–15.0)
Lymphocytes Relative: 35 %
Lymphs Abs: 3.1 10*3/uL (ref 0.7–4.0)
MCH: 29.8 pg (ref 26.0–34.0)
MCHC: 33.9 g/dL (ref 30.0–36.0)
MCV: 88.1 fL (ref 78.0–100.0)
MONO ABS: 0.3 10*3/uL (ref 0.1–1.0)
Monocytes Relative: 3 %
Neutro Abs: 5.6 10*3/uL (ref 1.7–7.7)
Neutrophils Relative %: 62 %
PLATELETS: 214 10*3/uL (ref 150–400)
RBC: 4.36 MIL/uL (ref 3.87–5.11)
RDW: 12.5 % (ref 11.5–15.5)
WBC: 9 10*3/uL (ref 4.0–10.5)

## 2017-08-26 LAB — HCG, QUANTITATIVE, PREGNANCY: hCG, Beta Chain, Quant, S: 3087 m[IU]/mL — ABNORMAL HIGH (ref <5)

## 2017-08-26 LAB — COMPREHENSIVE METABOLIC PANEL
ALBUMIN: 4.2 g/dL (ref 3.5–5.0)
ALK PHOS: 31 U/L — AB (ref 38–126)
ALT: 20 U/L (ref 14–54)
ANION GAP: 7 (ref 5–15)
AST: 22 U/L (ref 15–41)
BILIRUBIN TOTAL: 0.5 mg/dL (ref 0.3–1.2)
BUN: 11 mg/dL (ref 6–20)
CALCIUM: 8.9 mg/dL (ref 8.9–10.3)
CO2: 25 mmol/L (ref 22–32)
Chloride: 103 mmol/L (ref 101–111)
Creatinine, Ser: 0.72 mg/dL (ref 0.44–1.00)
GFR calc Af Amer: >60 mL/min (ref >60)
GLUCOSE: 86 mg/dL (ref 65–99)
POTASSIUM: 3.7 mmol/L (ref 3.5–5.1)
Sodium: 135 mmol/L (ref 135–145)
TOTAL PROTEIN: 7.4 g/dL (ref 6.5–8.1)

## 2017-08-26 LAB — POCT PREGNANCY, URINE: PREG TEST UR: POSITIVE — AB

## 2017-08-26 LAB — WET PREP, GENITAL
Clue Cells Wet Prep HPF POC: NONE SEEN
SPERM: NONE SEEN
TRICH WET PREP: NONE SEEN

## 2017-08-26 LAB — ABO/RH: ABO/RH(D): B POS

## 2017-08-26 MED ORDER — TERCONAZOLE 0.4 % VA CREA: 1.0000 | TOPICAL_CREAM | Freq: Every day | VAGINAL | 0 refills | Status: DC

## 2017-08-26 MED ORDER — LABETALOL HCL 100 MG PO TABS: 100.0000 mg | ORAL_TABLET | Freq: Two times a day (BID) | ORAL | 1 refills | Status: DC

## 2017-08-26 NOTE — Discharge Instructions (Signed)
Abdominal Pain During Pregnancy °Abdominal pain is common in pregnancy. Most of the time, it does not cause harm. There are many causes of abdominal pain. Some causes are more serious than others and sometimes the cause is not known. Abdominal pain can be a sign that something is very wrong with the pregnancy or the pain may have nothing to do with the pregnancy. Always tell your health care provider if you have any abdominal pain. °Follow these instructions at home: °· Do not have sex or put anything in your vagina until your symptoms go away completely. °· Watch your abdominal pain for any changes. °· Get plenty of rest until your pain improves. °· Drink enough fluid to keep your urine clear or pale yellow. °· Take over-the-counter or prescription medicines only as told by your health care provider. °· Keep all follow-up visits as told by your health care provider. This is important. °Contact a health care provider if: °· You have a fever. °· Your pain gets worse or you have cramping. °· Your pain continues after resting. °Get help right away if: °· You are bleeding, leaking fluid, or passing tissue from the vagina. °· You have vomiting or diarrhea that does not go away. °· You have painful or bloody urination. °· You notice a decrease in your baby's movements. °· You feel very weak or faint. °· You have shortness of breath. °· You develop a severe headache with abdominal pain. °· You have abnormal vaginal discharge with abdominal pain. °This information is not intended to replace advice given to you by your health care provider. Make sure you discuss any questions you have with your health care provider. °Document Released: 04/02/2005 Document Revised: 01/12/2016 Document Reviewed: 10/30/2012 °Elsevier Interactive Patient Education © 2018 [employer] ° °Pelvic Rest °Pelvic rest may be recommended if: °· Your placenta is partially or completely covering the opening of your cervix (placenta previa). °· There is  bleeding between the wall of the uterus and the amniotic sac in the first trimester of pregnancy (subchorionic hemorrhage). °· You went into labor too early (preterm labor). ° °Based on your overall health and the health of your baby, your health care provider will decide if pelvic rest is right for you. °How do I rest my pelvis? °For as long as told by your health care provider: °· Do not have sex, sexual stimulation, or an orgasm. °· Do not use tampons. Do not douche. Do not put anything in your vagina. °· Do not lift anything that is heavier than 10 lb (4.5 kg). °· Avoid activities that take a lot of effort (are strenuous). °· Avoid any activity in which your pelvic muscles could become strained. ° °When should I seek medical care? °Seek medical care if you have: °· Cramping pain in your lower abdomen. °· Vaginal discharge. °· A low, dull backache. °· Regular contractions. °· Uterine tightening. ° °When should I seek immediate medical care? °Seek immediate medical care if: °· You have vaginal bleeding and you are pregnant. ° °This information is not intended to replace advice given to you by your health care provider. Make sure you discuss any questions you have with your health care provider. °Document Released: 07/28/2010 Document Revised: 09/08/2015 Document Reviewed: 10/04/2014 °Elsevier Interactive Patient Education © 2018 [employer] ° °

## 2017-08-26 NOTE — MAU Provider Note (Signed)
History     CSN: [id number]  Arrival date and time: 08/26/17 1649   First Provider Initiated Contact with Patient 08/26/17 1757      Chief Complaint  Patient presents with  . Abdominal Pain   HPI   [name] is a 27 y.o. female with a history of preexisting HTN,  G1P0000 @ [redacted]w[redacted]d here in MAU with complaints of abdominal pain.Says the pain started this morning around 1100. Says the pain feels like gas pain. It comes and goes. The pain is located in the middle of her abdomen. Says she has not taken any medication for the pain. Says she rates her abdominal pain 2/10 currently. No bleeding.  Says she is currently taking HCTZ 1 x per day. Has not taken it because she was not sure if it was safe to take in pregnancy.   OB History    Gravida  1   Para  0   Term  0   Preterm  0   AB  0   Living  0     SAB  0   TAB  0   Ectopic  0   Multiple  0   Live Births              Past Medical History:  Diagnosis Date  . Asthma   . Genital herpes   . Hypertension     Past Surgical History:  Procedure Laterality Date  . NO PAST SURGERIES      Family History  Problem Relation Age of Onset  . Hypertension Mother   . Breast cancer Maternal Grandmother   . Hypertension Maternal Grandmother   . Breast cancer Paternal Grandmother     Social History   Tobacco Use  . Smoking status: Never Smoker  . Smokeless tobacco: Never Used  Substance Use Topics  . Alcohol use: Yes    Comment: socially  . Drug use: No    Allergies:  Allergies  Allergen Reactions  . Amoxicillin Rash and Other (See Comments)    Has patient had a PCN reaction causing immediate rash, facial/tongue/throat swelling, SOB or lightheadedness with hypotension: No Has patient had a PCN reaction causing severe rash involving mucus membranes or skin necrosis: No Has patient had a PCN reaction that required hospitalization No Has patient had a PCN reaction occurring within the last 10 years:  No If all of the above answers are "NO", then may proceed with Cephalosporin use.     Medications Prior to Admission  Medication Sig Dispense Refill Last Dose  . albuterol (PROVENTIL HFA;VENTOLIN HFA) 108 (90 Base) MCG/ACT inhaler Inhale 2 puffs into the lungs every 4 (four) hours as needed for wheezing or shortness of breath. 1 Inhaler 0   . cyclobenzaprine (FLEXERIL) 10 MG tablet Take 1 tablet (10 mg total) by mouth 2 (two) times daily as needed for muscle spasms. 20 tablet 0   . hydrochlorothiazide (HYDRODIURIL) 12.5 MG tablet Take 1 tablet (12.5 mg total) by mouth daily. 30 tablet 0   . ibuprofen (ADVIL,MOTRIN) 800 MG tablet Take 1 tablet (800 mg total) by mouth 3 (three) times daily. 21 tablet 0   . metoCLOPramide (REGLAN) 10 MG tablet Take 1 tablet (10 mg total) by mouth every 6 (six) hours. 30 tablet 0   . metroNIDAZOLE (FLAGYL) 500 MG tablet Take 1 tablet (500 mg total) by mouth 2 (two) times daily. 14 tablet 0   . naproxen (NAPROSYN) 500 MG tablet Take 1 tablet (500 mg   total) by mouth 2 (two) times daily. 30 tablet 0   . predniSONE (DELTASONE) 50 MG tablet 1 tab po daily for 6 days. Take with food. 6 tablet 0     Results for orders placed or performed during the hospital encounter of 08/26/17 (from the past 48 hour(s))  Urinalysis, Routine w reflex microscopic     Status: Abnormal   Collection Time: 08/26/17  5:00 PM  Result Value Ref Range   Color, Urine YELLOW YELLOW   APPearance HAZY (A) CLEAR   Specific Gravity, Urine 1.027 1.005 - 1.030   pH 6.0 5.0 - 8.0   Glucose, UA NEGATIVE NEGATIVE mg/dL   Hgb urine dipstick NEGATIVE NEGATIVE   Bilirubin Urine NEGATIVE NEGATIVE   Ketones, ur 20 (A) NEGATIVE mg/dL   Protein, ur NEGATIVE NEGATIVE mg/dL   Nitrite NEGATIVE NEGATIVE   Leukocytes, UA LARGE (A) NEGATIVE   RBC / HPF 11-20 0 - 5 RBC/hpf   WBC, UA 0-5 0 - 5 WBC/hpf   Bacteria, UA RARE (A) NONE SEEN   Squamous Epithelial / LPF 6-10 0 - 5   Mucus PRESENT    Budding Yeast  PRESENT     Comment: Performed at [facility], [address] [place], [place] [address]  Pregnancy, urine POC     Status: Abnormal   Collection Time: 08/26/17  5:10 PM  Result Value Ref Range   Preg Test, Ur POSITIVE (A) NEGATIVE    Comment:        THE SENSITIVITY OF THIS METHODOLOGY IS >24 mIU/mL   CBC with Differential/Platelet     Status: None   Collection Time: 08/26/17  6:02 PM  Result Value Ref Range   WBC 9.0 4.0 - 10.5 K/uL   RBC 4.36 3.87 - 5.11 MIL/uL   Hemoglobin 13.0 12.0 - 15.0 g/dL   HCT 38.4 36.0 - 46.0 %   MCV 88.1 78.0 - 100.0 fL   MCH 29.8 26.0 - 34.0 pg   MCHC 33.9 30.0 - 36.0 g/dL   RDW 12.5 11.5 - 15.5 %   Platelets 214 150 - 400 K/uL   Neutrophils Relative % 62 %   Neutro Abs 5.6 1.7 - 7.7 K/uL   Lymphocytes Relative 35 %   Lymphs Abs 3.1 0.7 - 4.0 K/uL   Monocytes Relative 3 %   Monocytes Absolute 0.3 0.1 - 1.0 K/uL   Eosinophils Relative 0 %   Eosinophils Absolute 0.0 0.0 - 0.7 K/uL   Basophils Relative 0 %   Basophils Absolute 0.0 0.0 - 0.1 K/uL    Comment: Performed at [facility], [address] [place], [place] [address]  Comprehensive metabolic panel     Status: Abnormal   Collection Time: 08/26/17  6:02 PM  Result Value Ref Range   Sodium 135 135 - 145 mmol/L   Potassium 3.7 3.5 - 5.1 mmol/L   Chloride 103 101 - 111 mmol/L   CO2 25 22 - 32 mmol/L   Glucose, Bld 86 65 - 99 mg/dL   BUN 11 6 - 20 mg/dL   Creatinine, Ser 0.72 0.44 - 1.00 mg/dL   Calcium 8.9 8.9 - 10.3 mg/dL   Total Protein 7.4 6.5 - 8.1 g/dL   Albumin 4.2 3.5 - 5.0 g/dL   AST 22 15 - 41 U/L   ALT 20 14 - 54 U/L   Alkaline Phosphatase 31 (L) 38 - 126 U/L   Total Bilirubin 0.5 0.3 - 1.2 mg/dL   GFR calc non Af   Amer >60 >60 mL/min   GFR calc Af Amer >60 >60 mL/min    Comment: (NOTE) The eGFR has been calculated using the CKD EPI equation. This calculation has not been validated in all clinical situations. eGFR's persistently <60 mL/min signify possible Chronic  Kidney Disease.    Anion gap 7 5 - 15    Comment: Performed at [facility], [address] [place], [place] [address]  ABO/Rh     Status: None   Collection Time: 08/26/17  6:02 PM  Result Value Ref Range   ABO/RH(D)      B POS Performed at [facility], [address] [place], [place] [address]   hCG, quantitative, pregnancy     Status: Abnormal   Collection Time: 08/26/17  6:02 PM  Result Value Ref Range   hCG, Beta Chain, Quant, S 3,087 (H) <5 mIU/mL    Comment:          GEST. AGE      CONC.  (mIU/mL)   <=1 WEEK        5 - 50     2 WEEKS       50 - 500     3 WEEKS       100 - 10,000     4 WEEKS     1,000 - 30,000     5 WEEKS     3,500 - 115,000   6-8 WEEKS     12,000 - 270,000    12 WEEKS     15,000 - 220,000        FEMALE AND NON-PREGNANT FEMALE:     LESS THAN 5 mIU/mL Performed at [facility], [address] [place], [place] [address]   Wet prep, genital     Status: Abnormal   Collection Time: 08/26/17  6:18 PM  Result Value Ref Range   Yeast Wet Prep HPF POC PRESENT (A) NONE SEEN   Trich, Wet Prep NONE SEEN NONE SEEN   Clue Cells Wet Prep HPF POC NONE SEEN NONE SEEN   WBC, Wet Prep HPF POC FEW (A) NONE SEEN    Comment: MANY BACTERIA SEEN   Sperm NONE SEEN     Comment: Performed at [facility], [address] [place],  [address]    Review of Systems  Constitutional: Negative for fever.  Gastrointestinal: Positive for abdominal pain and nausea. Negative for vomiting.  Genitourinary: Negative for dysuria and vaginal discharge.   Physical Exam   Blood pressure (!) 143/93, pulse 83, temperature 98.2 F (36.8 C), temperature source Oral, resp. rate 16, weight 227 lb 12 oz (103.3 kg), last menstrual period 07/14/2017, SpO2 98 %.  Physical Exam  Constitutional: She is oriented to person, place, and time. She appears well-developed and well-nourished. No distress.  HENT:  Head: Normocephalic.  Eyes: Pupils are equal, round, and reactive  to light.  Neck: Neck supple.  GI: Soft. She exhibits no distension. There is no tenderness. There is no rebound.  Genitourinary:  Genitourinary Comments: Wet prep and GC collected by RN  Musculoskeletal: Normal range of motion.  Neurological: She is alert and oriented to person, place, and time.  Skin: Skin is warm. She is not diaphoretic.  Psychiatric: Her behavior is normal.    MAU Course  Procedures  None  MDM  Wet prep & GC HIV, CBC, Hcg, ABO US OB transvaginal   Assessment and Plan   A:  1. Pregnancy of unknown anatomic location   2. Abdominal pain   in pregnancy   3. [redacted] weeks gestation of pregnancy   4. Vaginal yeast infection   5. Essential hypertension     P:  Discharge home with strict return precautions Ectopic precautions Follow up on Thursday in the [facility] for repeat blood work and BP check. Appointment made in the yellow book for 0900.  Stop Hctz Rx: Labetalol 100 mg BID, Diflucan Pelvic rest Return to MAU if symptoms worse  ,  I, NP 08/28/2017 12:29 PM  

## 2017-08-26 NOTE — MAU Note (Signed)
Pt states that she had 3 positive pregnancy test this morning. Pt states that she started having light cramps this morning. She is rating the pain 2/10 intermittent.

## 2017-08-27 LAB — GC/CHLAMYDIA PROBE AMP (~~LOC~~) NOT AT ARMC
CHLAMYDIA, DNA PROBE: NEGATIVE
NEISSERIA GONORRHEA: NEGATIVE

## 2017-08-29 ENCOUNTER — Ambulatory Visit (INDEPENDENT_AMBULATORY_CARE_PROVIDER_SITE_OTHER): Payer: Medicaid Other | Admitting: General Practice

## 2017-08-29 ENCOUNTER — Encounter: Payer: Self-pay | Admitting: Obstetrics and Gynecology

## 2017-08-29 DIAGNOSIS — O283 Abnormal ultrasonic finding on antenatal screening of mother: Secondary | ICD-10-CM

## 2017-08-29 DIAGNOSIS — O3680X0 Pregnancy with inconclusive fetal viability, not applicable or unspecified: Secondary | ICD-10-CM

## 2017-08-29 LAB — HCG, QUANTITATIVE, PREGNANCY: hCG, Beta Chain, Quant, S: 8154 m[IU]/mL — ABNORMAL HIGH (ref <5)

## 2017-08-29 NOTE — Progress Notes (Signed)
Chart reviewed for nurse visit. Agree with plan of care.   Judeth Horn, NP 08/29/2017 11:53 AM

## 2017-08-29 NOTE — Progress Notes (Signed)
Patient here for stat bhcg today. Patient denies pain or bleeding. Discussed with patient we are monitoring her bhcg levels today and asked she wait in lobby for results/updated plan of care. Patient verbalized understanding & had no questions at this time.  Reviewed results with Judeth Horn who finds appropriate rise in bhcg levels, patient needs follow up ultrasound in 1 week.  Informed patient of results & scheduled ultrasound for 5/23. Recommended she make new OB appt at our office given HTN. Patient verbalized understanding & had no questions.

## 2017-09-05 ENCOUNTER — Ambulatory Visit (HOSPITAL_COMMUNITY)
Admission: RE | Admit: 2017-09-05 | Discharge: 2017-09-05 | Disposition: A | Discharge status: Home / Self Care | Payer: Medicaid Other | Source: Ambulatory Visit | Attending: Student | Admitting: Student

## 2017-09-05 DIAGNOSIS — Z3A01 Less than 8 weeks gestation of pregnancy: Secondary | ICD-10-CM | POA: Insufficient documentation

## 2017-09-05 DIAGNOSIS — Z369 Encounter for antenatal screening, unspecified: Secondary | ICD-10-CM | POA: Insufficient documentation

## 2017-09-05 DIAGNOSIS — O3680X0 Pregnancy with inconclusive fetal viability, not applicable or unspecified: Secondary | ICD-10-CM

## 2017-09-06 ENCOUNTER — Other Ambulatory Visit (HOSPITAL_COMMUNITY)
Admission: RE | Admit: 2017-09-06 | Discharge: 2017-09-06 | Disposition: A | Discharge status: Home / Self Care | Payer: Medicaid Other | Source: Ambulatory Visit | Attending: Obstetrics and Gynecology | Admitting: Obstetrics and Gynecology

## 2017-09-06 ENCOUNTER — Ambulatory Visit (INDEPENDENT_AMBULATORY_CARE_PROVIDER_SITE_OTHER): Payer: Medicaid Other | Admitting: Obstetrics and Gynecology

## 2017-09-06 VITALS — BP 142/90

## 2017-09-06 DIAGNOSIS — O10919 Unspecified pre-existing hypertension complicating pregnancy, unspecified trimester: Secondary | ICD-10-CM | POA: Insufficient documentation

## 2017-09-06 DIAGNOSIS — O219 Vomiting of pregnancy, unspecified: Secondary | ICD-10-CM

## 2017-09-06 DIAGNOSIS — O10911 Unspecified pre-existing hypertension complicating pregnancy, first trimester: Secondary | ICD-10-CM

## 2017-09-06 DIAGNOSIS — Z113 Encounter for screening for infections with a predominantly sexual mode of transmission: Secondary | ICD-10-CM

## 2017-09-06 DIAGNOSIS — O0991 Supervision of high risk pregnancy, unspecified, first trimester: Secondary | ICD-10-CM

## 2017-09-06 DIAGNOSIS — O0990 Supervision of high risk pregnancy, unspecified, unspecified trimester: Secondary | ICD-10-CM | POA: Insufficient documentation

## 2017-09-06 HISTORY — DX: Unspecified pre-existing hypertension complicating pregnancy, unspecified trimester: O10.919

## 2017-09-06 MED ORDER — ONDANSETRON HCL 4 MG PO TABS: 4.0000 mg | ORAL_TABLET | Freq: Three times a day (TID) | ORAL | 0 refills | Status: DC | PRN

## 2017-09-06 NOTE — Progress Notes (Signed)
Patient presents to office today for ultrasound results. Per Dr Alysia Penna, ultrasound shows single living IUP- patient should begin prenatal care in 5-6 weeks and continue taking labetalol.   Informed patient of results, dating information & recommendation to start prenatal care in 5-6 weeks. BP performed today and only slightly elevated- encouraged patient to continue labetalol. Patient denies headaches, dizziness or blurry vision. Patient reports frequent nausea/vomiting. Rx for Zofran given per Dr Alysia Penna- patient informed. Patient also reports sexual intercourse since last STD testing & requests updated testing. Patient instructed in self swab and specimen collected. Patient had no other questions.   Agree with above plan of care

## 2017-09-10 LAB — CERVICOVAGINAL ANCILLARY ONLY
CHLAMYDIA, DNA PROBE: NEGATIVE
Neisseria Gonorrhea: NEGATIVE
TRICH (WINDOWPATH): NEGATIVE

## 2017-09-11 ENCOUNTER — Telehealth: Payer: Self-pay | Admitting: General Practice

## 2017-09-11 NOTE — Telephone Encounter (Signed)
Patient called and left message on nurse voicemail line requesting results. Called patient, no answer- left message on voicemail stating we are trying to reach you to return your phone call. Your recent results in our office were negative. You may call us back if you have questions.

## 2017-09-26 ENCOUNTER — Inpatient Hospital Stay (HOSPITAL_COMMUNITY)
Admission: AD | Admit: 2017-09-26 | Discharge: 2017-09-26 | Disposition: A | Discharge status: Home / Self Care | Payer: Medicaid Other | Source: Ambulatory Visit | Attending: Obstetrics & Gynecology | Admitting: Obstetrics & Gynecology

## 2017-09-26 ENCOUNTER — Other Ambulatory Visit: Payer: Self-pay

## 2017-09-26 ENCOUNTER — Encounter (HOSPITAL_COMMUNITY): Payer: Self-pay | Admitting: *Deleted

## 2017-09-26 DIAGNOSIS — R42 Dizziness and giddiness: Secondary | ICD-10-CM | POA: Diagnosis present

## 2017-09-26 DIAGNOSIS — Z3A08 8 weeks gestation of pregnancy: Secondary | ICD-10-CM

## 2017-09-26 DIAGNOSIS — M25551 Pain in right hip: Secondary | ICD-10-CM | POA: Diagnosis not present

## 2017-09-26 DIAGNOSIS — Z3A01 Less than 8 weeks gestation of pregnancy: Secondary | ICD-10-CM | POA: Diagnosis not present

## 2017-09-26 DIAGNOSIS — O161 Unspecified maternal hypertension, first trimester: Secondary | ICD-10-CM | POA: Diagnosis not present

## 2017-09-26 DIAGNOSIS — O99281 Endocrine, nutritional and metabolic diseases complicating pregnancy, first trimester: Secondary | ICD-10-CM | POA: Diagnosis not present

## 2017-09-26 DIAGNOSIS — O26891 Other specified pregnancy related conditions, first trimester: Secondary | ICD-10-CM | POA: Diagnosis not present

## 2017-09-26 DIAGNOSIS — O99511 Diseases of the respiratory system complicating pregnancy, first trimester: Secondary | ICD-10-CM | POA: Insufficient documentation

## 2017-09-26 DIAGNOSIS — E860 Dehydration: Secondary | ICD-10-CM | POA: Diagnosis not present

## 2017-09-26 DIAGNOSIS — G44209 Tension-type headache, unspecified, not intractable: Secondary | ICD-10-CM | POA: Insufficient documentation

## 2017-09-26 DIAGNOSIS — O9989 Other specified diseases and conditions complicating pregnancy, childbirth and the puerperium: Secondary | ICD-10-CM | POA: Insufficient documentation

## 2017-09-26 DIAGNOSIS — Z8619 Personal history of other infectious and parasitic diseases: Secondary | ICD-10-CM | POA: Diagnosis not present

## 2017-09-26 DIAGNOSIS — Z79899 Other long term (current) drug therapy: Secondary | ICD-10-CM | POA: Diagnosis not present

## 2017-09-26 DIAGNOSIS — Z8249 Family history of ischemic heart disease and other diseases of the circulatory system: Secondary | ICD-10-CM | POA: Insufficient documentation

## 2017-09-26 DIAGNOSIS — R51 Headache: Secondary | ICD-10-CM | POA: Diagnosis present

## 2017-09-26 HISTORY — DX: Chlamydial infection, unspecified: A74.9

## 2017-09-26 LAB — URINALYSIS, ROUTINE W REFLEX MICROSCOPIC
Bilirubin Urine: NEGATIVE
Glucose, UA: NEGATIVE mg/dL
Hgb urine dipstick: NEGATIVE
Ketones, ur: 5 mg/dL — AB
LEUKOCYTES UA: NEGATIVE
NITRITE: NEGATIVE
Protein, ur: NEGATIVE mg/dL
SPECIFIC GRAVITY, URINE: 1.025 (ref 1.005–1.030)
pH: 6 (ref 5.0–8.0)

## 2017-09-26 LAB — COMPREHENSIVE METABOLIC PANEL
ALT: 25 U/L (ref 14–54)
AST: 26 U/L (ref 15–41)
Albumin: 4 g/dL (ref 3.5–5.0)
Alkaline Phosphatase: 35 U/L — ABNORMAL LOW (ref 38–126)
Anion gap: 7 (ref 5–15)
BUN: 11 mg/dL (ref 6–20)
CHLORIDE: 104 mmol/L (ref 101–111)
CO2: 23 mmol/L (ref 22–32)
CREATININE: 0.57 mg/dL (ref 0.44–1.00)
Calcium: 9.3 mg/dL (ref 8.9–10.3)
GFR calc Af Amer: >60 mL/min (ref >60)
Glucose, Bld: 77 mg/dL (ref 65–99)
Potassium: 4.4 mmol/L (ref 3.5–5.1)
Sodium: 134 mmol/L — ABNORMAL LOW (ref 135–145)
Total Bilirubin: 0.6 mg/dL (ref 0.3–1.2)
Total Protein: 7.2 g/dL (ref 6.5–8.1)

## 2017-09-26 LAB — CBC
HEMATOCRIT: 35.6 % — AB (ref 36.0–46.0)
HEMOGLOBIN: 12.4 g/dL (ref 12.0–15.0)
MCH: 30.1 pg (ref 26.0–34.0)
MCHC: 34.8 g/dL (ref 30.0–36.0)
MCV: 86.4 fL (ref 78.0–100.0)
PLATELETS: 196 10*3/uL (ref 150–400)
RBC: 4.12 MIL/uL (ref 3.87–5.11)
RDW: 12.7 % (ref 11.5–15.5)
WBC: 12.2 10*3/uL — AB (ref 4.0–10.5)

## 2017-09-26 MED ORDER — DIPHENHYDRAMINE HCL 50 MG/ML IJ SOLN
25.0000 mg | Freq: Once | INTRAMUSCULAR | Status: AC
  Administered 2017-09-26: 25 mg via INTRAVENOUS
  Filled 2017-09-26: qty 1

## 2017-09-26 MED ORDER — DEXAMETHASONE SODIUM PHOSPHATE 10 MG/ML IJ SOLN
10.0000 mg | Freq: Once | INTRAMUSCULAR | Status: AC
  Administered 2017-09-26: 10 mg via INTRAVENOUS
  Filled 2017-09-26: qty 1

## 2017-09-26 MED ORDER — METOCLOPRAMIDE HCL 5 MG/ML IJ SOLN
10.0000 mg | Freq: Once | INTRAMUSCULAR | Status: AC
  Administered 2017-09-26: 10 mg via INTRAVENOUS
  Filled 2017-09-26: qty 2

## 2017-09-26 MED ORDER — METOCLOPRAMIDE HCL 10 MG PO TABS: 10.0000 mg | ORAL_TABLET | Freq: Four times a day (QID) | ORAL | 0 refills | Status: DC | PRN

## 2017-09-26 MED ORDER — LACTATED RINGERS IV BOLUS
1000.0000 mL | Freq: Once | INTRAVENOUS | Status: AC
  Administered 2017-09-26: 1000 mL via INTRAVENOUS

## 2017-09-26 NOTE — MAU Provider Note (Signed)
History     CSN: [id number]  Arrival date and time: 09/26/17 1712   First Provider Initiated Contact with Patient 09/26/17 1814      Chief Complaint  Patient presents with  . Headache  . Dizziness  . Hip Pain   G1 @8 .6 wks here with HA and right hip pain. HA started 3 days ago. Describes as frontal and intermittent. She took Tylenol and had little relief. Rates HA 7/10. Denies aura, photophobia, or visual changes. Reports feeling dizzy last night and almost passed out. She admits to poor hydration today, only 1/2 bottle of water. Complains of right hip pain for slightly longer than the HA. She sits most of the time while at work and sitting worsens the pain. No alleviating factors. No recent strenuous activity or injury.   OB History    Gravida  1   Para  0   Term  0   Preterm  0   AB  0   Living  0     SAB  0   TAB  0   Ectopic  0   Multiple  0   Live Births              Past Medical History:  Diagnosis Date  . Asthma   . Chlamydia   . Genital herpes   . Hypertension    on meds    Past Surgical History:  Procedure Laterality Date  . NO PAST SURGERIES      Family History  Problem Relation Age of Onset  . Hypertension Mother   . Breast cancer Maternal Grandmother   . Hypertension Maternal Grandmother   . Cancer Maternal Grandmother   . Breast cancer Paternal Grandmother   . Cancer Paternal Grandmother   . Cancer Maternal Grandfather     Social History   Tobacco Use  . Smoking status: Never Smoker  . Smokeless tobacco: Never Used  Substance Use Topics  . Alcohol use: Not Currently    Comment: socially prior to preg  . Drug use: No    Allergies:  Allergies  Allergen Reactions  . Amoxicillin Rash and Other (See Comments)    Has patient had a PCN reaction causing immediate rash, facial/tongue/throat swelling, SOB or lightheadedness with hypotension: No Has patient had a PCN reaction causing severe rash involving mucus membranes or  skin necrosis: No Has patient had a PCN reaction that required hospitalization No Has patient had a PCN reaction occurring within the last 10 years: No If all of the above answers are "NO", then may proceed with Cephalosporin use.     Medications Prior to Admission  Medication Sig Dispense Refill Last Dose  . albuterol (PROVENTIL HFA;VENTOLIN HFA) 108 (90 Base) MCG/ACT inhaler Inhale 2 puffs into the lungs every 4 (four) hours as needed for wheezing or shortness of breath. 1 Inhaler 0   . labetalol (NORMODYNE) 100 MG tablet Take 1 tablet (100 mg total) by mouth 2 (two) times daily. 60 tablet 1   . ondansetron (ZOFRAN) 4 MG tablet Take 1 tablet (4 mg total) by mouth every 8 (eight) hours as needed for nausea or vomiting. 30 tablet 0   . terconazole (TERAZOL 7) 0.4 % vaginal cream Place 1 applicator vaginally at bedtime. 45 g 0     Review of Systems  Constitutional: Negative for fever.  Eyes: Negative for photophobia and visual disturbance.  Gastrointestinal: Positive for nausea. Negative for abdominal distention and vomiting.  Genitourinary: Negative for vaginal bleeding.  Musculoskeletal: Positive for myalgias.  Neurological: Positive for dizziness and headaches. Negative for syncope.   Physical Exam   Blood pressure 139/78, pulse 69, temperature 99 F (37.2 C), temperature source Oral, resp. rate 16, weight 230 lb 8 oz (104.6 kg), last menstrual period 07/14/2017, SpO2 100 %.  Physical Exam  Constitutional: She is oriented to person, place, and time. She appears well-developed and well-nourished. No distress.  HENT:  Head: Normocephalic and atraumatic.  Eyes: Pupils are equal, round, and reactive to light. Conjunctivae, EOM and lids are normal. Lids are everted and swept, no foreign bodies found.  Neck: Normal range of motion.  Cardiovascular: Normal rate.  Respiratory: Effort normal. No respiratory distress.  Musculoskeletal: Normal range of motion.       Right hip: She  exhibits tenderness (with flexion). She exhibits normal range of motion, normal strength, no swelling and no deformity.  Neurological: She is alert and oriented to person, place, and time. No cranial nerve deficit.  Skin: Skin is warm and dry.  Psychiatric: She has a normal mood and affect.   Results for orders placed or performed during the hospital encounter of 09/26/17 (from the past 24 hour(s))  Urinalysis, Routine w reflex microscopic     Status: Abnormal   Collection Time: 09/26/17  5:46 PM  Result Value Ref Range   Color, Urine YELLOW YELLOW   APPearance CLEAR CLEAR   Specific Gravity, Urine 1.025 1.005 - 1.030   pH 6.0 5.0 - 8.0   Glucose, UA NEGATIVE NEGATIVE mg/dL   Hgb urine dipstick NEGATIVE NEGATIVE   Bilirubin Urine NEGATIVE NEGATIVE   Ketones, ur 5 (A) NEGATIVE mg/dL   Protein, ur NEGATIVE NEGATIVE mg/dL   Nitrite NEGATIVE NEGATIVE   Leukocytes, UA NEGATIVE NEGATIVE  CBC     Status: Abnormal   Collection Time: 09/26/17  6:51 PM  Result Value Ref Range   WBC 12.2 (H) 4.0 - 10.5 K/uL   RBC 4.12 3.87 - 5.11 MIL/uL   Hemoglobin 12.4 12.0 - 15.0 g/dL   HCT 09.835.6 (L) 11.936.0 - 14.746.0 %   MCV 86.4 78.0 - 100.0 fL   MCH 30.1 26.0 - 34.0 pg   MCHC 34.8 30.0 - 36.0 g/dL   RDW 82.912.7 56.211.5 - 13.015.5 %   Platelets 196 150 - 400 K/uL  Comprehensive metabolic panel     Status: Abnormal   Collection Time: 09/26/17  6:51 PM  Result Value Ref Range   Sodium 134 (L) 135 - 145 mmol/L   Potassium 4.4 3.5 - 5.1 mmol/L   Chloride 104 101 - 111 mmol/L   CO2 23 22 - 32 mmol/L   Glucose, Bld 77 65 - 99 mg/dL   BUN 11 6 - 20 mg/dL   Creatinine, Ser 8.650.57 0.44 - 1.00 mg/dL   Calcium 9.3 8.9 - 78.410.3 mg/dL   Total Protein 7.2 6.5 - 8.1 g/dL   Albumin 4.0 3.5 - 5.0 g/dL   AST 26 15 - 41 U/L   ALT 25 14 - 54 U/L   Alkaline Phosphatase 35 (L) 38 - 126 U/L   Total Bilirubin 0.6 0.3 - 1.2 mg/dL   GFR calc non Af Amer >60 >60 mL/min   GFR calc Af Amer >60 >60 mL/min   Anion gap 7 5 - 15   MAU  Course  Procedures LR Reglan Benadryl Decadron  MDM Labs ordered and reviewed. Normal neuro exam. Dehydration noted. HA improved after meds. Stable for discharge home.    Assessment and  Plan   1. [redacted] weeks gestation of pregnancy   2. Acute non intractable tension-type headache   3. Dehydration   4. Pain of right hip joint    Discharge home Follow up in OB office as scheduled Rx Reglan Hydrate Tylenol prn Heating pad prn  Allergies as of 09/26/2017      Reactions   Amoxicillin Rash, Other (See Comments)   Has patient had a PCN reaction causing immediate rash, facial/tongue/throat swelling, SOB or lightheadedness with hypotension: No Has patient had a PCN reaction causing severe rash involving mucus membranes or skin necrosis: No Has patient had a PCN reaction that required hospitalization No Has patient had a PCN reaction occurring within the last 10 years: No If all of the above answers are "NO", then may proceed with Cephalosporin use.      Medication List    TAKE these medications   albuterol 108 (90 Base) MCG/ACT inhaler Commonly known as:  PROVENTIL HFA;VENTOLIN HFA Inhale 2 puffs into the lungs every 4 (four) hours as needed for wheezing or shortness of breath.   labetalol 100 MG tablet Commonly known as:  NORMODYNE Take 1 tablet (100 mg total) by mouth 2 (two) times daily.   metoCLOPramide 10 MG tablet Commonly known as:  REGLAN Take 1 tablet (10 mg total) by mouth every 6 (six) hours as needed for nausea. May use for headache   ondansetron 4 MG tablet Commonly known as:  ZOFRAN Take 1 tablet (4 mg total) by mouth every 8 (eight) hours as needed for nausea or vomiting.   terconazole 0.4 % vaginal cream Commonly known as:  TERAZOL 7 Place 1 applicator vaginally at bedtime.      Donette Larry, CNM 09/26/2017, 7:43 PM

## 2017-09-26 NOTE — Discharge Instructions (Signed)

## 2017-09-26 NOTE — MAU Note (Signed)
Has had a headache for 3 days. Got real dizzy last night when in the shower, almost passed out, had to catch herself.  Has been having pain in her rt hip/lower abd for the past couple days.

## 2017-10-08 ENCOUNTER — Telehealth: Payer: Self-pay | Admitting: Obstetrics and Gynecology

## 2017-10-08 NOTE — Telephone Encounter (Signed)
Patient is requesting a call back today. She is not happy about not getting her ultrasound at her appointment on this coming Friday.

## 2017-10-10 NOTE — Telephone Encounter (Signed)
Called patient, no answer- left message stating we are trying to reach you to return your phone call regarding your appt. You may call us back if you have questions.

## 2017-10-11 ENCOUNTER — Encounter: Payer: Self-pay | Admitting: Obstetrics and Gynecology

## 2017-10-11 ENCOUNTER — Ambulatory Visit (INDEPENDENT_AMBULATORY_CARE_PROVIDER_SITE_OTHER): Payer: Medicaid Other | Admitting: Obstetrics and Gynecology

## 2017-10-11 ENCOUNTER — Other Ambulatory Visit (HOSPITAL_COMMUNITY)
Admission: RE | Admit: 2017-10-11 | Discharge: 2017-10-11 | Disposition: A | Discharge status: Home / Self Care | Payer: Medicaid Other | Source: Ambulatory Visit | Attending: Obstetrics and Gynecology | Admitting: Obstetrics and Gynecology

## 2017-10-11 VITALS — BP 139/94 | HR 96 | Wt 231.3 lb

## 2017-10-11 DIAGNOSIS — O0991 Supervision of high risk pregnancy, unspecified, first trimester: Secondary | ICD-10-CM

## 2017-10-11 DIAGNOSIS — O0990 Supervision of high risk pregnancy, unspecified, unspecified trimester: Secondary | ICD-10-CM

## 2017-10-11 DIAGNOSIS — J4520 Mild intermittent asthma, uncomplicated: Secondary | ICD-10-CM

## 2017-10-11 DIAGNOSIS — O10919 Unspecified pre-existing hypertension complicating pregnancy, unspecified trimester: Secondary | ICD-10-CM

## 2017-10-11 DIAGNOSIS — Z8619 Personal history of other infectious and parasitic diseases: Secondary | ICD-10-CM | POA: Insufficient documentation

## 2017-10-11 DIAGNOSIS — O10911 Unspecified pre-existing hypertension complicating pregnancy, first trimester: Secondary | ICD-10-CM

## 2017-10-11 DIAGNOSIS — Z3A00 Weeks of gestation of pregnancy not specified: Secondary | ICD-10-CM | POA: Diagnosis not present

## 2017-10-11 MED ORDER — LABETALOL HCL 200 MG PO TABS: 200.0000 mg | ORAL_TABLET | Freq: Two times a day (BID) | ORAL | 6 refills | Status: DC

## 2017-10-11 MED ORDER — ASPIRIN EC 81 MG PO TBEC: 81.0000 mg | DELAYED_RELEASE_TABLET | Freq: Every day | ORAL | 2 refills | Status: DC

## 2017-10-11 NOTE — Progress Notes (Signed)
Subjective:  Bailey Carney is a 27 y.o. G1P0000 at 57w0dbeing seen today for her first OB appt. EDD by first trimester U/S. H/O CHTN, asthma ( PRN MDI) and H/O HSV.  She is currently monitored for the following issues for this high-risk pregnancy and has Asthma; Hypertension; Chronic hypertension during pregnancy, antepartum; Supervision of high risk pregnancy, antepartum; and History of ELISA positive for HSV on their problem list.  Patient reports no complaints and occ cramps, N/V improving.  Contractions: Not present. Vag. Bleeding: None.  Movement: Absent. Denies leaking of fluid.   The following portions of the patient's history were reviewed and updated as appropriate: allergies, current medications, past family history, past medical history, past social history, past surgical history and problem list. Problem list updated.  Objective:   Vitals:   10/11/17 0846 10/11/17 0848  BP: (!) 157/92 (!) 139/94  Pulse: 96   Weight: 231 lb 4.8 oz (104.9 kg)     Fetal Status:     Movement: Absent     General:  Alert, oriented and cooperative. Patient is in no acute distress.  Skin: Skin is warm and dry. No rash noted.   Cardiovascular: Normal heart rate noted  Respiratory: Normal respiratory effort, no problems with respiration noted  Abdomen: Soft, gravid, appropriate for gestational age. Pain/Pressure: Present     Pelvic:  Cervical exam deferred        Extremities: Normal range of motion.  Edema: None  Mental Status: Normal mood and affect. Normal behavior. Normal judgment and thought content.   Urinalysis:      Assessment and Plan:  Pregnancy: G1P0000 at 110w0d1. Supervision of high risk pregnancy, antepartum Prenatal care and labs reviewed with pt. - CHL AMB BABYSCRIPTS OPT IN - Culture, OB Urine - Cystic fibrosis gene test - Cytology - PAP - Hemoglobinopathy Evaluation - Obstetric Panel, Including HIV - SMN1 COPY NUMBER ANALYSIS (SMA Carrier Screen) - [place] OB DETAIL +14  WK; Future  2. Chronic hypertension during pregnancy, antepartum CHTN and pregnancy reviewed with pt. Growth scans, antenatal testing and IOL at 39 weeks reviewed - aspirin EC 81 MG tablet; Take 1 tablet (81 mg total) by mouth daily. Take after 12 weeks for prevention of preeclampsia later in pregnancy  Dispense: 300 tablet; Refill: 2 - Comp Met (CMET) - Protein / creatinine ratio, urine - labetalol (NORMODYNE) 200 MG tablet; Take 1 tablet (200 mg total) by mouth 2 (two) times daily.  Dispense: 60 tablet; Refill: 6  3. Mild intermittent asthma without complication Stable Albuterol MDI PRN  4. History of ELISA positive for HSV Suppression at 36 weeks  Preterm labor symptoms and general obstetric precautions including but not limited to vaginal bleeding, contractions, leaking of fluid and fetal movement were reviewed in detail with the patient. Please refer to After Visit Summary for other counseling recommendations.  Return in about 1 month (around 11/08/2017) for OB visit.   ErChancy MilroyMD

## 2017-10-11 NOTE — Progress Notes (Signed)
Pt. States has been having a lot of pain & cramping since the 13th. Came to [facility]U was given IV was told that she was dehydrated.

## 2017-10-11 NOTE — Progress Notes (Signed)
Bedside ultrasound performed for FHR, FHR 173bpm with active fetus visualized.

## 2017-10-11 NOTE — Patient Instructions (Addendum)
First Trimester of Pregnancy The first trimester of pregnancy is from week 1 until the end of week 13 (months 1 through 3). A week after a sperm fertilizes an egg, the egg will implant on the wall of the uterus. This embryo will begin to develop into a baby. Genes from you and your partner will form the baby. The female genes will determine whether the baby will be a boy or a girl. At 6-8 weeks, the eyes and face will be formed, and the heartbeat can be seen on ultrasound. At the end of 12 weeks, all the baby's organs will be formed. Now that you are pregnant, you will want to do everything you can to have a healthy baby. Two of the most important things are to get good prenatal care and to follow your health care provider's instructions. Prenatal care is all the medical care you receive before the baby's birth. This care will help prevent, find, and treat any problems during the pregnancy and childbirth. Body changes during your first trimester Your body goes through many changes during pregnancy. The changes vary from woman to woman.  You may gain or lose a couple of pounds at first.  You may feel sick to your stomach (nauseous) and you may throw up (vomit). If the vomiting is uncontrollable, call your health care provider.  You may tire easily.  You may develop headaches that can be relieved by medicines. All medicines should be approved by your health care provider.  You may urinate more often. Painful urination may mean you have a bladder infection.  You may develop heartburn as a result of your pregnancy.  You may develop constipation because certain hormones are causing the muscles that push stool through your intestines to slow down.  You may develop hemorrhoids or swollen veins (varicose veins).  Your breasts may begin to grow larger and become tender. Your nipples may stick out more, and the tissue that surrounds them (areola) may become darker.  Your gums may bleed and may be  sensitive to brushing and flossing.  Dark spots or blotches (chloasma, mask of pregnancy) may develop on your face. This will likely fade after the baby is born.  Your menstrual periods will stop.  You may have a loss of appetite.  You may develop cravings for certain kinds of food.  You may have changes in your emotions from day to day, such as being excited to be pregnant or being concerned that something may go wrong with the pregnancy and baby.  You may have more vivid and strange dreams.  You may have changes in your hair. These can include thickening of your hair, rapid growth, and changes in texture. Some women also have hair loss during or after pregnancy, or hair that feels dry or thin. Your hair will most likely return to normal after your baby is born.  What to expect at prenatal visits During a routine prenatal visit:  You will be weighed to make sure you and the baby are growing normally.  Your blood pressure will be taken.  Your abdomen will be measured to track your baby's growth.  The fetal heartbeat will be listened to between weeks 10 and 14 of your pregnancy.  Test results from any previous visits will be discussed.  Your health care provider may ask you:  How you are feeling.  If you are feeling the baby move.  If you have had any abnormal symptoms, such as leaking fluid, bleeding, severe headaches,   or abdominal cramping.  If you are using any tobacco products, including cigarettes, chewing tobacco, and electronic cigarettes.  If you have any questions.  Other tests that may be performed during your first trimester include:  Blood tests to find your blood type and to check for the presence of any previous infections. The tests will also be used to check for low iron levels (anemia) and protein on red blood cells (Rh antibodies). Depending on your risk factors, or if you previously had diabetes during pregnancy, you may have tests to check for high blood  sugar that affects pregnant women (gestational diabetes).  Urine tests to check for infections, diabetes, or protein in the urine.  An ultrasound to confirm the proper growth and development of the baby.  Fetal screens for spinal cord problems (spina bifida) and Down syndrome.  HIV (human immunodeficiency virus) testing. Routine prenatal testing includes screening for HIV, unless you choose not to have this test.  You may need other tests to make sure you and the baby are doing well.  Follow these instructions at home: Medicines  Follow your health care provider's instructions regarding medicine use. Specific medicines may be either safe or unsafe to take during pregnancy.  Take a prenatal vitamin that contains at least 600 micrograms (mcg) of folic acid.  If you develop constipation, try taking a stool softener if your health care provider approves. Eating and drinking  Eat a balanced diet that includes fresh fruits and vegetables, whole grains, good sources of protein such as meat, eggs, or tofu, and low-fat dairy. Your health care provider will help you determine the amount of weight gain that is right for you.  Avoid raw meat and uncooked cheese. These carry germs that can cause birth defects in the baby.  Eating four or five small meals rather than three large meals a day may help relieve nausea and vomiting. If you start to feel nauseous, eating a few soda crackers can be helpful. Drinking liquids between meals, instead of during meals, also seems to help ease nausea and vomiting.  Limit foods that are high in fat and processed sugars, such as fried and sweet foods.  To prevent constipation: ? Eat foods that are high in fiber, such as fresh fruits and vegetables, whole grains, and beans. ? Drink enough fluid to keep your urine clear or pale yellow. Activity  Exercise only as directed by your health care provider. Most women can continue their usual exercise routine during  pregnancy. Try to exercise for 30 minutes at least 5 days a week. Exercising will help you: ? Control your weight. ? Stay in shape. ? Be prepared for labor and delivery.  Experiencing pain or cramping in the lower abdomen or lower back is a good sign that you should stop exercising. Check with your health care provider before continuing with normal exercises.  Try to avoid standing for long periods of time. Move your legs often if you must stand in one place for a long time.  Avoid heavy lifting.  Wear low-heeled shoes and practice good posture.  You may continue to have sex unless your health care provider tells you not to. Relieving pain and discomfort  Wear a good support bra to relieve breast tenderness.  Take warm sitz baths to soothe any pain or discomfort caused by hemorrhoids. Use hemorrhoid cream if your health care provider approves.  Rest with your legs elevated if you have leg cramps or low back pain.  If you develop   varicose veins in your legs, wear support hose. Elevate your feet for 15 minutes, 3-4 times a day. Limit salt in your diet. Prenatal care  Schedule your prenatal visits by the twelfth week of pregnancy. They are usually scheduled monthly at first, then more often in the last 2 months before delivery.  Write down your questions. Take them to your prenatal visits.  Keep all your prenatal visits as told by your health care provider. This is important. Safety  Wear your seat belt at all times when driving.  Make a list of emergency phone numbers, including numbers for family, friends, the hospital, and police and fire departments. General instructions  Ask your health care provider for a referral to a local prenatal education class. Begin classes no later than the beginning of month 6 of your pregnancy.  Ask for help if you have counseling or nutritional needs during pregnancy. Your health care provider can offer advice or refer you to specialists for help  with various needs.  Do not use hot tubs, steam rooms, or saunas.  Do not douche or use tampons or scented sanitary pads.  Do not cross your legs for long periods of time.  Avoid cat litter boxes and soil used by cats. These carry germs that can cause birth defects in the baby and possibly loss of the fetus by miscarriage or stillbirth.  Avoid all smoking, herbs, alcohol, and medicines not prescribed by your health care provider. Chemicals in these products affect the formation and growth of the baby.  Do not use any products that contain nicotine or tobacco, such as cigarettes and e-cigarettes. If you need help quitting, ask your health care provider. You may receive counseling support and other resources to help you quit.  Schedule a dentist appointment. At home, brush your teeth with a soft toothbrush and be gentle when you floss. Contact a health care provider if:  You have dizziness.  You have mild pelvic cramps, pelvic pressure, or nagging pain in the abdominal area.  You have persistent nausea, vomiting, or diarrhea.  You have a bad smelling vaginal discharge.  You have pain when you urinate.  You notice increased swelling in your face, hands, legs, or ankles.  You are exposed to fifth disease or chickenpox.  You are exposed to German measles (rubella) and have never had it. Get help right away if:  You have a fever.  You are leaking fluid from your vagina.  You have spotting or bleeding from your vagina.  You have severe abdominal cramping or pain.  You have rapid weight gain or loss.  You vomit blood or material that looks like coffee grounds.  You develop a severe headache.  You have shortness of breath.  You have any kind of trauma, such as from a fall or a car accident. Summary  The first trimester of pregnancy is from week 1 until the end of week 13 (months 1 through 3).  Your body goes through many changes during pregnancy. The changes vary from  woman to woman.  You will have routine prenatal visits. During those visits, your health care provider will examine you, discuss any test results you may have, and talk with you about how you are feeling. This information is not intended to replace advice given to you by your health care provider. Make sure you discuss any questions you have with your health care provider. Document Released: 03/27/2001 Document Revised: 03/14/2016 Document Reviewed: 03/14/2016 Elsevier Interactive Patient Education  2018 [employer]  Hypertension During Pregnancy Hypertension is also called high blood pressure. High blood pressure means that the force of your blood moving in your body is too strong. When you are pregnant, this condition should be watched carefully. It can cause problems for you and your baby. Follow these instructions at home: Eating and drinking  Drink enough fluid to keep your pee (urine) clear or pale yellow.  Eat healthy foods that are low in salt (sodium). ? Do not add salt to your food. ? Check labels on foods and drinks to see much salt is in them. Look on the label where you see "Sodium." Lifestyle  Do not use any products that contain nicotine or tobacco, such as cigarettes and e-cigarettes. If you need help quitting, ask your doctor.  Do not use alcohol.  Avoid caffeine.  Avoid stress. Rest and get plenty of sleep. General instructions  Take over-the-counter and prescription medicines only as told by your doctor.  While lying down, lie on your left side. This keeps pressure off your baby.  While sitting or lying down, raise (elevate) your feet. Try putting some pillows under your lower legs.  Exercise regularly. Ask your doctor what kinds of exercise are best for you.  Keep all prenatal and follow-up visits as told by your doctor. This is important. Contact a doctor if:  You have symptoms that your doctor told you to watch for, such as: ? Fever. ? Throwing up  (vomiting). ? Headache. Get help right away if:  You have very bad pain in your belly (abdomen).  You are throwing up, and this does not get better with treatment.  You suddenly get swelling in your hands, ankles, or face.  You gain 4 lb (1.8 kg) or more in 1 week.  You get bleeding from your vagina.  You have blood in your pee.  You do not feel your baby moving as much as normal.  You have a change in vision.  You have muscle twitching or sudden tightening (spasms).  You have trouble breathing.  Your lips or fingernails turn blue. This information is not intended to replace advice given to you by your health care provider. Make sure you discuss any questions you have with your health care provider. Document Released: 05/05/2010 Document Revised: 12/13/2015 Document Reviewed: 12/13/2015 Elsevier Interactive Patient Education  [employer]2018 [employer]

## 2017-10-14 LAB — CYTOLOGY - PAP
CHLAMYDIA, DNA PROBE: NEGATIVE
DIAGNOSIS: NEGATIVE
NEISSERIA GONORRHEA: NEGATIVE

## 2017-10-14 LAB — CULTURE, OB URINE

## 2017-10-14 LAB — URINE CULTURE, OB REFLEX

## 2017-10-18 LAB — PROTEIN / CREATININE RATIO, URINE
CREATININE, UR: 194.8 mg/dL
PROTEIN UR: 15.8 mg/dL
Protein/Creat Ratio: 81 mg/g creat (ref 0–200)

## 2017-10-18 LAB — COMPREHENSIVE METABOLIC PANEL
ALK PHOS: 44 IU/L (ref 39–117)
ALT: 64 IU/L — ABNORMAL HIGH (ref 0–32)
AST: 40 IU/L (ref 0–40)
Albumin/Globulin Ratio: 1.6 (ref 1.2–2.2)
Albumin: 4.1 g/dL (ref 3.5–5.5)
BILIRUBIN TOTAL: 0.6 mg/dL (ref 0.0–1.2)
BUN / CREAT RATIO: 15 (ref 9–23)
BUN: 9 mg/dL (ref 6–20)
CHLORIDE: 106 mmol/L (ref 96–106)
CO2: 21 mmol/L (ref 20–29)
CREATININE: 0.62 mg/dL (ref 0.57–1.00)
Calcium: 9.3 mg/dL (ref 8.7–10.2)
GFR calc Af Amer: 146 mL/min/{1.73_m2} (ref >59)
GFR calc non Af Amer: 127 mL/min/{1.73_m2} (ref >59)
GLOBULIN, TOTAL: 2.5 g/dL (ref 1.5–4.5)
GLUCOSE: 83 mg/dL (ref 65–99)
POTASSIUM: 4 mmol/L (ref 3.5–5.2)
SODIUM: 139 mmol/L (ref 134–144)
Total Protein: 6.6 g/dL (ref 6.0–8.5)

## 2017-10-18 LAB — SMN1 COPY NUMBER ANALYSIS (SMA CARRIER SCREENING)

## 2017-10-18 LAB — OBSTETRIC PANEL, INCLUDING HIV
Antibody Screen: NEGATIVE
BASOS ABS: 0 10*3/uL (ref 0.0–0.2)
BASOS: 0 %
EOS (ABSOLUTE): 0 10*3/uL (ref 0.0–0.4)
Eos: 0 %
HEMATOCRIT: 35.8 % (ref 34.0–46.6)
HEP B S AG: NEGATIVE
HIV SCREEN 4TH GENERATION: NONREACTIVE
Hemoglobin: 12.5 g/dL (ref 11.1–15.9)
IMMATURE GRANS (ABS): 0 10*3/uL (ref 0.0–0.1)
Immature Granulocytes: 0 %
Lymphocytes Absolute: 2.1 10*3/uL (ref 0.7–3.1)
Lymphs: 22 %
MCH: 30.3 pg (ref 26.6–33.0)
MCHC: 34.9 g/dL (ref 31.5–35.7)
MCV: 87 fL (ref 79–97)
MONOCYTES: 5 %
Monocytes Absolute: 0.5 10*3/uL (ref 0.1–0.9)
NEUTROS ABS: 6.9 10*3/uL (ref 1.4–7.0)
Neutrophils: 73 %
PLATELETS: 195 10*3/uL (ref 150–450)
RBC: 4.13 x10E6/uL (ref 3.77–5.28)
RDW: 13.5 % (ref 12.3–15.4)
RPR: NONREACTIVE
RUBELLA: 1.54 {index} (ref >0.99)
Rh Factor: POSITIVE
WBC: 9.6 10*3/uL (ref 3.4–10.8)

## 2017-10-18 LAB — HEMOGLOBINOPATHY EVALUATION
Ferritin: 90 ng/mL (ref 15–150)
HGB A2 QUANT: 2.5 % (ref 1.8–3.2)
HGB A: 97.5 % (ref 96.4–98.8)
HGB C: 0 %
HGB F QUANT: 0 % (ref 0.0–2.0)
HGB S: 0 %
HGB VARIANT: 0 %
Hgb Solubility: NEGATIVE

## 2017-10-18 LAB — CYSTIC FIBROSIS GENE TEST

## 2017-10-26 ENCOUNTER — Inpatient Hospital Stay (HOSPITAL_COMMUNITY)
Admission: AD | Admit: 2017-10-26 | Discharge: 2017-10-26 | Disposition: A | Discharge status: Home / Self Care | Payer: Medicaid Other | Source: Ambulatory Visit | Attending: Obstetrics & Gynecology | Admitting: Obstetrics & Gynecology

## 2017-10-26 ENCOUNTER — Encounter (HOSPITAL_COMMUNITY): Payer: Self-pay | Admitting: *Deleted

## 2017-10-26 DIAGNOSIS — O99611 Diseases of the digestive system complicating pregnancy, first trimester: Secondary | ICD-10-CM | POA: Insufficient documentation

## 2017-10-26 DIAGNOSIS — M549 Dorsalgia, unspecified: Secondary | ICD-10-CM | POA: Diagnosis not present

## 2017-10-26 DIAGNOSIS — Z803 Family history of malignant neoplasm of breast: Secondary | ICD-10-CM | POA: Insufficient documentation

## 2017-10-26 DIAGNOSIS — O26891 Other specified pregnancy related conditions, first trimester: Secondary | ICD-10-CM | POA: Insufficient documentation

## 2017-10-26 DIAGNOSIS — R109 Unspecified abdominal pain: Secondary | ICD-10-CM | POA: Insufficient documentation

## 2017-10-26 DIAGNOSIS — Z880 Allergy status to penicillin: Secondary | ICD-10-CM | POA: Insufficient documentation

## 2017-10-26 DIAGNOSIS — K5901 Slow transit constipation: Secondary | ICD-10-CM | POA: Diagnosis not present

## 2017-10-26 DIAGNOSIS — Z8249 Family history of ischemic heart disease and other diseases of the circulatory system: Secondary | ICD-10-CM | POA: Diagnosis not present

## 2017-10-26 DIAGNOSIS — O9989 Other specified diseases and conditions complicating pregnancy, childbirth and the puerperium: Secondary | ICD-10-CM | POA: Diagnosis not present

## 2017-10-26 DIAGNOSIS — K5900 Constipation, unspecified: Secondary | ICD-10-CM | POA: Insufficient documentation

## 2017-10-26 DIAGNOSIS — Z3A13 13 weeks gestation of pregnancy: Secondary | ICD-10-CM | POA: Insufficient documentation

## 2017-10-26 LAB — URINALYSIS, ROUTINE W REFLEX MICROSCOPIC
Bilirubin Urine: NEGATIVE
Glucose, UA: NEGATIVE mg/dL
Hgb urine dipstick: NEGATIVE
Ketones, ur: NEGATIVE mg/dL
NITRITE: NEGATIVE
PROTEIN: NEGATIVE mg/dL
Specific Gravity, Urine: 1.013 (ref 1.005–1.030)
pH: 7 (ref 5.0–8.0)

## 2017-10-26 NOTE — MAU Note (Addendum)
+  lower left abdominal pain Started last night Rating pain 8/10 Cramping intermittent  +lower back pain that radiates down legs Started Wednesday Intermittent Sharp Rating pain 9/10  Has not taken any medication.  Denies vaginal discharge or bleeding  FHR 158

## 2017-10-26 NOTE — Discharge Instructions (Signed)

## 2017-10-26 NOTE — MAU Provider Note (Signed)
History   1 at 13 weeks and 1 day in with complaints of left-sided abdominal pain that started on Wednesday.  Patient states she has not had a bowel movement since Thursday.  She denies any vaginal bleeding.  CSN: [id number]  Arrival date & time 10/26/17  1753   None     Chief Complaint  Patient presents with  . Abdominal Pain  . Back Pain    HPI  Past Medical History:  Diagnosis Date  . Asthma   . Chlamydia   . Genital herpes   . Hypertension    on meds    Past Surgical History:  Procedure Laterality Date  . NO PAST SURGERIES      Family History  Problem Relation Age of Onset  . Hypertension Mother   . Breast cancer Maternal Grandmother   . Hypertension Maternal Grandmother   . Cancer Maternal Grandmother   . Breast cancer Paternal Grandmother   . Cancer Paternal Grandmother   . Cancer Maternal Grandfather     Social History   Tobacco Use  . Smoking status: Never Smoker  . Smokeless tobacco: Never Used  Substance Use Topics  . Alcohol use: Not Currently    Comment: socially prior to preg  . Drug use: No    OB History    Gravida  1   Para  0   Term  0   Preterm  0   AB  0   Living  0     SAB  0   TAB  0   Ectopic  0   Multiple  0   Live Births              Review of Systems  Constitutional: Negative.   HENT: Negative.   Eyes: Negative.   Respiratory: Negative.   Cardiovascular: Negative.   Gastrointestinal: Positive for abdominal pain and constipation.  Endocrine: Negative.   Genitourinary: Negative.   Musculoskeletal: Negative.   Skin: Negative.   Allergic/Immunologic: Negative.   Neurological: Negative.   Hematological: Negative.   Psychiatric/Behavioral: Negative.     Allergies  Amoxicillin  Home Medications    BP 135/84 (BP Location: Right Arm)   Pulse 76   Temp 98 F (36.7 C) (Oral)   Resp 18   Wt 237 lb (107.5 kg)   LMP 07/14/2017 (Exact Date)   SpO2 100% Comment: ra  BMI 38.25 kg/m   Physical  Exam  Constitutional: She is oriented to person, place, and time. She appears well-developed and well-nourished.  HENT:  Head: Normocephalic.  Cardiovascular: Normal rate, regular rhythm, normal heart sounds and intact distal pulses.  Pulmonary/Chest: Effort normal and breath sounds normal.  Abdominal: Soft. Normal appearance and bowel sounds are normal.  Neurological: She is alert and oriented to person, place, and time.  Skin: Skin is warm and dry.  Psychiatric: She has a normal mood and affect. Her behavior is normal.    MAU Course  Procedures (including critical care time)  Labs Reviewed  URINALYSIS, ROUTINE W REFLEX MICROSCOPIC - Abnormal; Notable for the following components:      Result Value   Leukocytes, UA TRACE (*)    Bacteria, UA RARE (*)    All other components within normal limits   No results found.   1. Slow transit constipation       MDM  Vital signs are stable fetal heart rate is 158 strong and regular for Doppler.  Abdomen is soft and nontender.  Discussed high-fiber diet  in increasing her fluid intake.  Patient is to start a stool softener.  She is to follow-up as needed as needed.

## 2017-10-31 ENCOUNTER — Encounter: Payer: Self-pay | Admitting: Obstetrics and Gynecology

## 2017-10-31 ENCOUNTER — Encounter: Payer: Self-pay | Admitting: Family Medicine

## 2017-10-31 ENCOUNTER — Ambulatory Visit (INDEPENDENT_AMBULATORY_CARE_PROVIDER_SITE_OTHER): Payer: Medicaid Other | Admitting: Clinical

## 2017-10-31 ENCOUNTER — Ambulatory Visit (INDEPENDENT_AMBULATORY_CARE_PROVIDER_SITE_OTHER): Payer: Medicaid Other | Admitting: Obstetrics and Gynecology

## 2017-10-31 DIAGNOSIS — Z8619 Personal history of other infectious and parasitic diseases: Secondary | ICD-10-CM

## 2017-10-31 DIAGNOSIS — O0990 Supervision of high risk pregnancy, unspecified, unspecified trimester: Secondary | ICD-10-CM

## 2017-10-31 DIAGNOSIS — O10919 Unspecified pre-existing hypertension complicating pregnancy, unspecified trimester: Secondary | ICD-10-CM

## 2017-10-31 DIAGNOSIS — F4323 Adjustment disorder with mixed anxiety and depressed mood: Secondary | ICD-10-CM | POA: Diagnosis not present

## 2017-10-31 LAB — POCT URINALYSIS DIP (DEVICE)
GLUCOSE, UA: NEGATIVE mg/dL
Hgb urine dipstick: NEGATIVE
KETONES UR: NEGATIVE mg/dL
Leukocytes, UA: NEGATIVE
Nitrite: NEGATIVE
Protein, ur: 30 mg/dL — AB
SPECIFIC GRAVITY, URINE: 1.015 (ref 1.005–1.030)
UROBILINOGEN UA: 1 mg/dL (ref 0.0–1.0)
pH: 8.5 — ABNORMAL HIGH (ref 5.0–8.0)

## 2017-10-31 MED ORDER — DOCUSATE SODIUM 100 MG PO CAPS: 100.0000 mg | ORAL_CAPSULE | Freq: Two times a day (BID) | ORAL | 2 refills | Status: DC

## 2017-10-31 NOTE — Progress Notes (Signed)
   PRENATAL VISIT NOTE  Subjective:  Bailey Carney is a 27 y.o. G1P0000 at 57108w6d being seen today for ongoing prenatal care.  She is currently monitored for the following issues for this high-risk pregnancy and has Asthma; Hypertension; Chronic hypertension during pregnancy, antepartum; Supervision of high risk pregnancy, antepartum; and History of ELISA positive for HSV on their problem list.  Patient reports constipation and dysuria.  Contractions: Not present. Vag. Bleeding: None.  Movement: Absent. Denies leaking of fluid.   The following portions of the patient's history were reviewed and updated as appropriate: allergies, current medications, past family history, past medical history, past social history, past surgical history and problem list. Problem list updated.  Objective:   Vitals:   10/31/17 0922  BP: 128/88  Pulse: 79  Weight: 232 lb (105.2 kg)    Fetal Status: Fetal Heart Rate (bpm): 150   Movement: Absent     General:  Alert, oriented and cooperative. Patient is in no acute distress.  Skin: Skin is warm and dry. No rash noted.   Cardiovascular: Normal heart rate noted  Respiratory: Normal respiratory effort, no problems with respiration noted  Abdomen: Soft, gravid, appropriate for gestational age.  Pain/Pressure: Absent     Pelvic: Cervical exam deferred        Extremities: Normal range of motion.  Edema: None  Mental Status: Normal mood and affect. Normal behavior. Normal judgment and thought content.   Assessment and Plan:  Pregnancy: G1P0000 at 19108w6d  1. Supervision of high risk pregnancy, antepartum Advised patient to stay well hydrated and to consume high fiber foods/supplements. Rx colace provided Urine culture sent Panorama today Anatomy ultrasound scheduled - Genetic Screening - Culture, OB Urine  2. Chronic hypertension during pregnancy, antepartum Stable on labetalol  Continue ASA  3. History of ELISA positive for HSV Suppression at 36  weeks  Preterm labor symptoms and general obstetric precautions including but not limited to vaginal bleeding, contractions, leaking of fluid and fetal movement were reviewed in detail with the patient. Please refer to After Visit Summary for other counseling recommendations.  Return in about 1 month (around 11/28/2017) for ROB.  Future Appointments  Date Time Provider Department Center  12/06/2017  9:30 AM [facility] US 5 [facility]    Catalina AntiguaPeggy Amaira Safley, MD

## 2017-10-31 NOTE — Progress Notes (Signed)
Patient reports severe constipation recently- had ER visit over the weekend for this.  Patient reports vaginal irritation most notably with urination. Denies discharge.

## 2017-10-31 NOTE — BH Specialist Note (Signed)
Integrated Behavioral Health Initial Visit  MRN: [id number] Name: Bailey Carney Debose  Number of Integrated Behavioral Health Clinician visits:: 1/6 Session Start time: 9:56  Session End time: 10:23 Total time: 30 minutes  Type of Service: Integrated Behavioral Health- Individual/Family Interpretor:No. Interpretor Name and Language: n/a   Warm Hand Off Completed.       SUBJECTIVE: Bailey Carney Sleep is a 27 y.o. female accompanied by n/a Patient was referred by Catalina AntiguaPeggy Constant, MD for depression, anxiety. Patient reports the following symptoms/concerns: Pt states her primary concern today is feeling "a lot of anxiety" during her first pregnancy, along with feeling fatigue, attributed to first trimester sickness/nausea and hypertension; open to learning self-coping strategy to help with escalating anxiety.   Duration of problem: Current pregnancy; Severity of problem: severe  OBJECTIVE: Mood: Anxious and Affect: Appropriate Risk of harm to self or others: No plan to harm self or others  LIFE CONTEXT: Family and Social: - School/Work: Changing work schedule(1st/2nd shifts) Self-Care: Recognizing a greater need for self-care in pregnancy Life Changes: Current pregnancy and family stress  GOALS ADDRESSED: Patient will: 1. Reduce symptoms of: anxiety, depression and stress 2. Increase knowledge and/or ability of: self-management skills and stress reduction  3. Demonstrate ability to: Increase healthy adjustment to current life circumstances  INTERVENTIONS: Interventions utilized: Mindfulness or Management consultantelaxation Training and Psychoeducation and/or Health Education  Standardized Assessments completed: GAD-7 and PHQ 9  ASSESSMENT: Patient currently experiencing Adjustment disorder with mixed anxious and depressed mood.   Patient may benefit from psychoeducation and brief therapeutic interventions regarding coping with symptoms of anxiety and depression .  PLAN: 1. Follow up with behavioral  health clinician on : One month 2. Behavioral recommendations:  -CALM relaxation breathing exercise twice daily (first hour upon waking; at bedtime) -Read educational material regarding coping with symptoms of anxiety and depression  3. Referral(s): [facility] 4. "From scale of 1-10, how likely are you to follow plan?": 10  Rae LipsJamie C Cleophas Yoak, LCSW  Dep[facility] screen [facility] 10/31/2017 10/11/2017  Decreased Interest 2 2  Down, Depressed, Hopeless 2 1  PHQ - 2 Score 4 3  Altered sleeping 3 3  Tired, decreased energy 3 3  Change in appetite 3 3  Feeling bad or failure about yourself  1 1  Trouble concentrating 2 1  Moving slowly or fidgety/restless 0 0  Suicidal thoughts 0 0  PHQ-9 Score 16 14   GAD 7 : Generalized Anxiety Score 10/31/2017 10/11/2017  Nervous, Anxious, on Edge 2 2  Control/stop worrying 3 2  Worry too much - different things 3 2  Trouble relaxing 3 2  Restless 2 0  Easily annoyed or irritable 3 3  Afraid - awful might happen 3 1  Total GAD 7 Score 19 12

## 2017-11-02 LAB — CULTURE, OB URINE

## 2017-11-02 LAB — URINE CULTURE, OB REFLEX: ORGANISM ID, BACTERIA: NO GROWTH

## 2017-11-05 ENCOUNTER — Encounter: Payer: Medicaid Other | Admitting: Obstetrics and Gynecology

## 2017-11-06 ENCOUNTER — Other Ambulatory Visit (HOSPITAL_COMMUNITY)
Admission: RE | Admit: 2017-11-06 | Discharge: 2017-11-06 | Disposition: A | Discharge status: Home / Self Care | Payer: Medicaid Other | Source: Ambulatory Visit | Attending: Obstetrics and Gynecology | Admitting: Obstetrics and Gynecology

## 2017-11-06 ENCOUNTER — Ambulatory Visit (INDEPENDENT_AMBULATORY_CARE_PROVIDER_SITE_OTHER): Payer: Medicaid Other | Admitting: *Deleted

## 2017-11-06 DIAGNOSIS — N898 Other specified noninflammatory disorders of vagina: Secondary | ICD-10-CM

## 2017-11-06 DIAGNOSIS — O26892 Other specified pregnancy related conditions, second trimester: Secondary | ICD-10-CM | POA: Insufficient documentation

## 2017-11-06 DIAGNOSIS — Z3A13 13 weeks gestation of pregnancy: Secondary | ICD-10-CM | POA: Diagnosis not present

## 2017-11-06 NOTE — Progress Notes (Signed)
Pt reports vaginal itching, irritation and clear discharge which started on 7/14. She had used Terconazole cream x 4 days (left from previous episode of yeast) which caused the itching to stop but not the irritation or discharge. Pt also concerned that she may have UTI. We discussed that her urine culture was negative for infection on 7/18 and since she denies burning or pain with urination this is unlikely. Pt desires testing for GC, Chlamydia, Trich, yeast and BV. Self vaginal swab performed. She will be notified of results and any necessary treatment via MyChart.  Pt voiced understanding.

## 2017-11-06 NOTE — Progress Notes (Signed)
Chart reviewed for nurse visit. Agree with plan of care.   Judeth HornLawrence, Abdon Petrosky, NP 11/06/2017 5:17 PM

## 2017-11-07 ENCOUNTER — Other Ambulatory Visit: Payer: Self-pay | Admitting: Student

## 2017-11-07 ENCOUNTER — Encounter (INDEPENDENT_AMBULATORY_CARE_PROVIDER_SITE_OTHER): Payer: Self-pay

## 2017-11-07 DIAGNOSIS — B9689 Other specified bacterial agents as the cause of diseases classified elsewhere: Secondary | ICD-10-CM

## 2017-11-07 DIAGNOSIS — N760 Acute vaginitis: Principal | ICD-10-CM

## 2017-11-07 DIAGNOSIS — B3731 Acute candidiasis of vulva and vagina: Secondary | ICD-10-CM

## 2017-11-07 DIAGNOSIS — B373 Candidiasis of vulva and vagina: Secondary | ICD-10-CM

## 2017-11-07 LAB — CERVICOVAGINAL ANCILLARY ONLY
BACTERIAL VAGINITIS: POSITIVE — AB
CANDIDA VAGINITIS: POSITIVE — AB
Chlamydia: NEGATIVE
Neisseria Gonorrhea: NEGATIVE
Trichomonas: NEGATIVE

## 2017-11-07 MED ORDER — TERCONAZOLE 0.8 % VA CREA: 1.0000 | TOPICAL_CREAM | Freq: Every day | VAGINAL | 0 refills | Status: DC

## 2017-11-07 MED ORDER — METRONIDAZOLE 500 MG PO TABS: 500.0000 mg | ORAL_TABLET | Freq: Two times a day (BID) | ORAL | 0 refills | Status: DC

## 2017-11-11 ENCOUNTER — Telehealth: Payer: Self-pay | Admitting: *Deleted

## 2017-11-11 NOTE — Telephone Encounter (Signed)
Pt left VM regarding test results. She requests a call back.

## 2017-11-12 NOTE — Telephone Encounter (Signed)
Call patient to inform her that generic testing results have not been resulted at this time. We will call her once they have been resulted.

## 2017-11-13 ENCOUNTER — Encounter: Payer: Self-pay | Admitting: *Deleted

## 2017-11-14 ENCOUNTER — Telehealth: Payer: Self-pay | Admitting: *Deleted

## 2017-11-14 ENCOUNTER — Inpatient Hospital Stay (HOSPITAL_COMMUNITY)
Admission: AD | Admit: 2017-11-14 | Discharge: 2017-11-14 | Disposition: A | Discharge status: Home / Self Care | Payer: Medicaid Other | Source: Ambulatory Visit | Attending: Obstetrics & Gynecology | Admitting: Obstetrics & Gynecology

## 2017-11-14 ENCOUNTER — Encounter (HOSPITAL_COMMUNITY): Payer: Self-pay | Admitting: *Deleted

## 2017-11-14 DIAGNOSIS — Z880 Allergy status to penicillin: Secondary | ICD-10-CM | POA: Insufficient documentation

## 2017-11-14 DIAGNOSIS — R12 Heartburn: Secondary | ICD-10-CM | POA: Diagnosis not present

## 2017-11-14 DIAGNOSIS — O99612 Diseases of the digestive system complicating pregnancy, second trimester: Secondary | ICD-10-CM | POA: Diagnosis not present

## 2017-11-14 DIAGNOSIS — O26892 Other specified pregnancy related conditions, second trimester: Secondary | ICD-10-CM

## 2017-11-14 DIAGNOSIS — K219 Gastro-esophageal reflux disease without esophagitis: Secondary | ICD-10-CM | POA: Insufficient documentation

## 2017-11-14 DIAGNOSIS — Z3A15 15 weeks gestation of pregnancy: Secondary | ICD-10-CM | POA: Insufficient documentation

## 2017-11-14 DIAGNOSIS — Z7982 Long term (current) use of aspirin: Secondary | ICD-10-CM | POA: Diagnosis not present

## 2017-11-14 DIAGNOSIS — R079 Chest pain, unspecified: Secondary | ICD-10-CM | POA: Diagnosis present

## 2017-11-14 MED ORDER — ALBUTEROL SULFATE HFA 108 (90 BASE) MCG/ACT IN AERS
1.0000 | INHALATION_SPRAY | Freq: Four times a day (QID) | RESPIRATORY_TRACT | 0 refills | Status: AC | PRN
Start: 2017-11-14 — End: ?

## 2017-11-14 MED ORDER — GI COCKTAIL ~~LOC~~
30.0000 mL | Freq: Once | ORAL | Status: AC
  Administered 2017-11-14: 30 mL via ORAL
  Filled 2017-11-14: qty 30

## 2017-11-14 NOTE — Telephone Encounter (Signed)
Pt left message requesting test results. We also received a separate call/message from "Bolesaleb" @ [facility] stating that pt had called them and said that our office is not receiving the reports from [facility]. He stated that he would re-send her report

## 2017-11-14 NOTE — Telephone Encounter (Signed)
Called pt and informed pt that her results are normal and they should able to be seen in MyChart however she would need to use a desktop or labtop to see them.  I explained to the pt that how the result is placed she would not be able to see the result from her smart phone.  Pt stated understanding with no further questions.

## 2017-11-14 NOTE — MAU Note (Signed)
Substernal pain and tightness that started around 1700.  No history of any cardiac issues.  States she does having CHTN and is on medication-last took this AM.  Also had some SOB but none now.  The pain started when she was sitting at work.  Also having headaches and blurry vision-was evaluated here for headaches a few days ago.

## 2017-11-14 NOTE — MAU Provider Note (Addendum)
History     CSN: [id number]  Arrival date and time: 11/14/17 1924  Seen by provider at 8:45 pm  Chief Complaint  Patient presents with  . Chest Pain   HPI Bailey Carney 27 y.o. 2548w6d Comes to MAU as she started having pain in the middle of her chest today while at work.  Last vomiting was one week ago.  Does not know what heartburn feels like.  Was having an associated shortness of breath and the duration of the shortness of breath was short lived.  Pain in chest continues in MAU at 6/10 and earlier the pain was 8-9/10 Client has a history of hypertension and asthma.  Currently is taking labetalol and low dose aspirin.  OB History    Gravida  1   Para  0   Term  0   Preterm  0   AB  0   Living  0     SAB  0   TAB  0   Ectopic  0   Multiple  0   Live Births              Past Medical History:  Diagnosis Date  . Asthma   . Chlamydia   . Genital herpes   . Hypertension    on meds    Past Surgical History:  Procedure Laterality Date  . NO PAST SURGERIES      Family History  Problem Relation Age of Onset  . Hypertension Mother   . Breast cancer Maternal Grandmother   . Hypertension Maternal Grandmother   . Cancer Maternal Grandmother   . Breast cancer Paternal Grandmother   . Cancer Paternal Grandmother   . Cancer Maternal Grandfather     Social History   Tobacco Use  . Smoking status: Never Smoker  . Smokeless tobacco: Never Used  Substance Use Topics  . Alcohol use: Not Currently    Comment: socially prior to preg  . Drug use: No    Allergies:  Allergies  Allergen Reactions  . Amoxicillin Rash and Other (See Comments)    Has patient had a PCN reaction causing immediate rash, facial/tongue/throat swelling, SOB or lightheadedness with hypotension: No Has patient had a PCN reaction causing severe rash involving mucus membranes or skin necrosis: No Has patient had a PCN reaction that required hospitalization No Has patient had a PCN  reaction occurring within the last 10 years: No If all of the above answers are "NO", then may proceed with Cephalosporin use.     Medications Prior to Admission  Medication Sig Dispense Refill Last Dose  . aspirin EC 81 MG tablet Take 1 tablet (81 mg total) by mouth daily. Take after 12 weeks for prevention of preeclampsia later in pregnancy 300 tablet 2 11/14/2017 at Unknown time  . labetalol (NORMODYNE) 200 MG tablet Take 1 tablet (200 mg total) by mouth 2 (two) times daily. 60 tablet 6 11/14/2017 at Unknown time  . metoCLOPramide (REGLAN) 10 MG tablet Take 1 tablet (10 mg total) by mouth every 6 (six) hours as needed for nausea. May use for headache 20 tablet 0 Past Month at Unknown time  . metroNIDAZOLE (FLAGYL) 500 MG tablet Take 1 tablet (500 mg total) by mouth 2 (two) times daily. 14 tablet 0 11/14/2017 at Unknown time  . terconazole (TERAZOL 3) 0.8 % vaginal cream Place 1 applicator vaginally at bedtime. 20 g 0 Past Month at Unknown time  . albuterol (PROVENTIL HFA;VENTOLIN HFA) 108 (90 Base) MCG/ACT  inhaler Inhale 2 puffs into the lungs every 4 (four) hours as needed for wheezing or shortness of breath. 1 Inhaler 0 Taking  . docusate sodium (COLACE) 100 MG capsule Take 1 capsule (100 mg total) by mouth 2 (two) times daily. 30 capsule 2 More than a month at Unknown time    Review of Systems  Constitutional: Negative for fever.  Respiratory: Positive for chest tightness and shortness of breath. Negative for wheezing.   Gastrointestinal: Negative for constipation, diarrhea, nausea and vomiting.  Genitourinary: Negative for dysuria, vaginal bleeding and vaginal discharge.   Physical Exam   Blood pressure 126/85, pulse 93, temperature 98.4 F (36.9 C), temperature source Oral, resp. rate 19, height 5\' 7"  (1.702 m), weight 235 lb 12 oz (106.9 kg), last menstrual period 07/14/2017, SpO2 100 %.  Physical Exam  Nursing note and vitals reviewed. Constitutional: She is oriented to person,  place, and time. She appears well-developed and well-nourished.  HENT:  Head: Normocephalic.  Eyes: EOM are normal.  Neck: Neck supple.  GI: Soft. There is no tenderness. There is no rebound and no guarding.  FHT 153 by doppler  Musculoskeletal: Normal range of motion.  Neurological: She is alert and oriented to person, place, and time.  Skin: Skin is warm and dry.  Psychiatric: She has a normal mood and affect.    MAU Course  Procedures No results found for this or any previous visit (from the past 24 hour(s)).  MDM EKG is normal. Pain was 100% resolved with GI Cocktail.  Assessment and Plan  Heartburn in pregnancy  Plan Avoid high fat foods. Take OTC tums Keep your appointments in the office.  Terri L Burleson 11/14/2017, 8:43 PM

## 2017-11-14 NOTE — MAU Note (Signed)
Urine in lab 

## 2017-11-15 ENCOUNTER — Telehealth: Payer: Self-pay | Admitting: *Deleted

## 2017-11-15 NOTE — Telephone Encounter (Signed)
Patient states she has spoken to someone yesterday afternoon.  States she was told she should be able to look at her test results on My Chart.  She says she was told not to use her cell phone to use a laptop or desktop computer.  I went through the process with the patient.  She states she could not see the results even with my detailed instructions.  I requested the patient bring her phone to her next appointment and ask for me and I will go through it with her in person.  Patient states she will.  Requesting Panorama results - gender of baby.  I explained to patient we do not give these results over the phone for various reasons.  Patient states she has already been told that.  I explained I would make an exception this time for her.  Gender given.  Patient seems pleased.

## 2017-11-15 NOTE — Discharge Instructions (Signed)

## 2017-11-19 ENCOUNTER — Encounter: Payer: Self-pay | Admitting: *Deleted

## 2017-11-25 ENCOUNTER — Other Ambulatory Visit: Payer: Self-pay

## 2017-11-25 ENCOUNTER — Emergency Department: Payer: Medicaid Other

## 2017-11-25 ENCOUNTER — Observation Stay
Admission: EM | Admit: 2017-11-25 | Discharge: 2017-11-27 | Disposition: A | Discharge status: Home / Self Care | Payer: Medicaid Other | Attending: Surgery | Admitting: Surgery

## 2017-11-25 DIAGNOSIS — O162 Unspecified maternal hypertension, second trimester: Secondary | ICD-10-CM | POA: Insufficient documentation

## 2017-11-25 DIAGNOSIS — J45909 Unspecified asthma, uncomplicated: Secondary | ICD-10-CM | POA: Diagnosis not present

## 2017-11-25 DIAGNOSIS — Z3A17 17 weeks gestation of pregnancy: Secondary | ICD-10-CM | POA: Diagnosis not present

## 2017-11-25 DIAGNOSIS — R1013 Epigastric pain: Secondary | ICD-10-CM

## 2017-11-25 DIAGNOSIS — O99612 Diseases of the digestive system complicating pregnancy, second trimester: Principal | ICD-10-CM | POA: Insufficient documentation

## 2017-11-25 DIAGNOSIS — Z7982 Long term (current) use of aspirin: Secondary | ICD-10-CM | POA: Insufficient documentation

## 2017-11-25 DIAGNOSIS — O99512 Diseases of the respiratory system complicating pregnancy, second trimester: Secondary | ICD-10-CM | POA: Diagnosis not present

## 2017-11-25 DIAGNOSIS — K810 Acute cholecystitis: Secondary | ICD-10-CM | POA: Diagnosis not present

## 2017-11-25 DIAGNOSIS — Z79899 Other long term (current) drug therapy: Secondary | ICD-10-CM | POA: Diagnosis not present

## 2017-11-25 DIAGNOSIS — R109 Unspecified abdominal pain: Secondary | ICD-10-CM | POA: Diagnosis present

## 2017-11-25 LAB — COMPREHENSIVE METABOLIC PANEL
ALT: 56 U/L — ABNORMAL HIGH (ref 0–44)
AST: 29 U/L (ref 15–41)
Albumin: 3.3 g/dL — ABNORMAL LOW (ref 3.5–5.0)
Alkaline Phosphatase: 39 U/L (ref 38–126)
Anion gap: 6 (ref 5–15)
BUN: 8 mg/dL (ref 6–20)
CHLORIDE: 107 mmol/L (ref 98–111)
CO2: 24 mmol/L (ref 22–32)
Calcium: 8.7 mg/dL — ABNORMAL LOW (ref 8.9–10.3)
Creatinine, Ser: 0.59 mg/dL (ref 0.44–1.00)
Glucose, Bld: 90 mg/dL (ref 70–99)
POTASSIUM: 3.8 mmol/L (ref 3.5–5.1)
SODIUM: 137 mmol/L (ref 135–145)
Total Bilirubin: 0.5 mg/dL (ref 0.3–1.2)
Total Protein: 6.4 g/dL — ABNORMAL LOW (ref 6.5–8.1)

## 2017-11-25 LAB — CBC WITH DIFFERENTIAL/PLATELET
BASOS PCT: 0 %
Basophils Absolute: 0 10*3/uL (ref 0–0.1)
EOS ABS: 0 10*3/uL (ref 0–0.7)
EOS PCT: 0 %
HCT: 33.6 % — ABNORMAL LOW (ref 35.0–47.0)
Hemoglobin: 11.8 g/dL — ABNORMAL LOW (ref 12.0–16.0)
LYMPHS PCT: 19 %
Lymphs Abs: 2 10*3/uL (ref 1.0–3.6)
MCH: 31.3 pg (ref 26.0–34.0)
MCHC: 35.3 g/dL (ref 32.0–36.0)
MCV: 88.8 fL (ref 80.0–100.0)
MONO ABS: 0.7 10*3/uL (ref 0.2–0.9)
Monocytes Relative: 7 %
Neutro Abs: 7.8 10*3/uL — ABNORMAL HIGH (ref 1.4–6.5)
Neutrophils Relative %: 74 %
PLATELETS: 161 10*3/uL (ref 150–440)
RBC: 3.78 MIL/uL — ABNORMAL LOW (ref 3.80–5.20)
RDW: 13.9 % (ref 11.5–14.5)
WBC: 10.6 10*3/uL (ref 3.6–11.0)

## 2017-11-25 LAB — URINALYSIS, COMPLETE (UACMP) WITH MICROSCOPIC
Bilirubin Urine: NEGATIVE
GLUCOSE, UA: NEGATIVE mg/dL
HGB URINE DIPSTICK: NEGATIVE
KETONES UR: NEGATIVE mg/dL
Nitrite: NEGATIVE
PROTEIN: NEGATIVE mg/dL
Specific Gravity, Urine: 1.015 (ref 1.005–1.030)
pH: 7 (ref 5.0–8.0)

## 2017-11-25 LAB — LIPASE, BLOOD: LIPASE: 22 U/L (ref 11–51)

## 2017-11-25 MED ORDER — SODIUM CHLORIDE 0.9 % IV SOLN
INTRAVENOUS | Status: DC
  Administered 2017-11-25 – 2017-11-26 (×2): via INTRAVENOUS

## 2017-11-25 MED ORDER — DOCUSATE SODIUM 100 MG PO CAPS: 100.0000 mg | ORAL_CAPSULE | Freq: Two times a day (BID) | ORAL | Status: DC | PRN

## 2017-11-25 MED ORDER — METOCLOPRAMIDE HCL 5 MG/ML IJ SOLN
10.0000 mg | Freq: Once | INTRAMUSCULAR | Status: AC
  Administered 2017-11-25: 10 mg via INTRAVENOUS
  Filled 2017-11-25: qty 2

## 2017-11-25 MED ORDER — SODIUM CHLORIDE 0.9 % IV BOLUS
1000.0000 mL | Freq: Once | INTRAVENOUS | Status: AC
  Administered 2017-11-25: 1000 mL via INTRAVENOUS

## 2017-11-25 MED ORDER — LACTATED RINGERS IV SOLN
INTRAVENOUS | Status: DC
  Administered 2017-11-25: 16:00:00 via INTRAVENOUS

## 2017-11-25 MED ORDER — HYDROCODONE-ACETAMINOPHEN 5-325 MG PO TABS
1.0000 | ORAL_TABLET | ORAL | Status: DC | PRN
Start: 2017-11-25 — End: 2017-11-27

## 2017-11-25 MED ORDER — TRAMADOL HCL 50 MG PO TABS
50.0000 mg | ORAL_TABLET | Freq: Four times a day (QID) | ORAL | Status: DC | PRN
  Administered 2017-11-26: 50 mg via ORAL
  Filled 2017-11-25: qty 1

## 2017-11-25 MED ORDER — PIPERACILLIN-TAZOBACTAM 3.375 G IVPB 30 MIN
3.3750 g | Freq: Once | INTRAVENOUS | Status: AC
  Administered 2017-11-25: 3.375 g via INTRAVENOUS
  Filled 2017-11-25: qty 50

## 2017-11-25 MED ORDER — ALBUTEROL SULFATE (2.5 MG/3ML) 0.083% IN NEBU
2.5000 mg | INHALATION_SOLUTION | Freq: Four times a day (QID) | RESPIRATORY_TRACT | Status: DC | PRN
Start: 2017-11-25 — End: 2017-11-27

## 2017-11-25 MED ORDER — PRENATAL PLUS 27-1 MG PO TABS
1.0000 | ORAL_TABLET | Freq: Every day | ORAL | Status: DC
  Filled 2017-11-25 (×4): qty 1

## 2017-11-25 MED ORDER — MORPHINE SULFATE (PF) 2 MG/ML IV SOLN
2.0000 mg | INTRAVENOUS | Status: DC | PRN
  Filled 2017-11-25: qty 1

## 2017-11-25 MED ORDER — METOCLOPRAMIDE HCL 10 MG PO TABS: 10.0000 mg | ORAL_TABLET | Freq: Four times a day (QID) | ORAL | Status: DC | PRN

## 2017-11-25 MED ORDER — FENTANYL CITRATE (PF) 100 MCG/2ML IJ SOLN
50.0000 ug | INTRAMUSCULAR | Status: DC | PRN
  Administered 2017-11-25: 50 ug via INTRAVENOUS
  Filled 2017-11-25: qty 2

## 2017-11-25 MED ORDER — SODIUM CHLORIDE 0.9 % IV SOLN
2.0000 g | Freq: Every day | INTRAVENOUS | Status: DC
  Administered 2017-11-25: 2 g via INTRAVENOUS
  Filled 2017-11-25: qty 2

## 2017-11-25 MED ORDER — ONDANSETRON HCL 4 MG/2ML IJ SOLN: 4.0000 mg | Freq: Four times a day (QID) | INTRAMUSCULAR | Status: DC | PRN

## 2017-11-25 MED ORDER — LABETALOL HCL 200 MG PO TABS
200.0000 mg | ORAL_TABLET | Freq: Two times a day (BID) | ORAL | Status: DC
  Administered 2017-11-25 – 2017-11-27 (×4): 200 mg via ORAL
  Filled 2017-11-25 (×6): qty 1

## 2017-11-25 NOTE — ED Triage Notes (Signed)
To ER via [facility] from work c/o epigastric cramping that started last night, stopped and then returned today. Pt [redacted] weeks pregnant, denies vaginal bleeding or discharge.

## 2017-11-25 NOTE — ED Notes (Signed)
[facility]  [facility], called for transfer 1240

## 2017-11-25 NOTE — H&P (Addendum)
Subjective:   CC: RUQ pain  HPI:  Bailey Carney is a 27 y.o. female who is consulted by Bailey Carney for evaluation of above cc.  Symptoms were first noted Yesterday evening initially starting as epigastric pain described as sharp nonradiating.  This resolved on its own after a few hours however this morning patient experienced a new onset similar pain but this time more focal to the right upper quadrant pain along with nausea and vomiting.  Patient came to the ED and work-up revealed a normal white count but ultrasound concerning for cholecystitis therefore general surgery consulted.  This is her first episode of having such pain.  She is also currently [redacted] weeks pregnant no issues with pregnancy so far  Past Medical History:  has a past medical history of Asthma, Chlamydia, Genital herpes, and Hypertension.  Past Surgical History:  has a past surgical history that includes No past surgeries.  Family History: family history includes Breast cancer in her maternal grandmother and paternal grandmother; Cancer in her maternal grandfather, maternal grandmother, and paternal grandmother; Hypertension in her maternal grandmother and mother.  Social History:  reports that she has never smoked. She has never used smokeless tobacco. She reports that she drank alcohol. She reports that she does not use drugs.  Current Medications:  Medications Prior to Admission  Medication Sig Dispense Refill  . albuterol (PROVENTIL HFA;VENTOLIN HFA) 108 (90 Base) MCG/ACT inhaler Inhale 1-2 puffs into the lungs every 6 (six) hours as needed for wheezing or shortness of breath. 1 Inhaler 0  . aspirin EC 81 MG tablet Take 1 tablet (81 mg total) by mouth daily. Take after 12 weeks for prevention of preeclampsia later in pregnancy 300 tablet 2  . docusate sodium (COLACE) 100 MG capsule Take 1 capsule (100 mg total) by mouth 2 (two) times daily. (Patient taking differently: Take 100 mg by mouth 2 (two) times daily as  needed for mild constipation. ) 30 capsule 2  . labetalol (NORMODYNE) 200 MG tablet Take 1 tablet (200 mg total) by mouth 2 (two) times daily. 60 tablet 6  . metoCLOPramide (REGLAN) 10 MG tablet Take 1 tablet (10 mg total) by mouth every 6 (six) hours as needed for nausea. May use for headache 20 tablet 0  . Prenatal Vit-Fe Fumarate-FA (PRENATAL PO) Take 1 tablet by mouth at bedtime.      Allergies:  Allergies as of 11/25/2017 - Review Complete 11/25/2017  Allergen Reaction Noted  . Amoxicillin Rash and Other (See Comments) 08/03/2013    ROS:  A 15 point review of systems was performed and pertinent positives and negatives noted in HPI    Objective:     BP 128/83   Pulse 70   Temp 98.1 F (36.7 C) (Oral)   Resp 18   Ht 5\' 7"  (1.702 m)   Wt 106.6 kg   LMP 07/14/2017 (Exact Date)   SpO2 100%   BMI 36.81 kg/m    Constitutional :  alert, cooperative, appears stated age and no distress  Lymphatics/Throat:  no asymmetry, masses, or scars  Respiratory:  clear to auscultation bilaterally  Cardiovascular:  regular rate and rhythm, S1, S2 normal, no murmur, click, rub or gallop  Gastrointestinal: pregnant, focal tenderness in RUQ, no rebound.   Musculoskeletal: Steady gait and movement  Skin: Cool and moist  Psychiatric: Normal affect, non-agitated, not confused       LABS:  CMP Latest Ref Rng & Units 11/25/2017 10/11/2017 09/26/2017  Glucose 70 - 99  mg/dL 90 83 77  BUN 6 - 20 mg/dL 8 9 11   Creatinine 0.44 - 1.00 mg/dL 1.610.59 0.960.62 0.450.57  Sodium 135 - 145 mmol/L 137 139 134(L)  Potassium 3.5 - 5.1 mmol/L 3.8 4.0 4.4  Chloride 98 - 111 mmol/L 107 106 104  CO2 22 - 32 mmol/L 24 21 23   Calcium 8.9 - 10.3 mg/dL 4.0(J8.7(L) 9.3 9.3  Total Protein 6.5 - 8.1 g/dL 6.4(L) 6.6 7.2  Total Bilirubin 0.3 - 1.2 mg/dL 0.5 0.6 0.6  Alkaline Phos 38 - 126 U/L 39 44 35(L)  AST 15 - 41 U/L 29 40 26  ALT 0 - 44 U/L 56(H) 64(H) 25   CBC Latest Ref Rng & Units 11/25/2017 10/11/2017 09/26/2017  WBC 3.6 -  11.0 K/uL 10.6 9.6 12.2(H)  Hemoglobin 12.0 - 16.0 g/dL 11.8(L) 12.5 12.4  Hematocrit 35.0 - 47.0 % 33.6(L) 35.8 35.6(L)  Platelets 150 - 440 K/uL 161 195 196     RADS: CLINICAL DATA:  Acute epigastric pain since November 24, 2017.  EXAM: ULTRASOUND ABDOMEN LIMITED RIGHT UPPER QUADRANT  COMPARISON:  None.  FINDINGS: Gallbladder:  The gallbladder is not fully distended. There is probable gallbladder wall thickening. Pericholecystic fluid and sludge/gallstones are noted. The ultrasonographer reports positive sonographic Murphy sign.  Common bile duct:  Diameter: 3.1 mm  Liver:  No focal lesion identified. Within normal limits in parenchymal echogenicity. Portal vein is patent on color Doppler imaging with normal direction of blood flow towards the liver.  IMPRESSION: Findings suspicious for acute cholecystitis.   Electronically Signed   By: Sherian ReinWei-Chen  Lin M.D.   On: 11/25/2017 11:03 Assessment:      RUQ pain  Plan:     Briefly discussed surgical management including proceeding with lap chole while pregnant during her second trimester.  Also discussed alternative including antibiotic therapy.  Due to the possible risks associated with proceeding with surgery under general anesthesia patient opted to try a round of antibiotics to get over this initial episode. With normal wbc, and   [place] we initially attempted [facility] where she will be closer to family however no accepting physician was able to be found by the emergency department therefore she will be admitted under me for IV antibiotic therapy.

## 2017-11-25 NOTE — ED Notes (Signed)
Report to Beth, RN

## 2017-11-25 NOTE — ED Provider Notes (Signed)
[facility] Emergency Department Provider Note    First MD Initiated Contact with Patient 11/25/17 [phone]     (approximate)  I have reviewed the triage vital signs and the nursing notes.   HISTORY  Chief Complaint Abdominal Pain    HPI Bailey Carney is a 27 y.o. female who is [redacted] weeks pregnant presents the ER with less than 24 hours of progressively worsening epigastric and right upper quadrant abdominal pain associated with nausea nonbilious nonbloody vomiting.  No diarrhea.  No dysuria.  No vaginal discharge or bleeding.  Denies any measured fevers.  States the pain is mild to moderate nonradiating.    Past Medical History:  Diagnosis Date  . Asthma   . Chlamydia   . Genital herpes   . Hypertension    on meds   Family History  Problem Relation Age of Onset  . Hypertension Mother   . Breast cancer Maternal Grandmother   . Hypertension Maternal Grandmother   . Cancer Maternal Grandmother   . Breast cancer Paternal Grandmother   . Cancer Paternal Grandmother   . Cancer Maternal Grandfather    Past Surgical History:  Procedure Laterality Date  . NO PAST SURGERIES     Patient Active Problem List   Diagnosis Date Noted  . Acute cholecystitis 11/25/2017  . History of ELISA positive for HSV 10/11/2017  . Chronic hypertension during pregnancy, antepartum 09/06/2017  . Supervision of high risk pregnancy, antepartum 09/06/2017  . Asthma   . Hypertension       Prior to Admission medications   Medication Sig Start Date End Date Taking? Authorizing Provider  albuterol (PROVENTIL HFA;VENTOLIN HFA) 108 (90 Base) MCG/ACT inhaler Inhale 1-2 puffs into the lungs every 6 (six) hours as needed for wheezing or shortness of breath. 11/14/17  Yes Burleson, Brand Maleserri L, NP  aspirin EC 81 MG tablet Take 1 tablet (81 mg total) by mouth daily. Take after 12 weeks for prevention of preeclampsia later in pregnancy 10/11/17  Yes Hermina StaggersErvin, Michael L, MD  docusate sodium  (COLACE) 100 MG capsule Take 1 capsule (100 mg total) by mouth 2 (two) times daily. Patient taking differently: Take 100 mg by mouth 2 (two) times daily as needed for mild constipation.  10/31/17  Yes Constant, Peggy, MD  labetalol (NORMODYNE) 200 MG tablet Take 1 tablet (200 mg total) by mouth 2 (two) times daily. 10/11/17  Yes Hermina StaggersErvin, Michael L, MD  metoCLOPramide (REGLAN) 10 MG tablet Take 1 tablet (10 mg total) by mouth every 6 (six) hours as needed for nausea. May use for headache 09/26/17  Yes Donette LarryBhambri, Melanie, CNM  Prenatal Vit-Fe Fumarate-FA (PRENATAL PO) Take 1 tablet by mouth at bedtime.   Yes [provider]    Allergies Amoxicillin    Social History Social History   Tobacco Use  . Smoking status: Never Smoker  . Smokeless tobacco: Never Used  Substance Use Topics  . Alcohol use: Not Currently    Comment: socially prior to preg  . Drug use: No    Review of Systems Patient denies headaches, rhinorrhea, blurry vision, numbness, shortness of breath, chest pain, edema, cough, abdominal pain, nausea, vomiting, diarrhea, dysuria, fevers, rashes or hallucinations unless otherwise stated above in HPI. ____________________________________________   PHYSICAL EXAM:  VITAL SIGNS: Vitals:   11/25/17 1111 11/25/17 1400  BP: 104/60 101/86  Pulse: 96 69  Resp: 14   Temp:    SpO2: 98% 100%    Constitutional: Alert and oriented.  Eyes:  Conjunctivae are normal.  Head: Atraumatic. Nose: No congestion/rhinnorhea. Mouth/Throat: Mucous membranes are moist.   Neck: No stridor. Painless ROM.  Cardiovascular: Normal rate, regular rhythm. Grossly normal heart sounds.  Good peripheral circulation. Respiratory: Normal respiratory effort.  No retractions. Lungs CTAB. Gastrointestinal: Soft and ruq ttp. No distention. No abdominal bruits. No CVA tenderness. Genitourinary:  Musculoskeletal: No lower extremity tenderness nor edema.  No joint effusions. Neurologic:  Normal speech  and language. No gross focal neurologic deficits are appreciated. No facial droop Skin:  Skin is warm, dry and intact. No rash noted. Psychiatric: Mood and affect are normal. Speech and behavior are normal.  ____________________________________________   LABS (all labs ordered are listed, but only abnormal results are displayed)  Results for orders placed or performed during the hospital encounter of 11/25/17 (from the past 24 hour(s))  CBC with Differential/Platelet     Status: Abnormal   Collection Time: 11/25/17 10:09 AM  Result Value Ref Range   WBC 10.6 3.6 - 11.0 K/uL   RBC 3.78 (L) 3.80 - 5.20 MIL/uL   Hemoglobin 11.8 (L) 12.0 - 16.0 g/dL   HCT 16.1 (L) 09.6 - 04.5 %   MCV 88.8 80.0 - 100.0 fL   MCH 31.3 26.0 - 34.0 pg   MCHC 35.3 32.0 - 36.0 g/dL   RDW 40.9 81.1 - 91.4 %   Platelets 161 150 - 440 K/uL   Neutrophils Relative % 74 %   Neutro Abs 7.8 (H) 1.4 - 6.5 K/uL   Lymphocytes Relative 19 %   Lymphs Abs 2.0 1.0 - 3.6 K/uL   Monocytes Relative 7 %   Monocytes Absolute 0.7 0.2 - 0.9 K/uL   Eosinophils Relative 0 %   Eosinophils Absolute 0.0 0 - 0.7 K/uL   Basophils Relative 0 %   Basophils Absolute 0.0 0 - 0.1 K/uL  Comprehensive metabolic panel     Status: Abnormal   Collection Time: 11/25/17 10:09 AM  Result Value Ref Range   Sodium 137 135 - 145 mmol/L   Potassium 3.8 3.5 - 5.1 mmol/L   Chloride 107 98 - 111 mmol/L   CO2 24 22 - 32 mmol/L   Glucose, Bld 90 70 - 99 mg/dL   BUN 8 6 - 20 mg/dL   Creatinine, Ser 7.82 0.44 - 1.00 mg/dL   Calcium 8.7 (L) 8.9 - 10.3 mg/dL   Total Protein 6.4 (L) 6.5 - 8.1 g/dL   Albumin 3.3 (L) 3.5 - 5.0 g/dL   AST 29 15 - 41 U/L   ALT 56 (H) 0 - 44 U/L   Alkaline Phosphatase 39 38 - 126 U/L   Total Bilirubin 0.5 0.3 - 1.2 mg/dL   GFR calc non Af Amer >60 >60 mL/min   GFR calc Af Amer >60 >60 mL/min   Anion gap 6 5 - 15  Lipase, blood     Status: None   Collection Time: 11/25/17 10:09 AM  Result Value Ref Range   Lipase 22  11 - 51 U/L  Urinalysis, Complete w Microscopic     Status: Abnormal   Collection Time: 11/25/17 10:09 AM  Result Value Ref Range   Color, Urine YELLOW (A) YELLOW   APPearance CLEAR (A) CLEAR   Specific Gravity, Urine 1.015 1.005 - 1.030   pH 7.0 5.0 - 8.0   Glucose, UA NEGATIVE NEGATIVE mg/dL   Hgb urine dipstick NEGATIVE NEGATIVE   Bilirubin Urine NEGATIVE NEGATIVE   Ketones, ur NEGATIVE NEGATIVE mg/dL   Protein, ur  NEGATIVE NEGATIVE mg/dL   Nitrite NEGATIVE NEGATIVE   Leukocytes, UA TRACE (A) NEGATIVE   WBC, UA 0-5 0 - 5 WBC/hpf   Bacteria, UA RARE (A) NONE SEEN   Squamous Epithelial / LPF 6-10 0 - 5   Mucus PRESENT    ____________________________________________ ____________________________________________  RADIOLOGY  I personally reviewed all radiographic images ordered to evaluate for the above acute complaints and reviewed radiology reports and findings.  These findings were personally discussed with the patient.  Please see medical record for radiology report.  ____________________________________________   PROCEDURES  Procedure(s) performed:  Procedures    Critical Care performed: no ____________________________________________   INITIAL IMPRESSION / ASSESSMENT AND PLAN / ED COURSE  Pertinent labs & imaging results that were available during my care of the patient were reviewed by me and considered in my medical decision making (see chart for details).   DDX: Cholelithiasis, cholecystitis, hepatitis, enteritis, UTI unlikely appendicitis  Bailey Carney is a 27 y.o. who presents to the ED with symptoms as described above.Patient is AFVSS in ED. Exam as above. Given current presentation have considered the above differential.  Right upper quadrant will be ordered to evaluate for cholecystitis given her pain.  The patient will be placed on continuous pulse oximetry and telemetry for monitoring.  Laboratory evaluation will be sent to evaluate for the above  complaints.     Clinical Course as of Nov 26 1510  Mon Nov 25, 2017  1120 Upper quadrant is concerning for acute cholecystitis.  Will consult general surgery.   [PR]  1157 Discussed case with Dr. Tonna BoehringerSakai of general surgery who kindly agrees to evaluate patient at bedside.  Will continue with IV antibiotics and IV pain medication.  Patient updated as to plan.   [PR]  1233 She has been evaluated by Dr. Tonna BoehringerSakai of general surgery.  Patient wants to avoid surgery at all possible.  We will continue with IV antibiotics.  [facility].   [PR]  1432 Patient was not [facility] to [facility] due to concerns that OB/GYN was not can be available to consult on the patient that she has a nonviable pregnancy at this time.  Patient does agree to stay at this hospital and Dr. Tonna BoehringerSakai of general surgery kindly agreed to admit patient for IV antibiotics and symptomatic control.   [PR]    Clinical Course User Index [PR] Willy Eddyobinson, Breccan Galant, MD     As part of my medical decision making, I reviewed the following data within the electronic MEDICAL RECORD NUMBER Nursing notes reviewed and incorporated, Labs reviewed, notes from prior ED visits.   ____________________________________________   FINAL CLINICAL IMPRESSION(S) / ED DIAGNOSES  Final diagnoses:  Acute epigastric pain  Acute cholecystitis      NEW MEDICATIONS STARTED DURING THIS VISIT:  New Prescriptions   No medications on file     Note:  This document was prepared using Dragon voice recognition software and may include unintentional dictation errors.    Willy Eddyobinson, Deegan Valentino, MD 11/25/17 [phone]

## 2017-11-25 NOTE — ED Notes (Signed)
FHT 150, LLQ

## 2017-11-26 LAB — PHOSPHORUS: Phosphorus: 3.8 mg/dL (ref 2.5–4.6)

## 2017-11-26 LAB — BASIC METABOLIC PANEL
Anion gap: 5 (ref 5–15)
BUN: 7 mg/dL (ref 6–20)
CALCIUM: 8.3 mg/dL — AB (ref 8.9–10.3)
CO2: 23 mmol/L (ref 22–32)
Chloride: 110 mmol/L (ref 98–111)
Creatinine, Ser: 0.55 mg/dL (ref 0.44–1.00)
GFR calc Af Amer: >60 mL/min (ref >60)
Glucose, Bld: 97 mg/dL (ref 70–99)
Potassium: 3.4 mmol/L — ABNORMAL LOW (ref 3.5–5.1)
Sodium: 138 mmol/L (ref 135–145)

## 2017-11-26 LAB — CBC
HCT: 31.8 % — ABNORMAL LOW (ref 35.0–47.0)
Hemoglobin: 11.2 g/dL — ABNORMAL LOW (ref 12.0–16.0)
MCH: 30.9 pg (ref 26.0–34.0)
MCHC: 35.1 g/dL (ref 32.0–36.0)
MCV: 88 fL (ref 80.0–100.0)
PLATELETS: 168 10*3/uL (ref 150–440)
RBC: 3.62 MIL/uL — ABNORMAL LOW (ref 3.80–5.20)
RDW: 13.6 % (ref 11.5–14.5)
WBC: 11.6 10*3/uL — ABNORMAL HIGH (ref 3.6–11.0)

## 2017-11-26 LAB — MAGNESIUM: Magnesium: 1.6 mg/dL — ABNORMAL LOW (ref 1.7–2.4)

## 2017-11-26 MED ORDER — PIPERACILLIN-TAZOBACTAM 3.375 G IVPB
3.3750 g | Freq: Three times a day (TID) | INTRAVENOUS | Status: DC
  Administered 2017-11-26 – 2017-11-27 (×4): 3.375 g via INTRAVENOUS
  Filled 2017-11-26 (×4): qty 50

## 2017-11-26 NOTE — Progress Notes (Signed)
Subjective:    Bailey Carney is a 27 y.o. female  Hospital stay day 0,   acute cholecystitis, [redacted]wks pregnant  No issues overnight.  Feeling better, tolerated roast beef sandwich without any pain.   Objective:      Temp:  [98.1 F (36.7 C)-98.3 F (36.8 C)] 98.1 F (36.7 C) (08/13 0426) Pulse Rate:  [68-96] 70 (08/13 0426) Resp:  [14-18] 16 (08/13 0426) BP: (101-128)/(60-86) 112/74 (08/13 0426) SpO2:  [96 %-100 %] 96 % (08/13 0426) Weight:  [106.6 kg] 106.6 kg (08/12 1001)     Height: 5\' 7"  (170.2 cm) Weight: 106.6 kg BMI (Calculated): 36.8   Intake/Output this shift:  No intake/output data recorded.       Constitutional :  alert, cooperative, appears stated age and no distress  Respiratory:  clear to auscultation bilaterally  Cardiovascular:  regular rate and rhythm  Gastrointestinal: soft, non-tender; bowel sounds normal; no masses,  no organomegaly.   Skin: Cool and moist.   Psychiatric: Normal affect, non-agitated, not confused       LABS:  CMP Latest Ref Rng & Units 11/26/2017 11/25/2017 10/11/2017  Glucose 70 - 99 mg/dL 97 90 83  BUN 6 - 20 mg/dL 7 8 9   Creatinine 0.44 - 1.00 mg/dL 1.610.55 0.960.59 0.450.62  Sodium 135 - 145 mmol/L 138 137 139  Potassium 3.5 - 5.1 mmol/L 3.4(L) 3.8 4.0  Chloride 98 - 111 mmol/L 110 107 106  CO2 22 - 32 mmol/L 23 24 21   Calcium 8.9 - 10.3 mg/dL 8.3(L) 8.7(L) 9.3  Total Protein 6.5 - 8.1 g/dL - 6.4(L) 6.6  Total Bilirubin 0.3 - 1.2 mg/dL - 0.5 0.6  Alkaline Phos 38 - 126 U/L - 39 44  AST 15 - 41 U/L - 29 40  ALT 0 - 44 U/L - 56(H) 64(H)   CBC Latest Ref Rng & Units 11/26/2017 11/25/2017 10/11/2017  WBC 3.6 - 11.0 K/uL 11.6(H) 10.6 9.6  Hemoglobin 12.0 - 16.0 g/dL 11.2(L) 11.8(L) 12.5  Hematocrit 35.0 - 47.0 % 31.8(L) 33.6(L) 35.8  Platelets 150 - 440 K/uL 168 161 195    RADS: n/a Assessment:   Acute cholecystitis.  Although clinically much improved and tolerating a diet at this point without any pain medications white count did go up  to 11.6.  Will switch over to Zosyn and continue to monitor while patient is in-house. she is still trying to decide whether or not to just go ahead and request discharge and be followed up as an outpatient basis.  I did tell her that the slight increase in the white count is concerning but due to clinically looking stable and improving since admission close follow-up as an outpatient basis with [place] will also be an option.

## 2017-11-26 NOTE — Progress Notes (Signed)
Pharmacy Antibiotic Note  Bailey Carney is a 27 y.o. female admitted on 11/25/2017 with acute cholecystitisPharmacy has been consulted for zosyn dosing.  Plan: Zosyn 3.375g IV q8h (4 hour infusion).  Height: 5\' 7"  (170.2 cm) Weight: 235 lb (106.6 kg) IBW/kg (Calculated) : 61.6  Temp (24hrs), Avg:98.2 F (36.8 C), Min:98.1 F (36.7 C), Max:98.3 F (36.8 C)  Recent Labs  Lab 11/25/17 1009 11/26/17 0303  WBC 10.6 11.6*  CREATININE 0.59 0.55    Estimated Creatinine Clearance: 135.1 mL/min (by C-G formula based on SCr of 0.55 mg/dL).    Allergies  Allergen Reactions  . Amoxicillin Rash and Other (See Comments)    Has patient had a PCN reaction causing immediate rash, facial/tongue/throat swelling, SOB or lightheadedness with hypotension: No Has patient had a PCN reaction causing severe rash involving mucus membranes or skin necrosis: No Has patient had a PCN reaction that required hospitalization No Has patient had a PCN reaction occurring within the last 10 years: No If all of the above answers are "NO", then may proceed with Cephalosporin use.     Thank you for allowing pharmacy to be a part of this patient's care.  Thomasene Rippleavid Maleik Vanderzee, PharmD, BCPS Clinical Pharmacist 11/26/2017

## 2017-11-27 LAB — CBC WITH DIFFERENTIAL/PLATELET
Basophils Absolute: 0 10*3/uL (ref 0–0.1)
Basophils Relative: 0 %
Eosinophils Absolute: 0.1 10*3/uL (ref 0–0.7)
Eosinophils Relative: 1 %
HEMATOCRIT: 33 % — AB (ref 35.0–47.0)
Hemoglobin: 11.4 g/dL — ABNORMAL LOW (ref 12.0–16.0)
Lymphocytes Relative: 24 %
Lymphs Abs: 2.7 10*3/uL (ref 1.0–3.6)
MCH: 30.5 pg (ref 26.0–34.0)
MCHC: 34.5 g/dL (ref 32.0–36.0)
MCV: 88.3 fL (ref 80.0–100.0)
MONO ABS: 0.8 10*3/uL (ref 0.2–0.9)
Monocytes Relative: 7 %
NEUTROS ABS: 7.9 10*3/uL — AB (ref 1.4–6.5)
Neutrophils Relative %: 68 %
Platelets: 169 10*3/uL (ref 150–440)
RBC: 3.74 MIL/uL — ABNORMAL LOW (ref 3.80–5.20)
RDW: 13.8 % (ref 11.5–14.5)
WBC: 11.5 10*3/uL — ABNORMAL HIGH (ref 3.6–11.0)

## 2017-11-27 LAB — HIV ANTIBODY (ROUTINE TESTING W REFLEX): HIV Screen 4th Generation wRfx: NONREACTIVE

## 2017-11-27 LAB — HEPATIC FUNCTION PANEL
ALT: 46 U/L — ABNORMAL HIGH (ref 0–44)
AST: 28 U/L (ref 15–41)
Albumin: 2.9 g/dL — ABNORMAL LOW (ref 3.5–5.0)
Alkaline Phosphatase: 36 U/L — ABNORMAL LOW (ref 38–126)
Bilirubin, Direct: 0.1 mg/dL (ref 0.0–0.2)
Indirect Bilirubin: 0.5 mg/dL (ref 0.3–0.9)
Total Bilirubin: 0.6 mg/dL (ref 0.3–1.2)
Total Protein: 5.9 g/dL — ABNORMAL LOW (ref 6.5–8.1)

## 2017-11-27 LAB — MAGNESIUM: Magnesium: 1.6 mg/dL — ABNORMAL LOW (ref 1.7–2.4)

## 2017-11-27 LAB — PHOSPHORUS: PHOSPHORUS: 4.7 mg/dL — AB (ref 2.5–4.6)

## 2017-11-27 MED ORDER — HYDROCODONE-ACETAMINOPHEN 5-325 MG PO TABS: 1.0000 | ORAL_TABLET | ORAL | 0 refills | Status: DC | PRN

## 2017-11-27 MED ORDER — METRONIDAZOLE 500 MG PO TABS: 500.0000 mg | ORAL_TABLET | Freq: Three times a day (TID) | ORAL | 0 refills | Status: AC

## 2017-11-27 MED ORDER — CIPROFLOXACIN HCL 500 MG PO TABS: 500.0000 mg | ORAL_TABLET | Freq: Two times a day (BID) | ORAL | 0 refills | Status: AC

## 2017-11-27 MED ORDER — TRAMADOL HCL 50 MG PO TABS: 50.0000 mg | ORAL_TABLET | Freq: Four times a day (QID) | ORAL | 0 refills | Status: AC | PRN

## 2017-11-27 NOTE — Progress Notes (Signed)
Pt discharged per MD order. IV removed. Prescriptions and work note given to pt. Pt verbalized understanding of discharge instructions. All questions answered to pt satisfaction. Pt declined wheelchair and walked to car.

## 2017-11-27 NOTE — Discharge Summary (Signed)
Physician Discharge Summary  Patient ID: Bailey Carney MRN: [id number] DOB/AGE: [birth year] 27 y.o.  Admit date: 11/25/2017 Discharge date: 11/27/2017  Admission Diagnoses: acute cholecystitis   Discharge Diagnoses:  Same as above  Discharged Condition: good  Hospital Course: Patient was admitted hospital after work-up in the possible acute cholecystitis.  Due to her [redacted] weeks pregnant status we decided to proceed with a trial of antibiotics before proceeding with surgery.  Patient was admitted and IV antibiotics started along with IV fluid resuscitation the clinical symptoms including the right upper quadrant pain nausea vomiting have all improved essentially resolved through her hospital stay while she was tolerating meals such as hamburgers for dinner.  However her white count is remains slightly elevated at 11.5 on day of discharge.  Because she is clinically stable at this point for full 48 hours into admission with no requirement of IV fluids or pain meds we have decided together that close outpatient follow-up at this point will still be safe.  I did specifically asked that she will like for me to find a surgeon [place] in [place] since that is where she lives but she declined and stated that she would like to continue to follow-up with me as an outpatient basis.  We will send her home on p.o. Cipro and Flagyl and continue to monitor her symptoms at this time.  Consults: None  Discharge Exam: Blood pressure 115/68, pulse 68, temperature 98 F (36.7 C), temperature source Oral, resp. rate 18, height 5\' 7"  (1.702 m), weight 106.6 kg, last menstrual period 07/14/2017, SpO2 100 %. General appearance: alert, cooperative, appears stated age and no distress Resp: clear to auscultation bilaterally Cardio: regular rate and rhythm GI: soft, non-tender; bowel sounds normal; no masses,  no organomegaly  Disposition:  Discharge disposition: 01-Home or Self Care       Discharge Instructions     Discharge patient   Complete by:  As directed    Discharge disposition:  01-Home or Self Care   Discharge patient date:  11/27/2017     Allergies as of 11/27/2017      Reactions   Amoxicillin Rash, Other (See Comments)   Has patient had a PCN reaction causing immediate rash, facial/tongue/throat swelling, SOB or lightheadedness with hypotension: No Has patient had a PCN reaction causing severe rash involving mucus membranes or skin necrosis: No Has patient had a PCN reaction that required hospitalization No Has patient had a PCN reaction occurring within the last 10 years: No If all of the above answers are "NO", then may proceed with Cephalosporin use.      Medication List    TAKE these medications   albuterol 108 (90 Base) MCG/ACT inhaler Commonly known as:  PROVENTIL HFA;VENTOLIN HFA Inhale 1-2 puffs into the lungs every 6 (six) hours as needed for wheezing or shortness of breath.   aspirin EC 81 MG tablet Take 1 tablet (81 mg total) by mouth daily. Take after 12 weeks for prevention of preeclampsia later in pregnancy   ciprofloxacin 500 MG tablet Commonly known as:  CIPRO Take 1 tablet (500 mg total) by mouth 2 (two) times daily for 7 days.   docusate sodium 100 MG capsule Commonly known as:  COLACE Take 1 capsule (100 mg total) by mouth 2 (two) times daily. What changed:    when to take this  reasons to take this   HYDROcodone-acetaminophen 5-325 MG tablet Commonly known as:  NORCO/VICODIN Take 1-2 tablets by mouth every 4 (four) hours as  needed for moderate pain.   labetalol 200 MG tablet Commonly known as:  NORMODYNE Take 1 tablet (200 mg total) by mouth 2 (two) times daily.   metoCLOPramide 10 MG tablet Commonly known as:  REGLAN Take 1 tablet (10 mg total) by mouth every 6 (six) hours as needed for nausea. May use for headache   metroNIDAZOLE 500 MG tablet Commonly known as:  FLAGYL Take 1 tablet (500 mg total) by mouth 3 (three) times daily for 7  days.   PRENATAL PO Take 1 tablet by mouth at bedtime.      Follow-up Information    ReinholdsSakai, Ryna Beckstrom, DO Follow up.   Specialty:  Surgery Why:  once abx is completed or sooner if worsening symptoms again Contact information: [address] [place] [address] [phone]            Total time spent arranging discharge was >2730min. Signed: Sung Amabilesami Madisynn Plair 11/27/2017, 8:10 AM

## 2017-11-28 ENCOUNTER — Encounter (HOSPITAL_COMMUNITY): Payer: Self-pay

## 2017-12-03 ENCOUNTER — Encounter: Payer: Self-pay | Admitting: Obstetrics and Gynecology

## 2017-12-04 ENCOUNTER — Encounter (HOSPITAL_COMMUNITY): Payer: Self-pay

## 2017-12-04 ENCOUNTER — Ambulatory Visit (INDEPENDENT_AMBULATORY_CARE_PROVIDER_SITE_OTHER): Payer: Medicaid Other | Admitting: Obstetrics and Gynecology

## 2017-12-04 ENCOUNTER — Encounter: Payer: Self-pay | Admitting: Obstetrics and Gynecology

## 2017-12-04 VITALS — BP 126/78 | HR 72 | Wt 232.7 lb

## 2017-12-04 DIAGNOSIS — O0990 Supervision of high risk pregnancy, unspecified, unspecified trimester: Secondary | ICD-10-CM

## 2017-12-04 DIAGNOSIS — K810 Acute cholecystitis: Secondary | ICD-10-CM

## 2017-12-04 DIAGNOSIS — O10919 Unspecified pre-existing hypertension complicating pregnancy, unspecified trimester: Secondary | ICD-10-CM

## 2017-12-04 DIAGNOSIS — Z8619 Personal history of other infectious and parasitic diseases: Secondary | ICD-10-CM

## 2017-12-04 NOTE — Progress Notes (Signed)
   PRENATAL VISIT NOTE  Subjective:  Bailey Carney is a 27 y.o. G1P0000 at 5851w5d being seen today for ongoing prenatal care.  She is currently monitored for the following issues for this high-risk pregnancy and has Asthma; Hypertension; Chronic hypertension during pregnancy, antepartum; Supervision of high risk pregnancy, antepartum; History of ELISA positive for HSV; and Acute cholecystitis on their problem list.  Patient reports no complaints.  Contractions: Not present. Vag. Bleeding: None.   . Denies leaking of fluid. Thinks she may be feeling baby move but not sure.  The following portions of the patient's history were reviewed and updated as appropriate: allergies, current medications, past family history, past medical history, past social history, past surgical history and problem list. Problem list updated.  Objective:   Vitals:   12/04/17 1541  BP: 126/78  Pulse: 72  Weight: 232 lb 11.2 oz (105.6 kg)    Fetal Status: Fetal Heart Rate (bpm): 155         General:  Alert, oriented and cooperative. Patient is in no acute distress.  Skin: Skin is warm and dry. No rash noted.   Cardiovascular: Normal heart rate noted  Respiratory: Normal respiratory effort, no problems with respiration noted  Abdomen: Soft, gravid, appropriate for gestational age.  Pain/Pressure: Present     Pelvic: Cervical exam deferred        Extremities: Normal range of motion.  Edema: Trace  Mental Status: Normal mood and affect. Normal behavior. Normal judgment and thought content.   Assessment and Plan:  Pregnancy: G1P0000 at 6751w5d   1. Supervision of high risk pregnancy, antepartum - AFP, Serum, Open Spina Bifida  2. History of ELISA positive for HSV ppx in labor  3. Chronic hypertension during pregnancy, antepartum Labetalol 200 mg BID Cont baby ASA  4. Acute cholecystitis Still on antibiotics from acute episode, is not having any more pain Is adhering to diet Plans for cholecystectomy  after delivery   Preterm labor symptoms and general obstetric precautions including but not limited to vaginal bleeding, contractions, leaking of fluid and fetal movement were reviewed in detail with the patient. Please refer to After Visit Summary for other counseling recommendations.  Return in about 4 weeks (around 01/01/2018) for OB visit (MD).  Future Appointments  Date Time Provider Department Center  12/06/2017  9:30 AM [facility] [place] 5 [facility]    Conan BowensKelly M Guyla Bless, MD

## 2017-12-06 ENCOUNTER — Encounter (HOSPITAL_COMMUNITY): Payer: Self-pay

## 2017-12-06 ENCOUNTER — Telehealth: Payer: Self-pay | Admitting: General Practice

## 2017-12-06 ENCOUNTER — Ambulatory Visit (HOSPITAL_COMMUNITY)
Admission: RE | Admit: 2017-12-06 | Discharge: 2017-12-06 | Disposition: A | Discharge status: Home / Self Care | Payer: Medicaid Other | Source: Ambulatory Visit | Attending: Obstetrics and Gynecology | Admitting: Obstetrics and Gynecology

## 2017-12-06 ENCOUNTER — Other Ambulatory Visit (HOSPITAL_COMMUNITY): Payer: Self-pay | Admitting: *Deleted

## 2017-12-06 DIAGNOSIS — O0990 Supervision of high risk pregnancy, unspecified, unspecified trimester: Secondary | ICD-10-CM | POA: Diagnosis present

## 2017-12-06 DIAGNOSIS — Z3A19 19 weeks gestation of pregnancy: Secondary | ICD-10-CM | POA: Insufficient documentation

## 2017-12-06 DIAGNOSIS — O99212 Obesity complicating pregnancy, second trimester: Secondary | ICD-10-CM

## 2017-12-06 DIAGNOSIS — O10012 Pre-existing essential hypertension complicating pregnancy, second trimester: Secondary | ICD-10-CM

## 2017-12-06 DIAGNOSIS — B3731 Acute candidiasis of vulva and vagina: Secondary | ICD-10-CM

## 2017-12-06 DIAGNOSIS — Z362 Encounter for other antenatal screening follow-up: Secondary | ICD-10-CM

## 2017-12-06 DIAGNOSIS — Z363 Encounter for antenatal screening for malformations: Secondary | ICD-10-CM | POA: Diagnosis not present

## 2017-12-06 DIAGNOSIS — B373 Candidiasis of vulva and vagina: Secondary | ICD-10-CM

## 2017-12-06 DIAGNOSIS — O0992 Supervision of high risk pregnancy, unspecified, second trimester: Secondary | ICD-10-CM | POA: Diagnosis not present

## 2017-12-06 HISTORY — DX: Cholecystitis, unspecified: K81.9

## 2017-12-06 LAB — AFP, SERUM, OPEN SPINA BIFIDA
AFP MOM: 1.1
AFP VALUE AFPOSL: 43.1 ng/mL
Gest. Age on Collection Date: 18.7 weeks
MATERNAL AGE AT EDD: 25.4 a
OSBR Risk 1 IN: 10000
Test Results:: NEGATIVE
Weight: 232 [lb_av]

## 2017-12-06 NOTE — Telephone Encounter (Signed)
Patient called & left message on nurse voicemail line requesting a call back regarding her medications.

## 2017-12-11 ENCOUNTER — Encounter: Payer: Self-pay | Admitting: *Deleted

## 2017-12-11 ENCOUNTER — Telehealth: Payer: Self-pay | Admitting: *Deleted

## 2017-12-11 MED ORDER — CLOTRIMAZOLE 1 % VA CREA: 1.0000 | TOPICAL_CREAM | Freq: Every day | VAGINAL | 0 refills | Status: DC

## 2017-12-11 MED ORDER — TERCONAZOLE 0.4 % VA CREA: 1.0000 | TOPICAL_CREAM | Freq: Every day | VAGINAL | 0 refills | Status: AC

## 2017-12-11 NOTE — Telephone Encounter (Signed)
Pt called and stated that she is still waiting for refill of Terconazole.  She had called last week and has not had a response. She is having vaginal itching and irritation. She recently finished a course of Cipro & Flagyl for cholecystitis. I advised that I will check with the doctor and send a message to her via MyChart regarding the refill. Rx for Terconazole approved by Dr. Earlene Plateravis.

## 2017-12-23 ENCOUNTER — Ambulatory Visit (INDEPENDENT_AMBULATORY_CARE_PROVIDER_SITE_OTHER): Payer: Medicaid Other | Admitting: Family Medicine

## 2017-12-23 ENCOUNTER — Encounter: Payer: Self-pay | Admitting: Family Medicine

## 2017-12-23 VITALS — BP 111/78 | HR 87 | Temp 98.7°F | Wt 234.0 lb

## 2017-12-23 DIAGNOSIS — K8020 Calculus of gallbladder without cholecystitis without obstruction: Secondary | ICD-10-CM

## 2017-12-23 DIAGNOSIS — O26892 Other specified pregnancy related conditions, second trimester: Secondary | ICD-10-CM

## 2017-12-23 DIAGNOSIS — R1011 Right upper quadrant pain: Secondary | ICD-10-CM

## 2017-12-23 NOTE — Progress Notes (Signed)
   ACUTE OFFICE VISIT NOTE Subjective:  Bailey Carney is a 27 y.o. G1P0000 at [redacted]w[redacted]d being seen today for acute RUQ abdominal pain. She presented today for same-day clinic with this complaint. Was admitted to the hospital about a month ago on the surgery service for acute cholecystitis. U/S with gallstones, sludge and positive Murphy sign. She was started on IVF and antibiotics. She was discharged on Cipro and Flagyl.   - here for abdominal pain, started this morning without any cause, hadn't had anything to eat - some nausea - had surgery f/u last week and no pain so said nothing else to do per surgery - a little epigastric pain, some burning but not much  - no fevers, no vomiting - some constipation, but went this morning without any problem - no vaginal itching, burning - doesn't drink much water  - had a meal at lunch without recurrence of symptoms  The following portions of the patient's history were reviewed and updated as appropriate: allergies, current medications, past family history, past medical history, past social history, past surgical history and problem list. Problem list updated.   Objective:   Vitals:   12/23/17 1821  BP: 111/78  Pulse: 87  Temp: 98.7 F (37.1 C)  Weight: 234 lb (106.1 kg)   General:  Alert, oriented and cooperative. Patient is in no acute distress.  Skin: Skin is warm and dry. No rash noted.   Cardiovascular: Normal heart rate noted  Respiratory: Normal respiratory effort, no problems with respiration noted  Abdomen: Soft, gravid, appropriate for gestational age. Mild TTP RUQ, negative Murphy sign. Normal bowel sounds.    Pelvic: Cervical exam deferred        Extremities: Normal range of motion.     Mental Status: Normal mood and affect. Normal behavior. Normal judgment and thought content.   Assessment and Plan:  G1P0000 at [redacted]w[redacted]d here for acute right upper quadrant abdominal pain. Has recent history of acute cholecystitis treated with  antibiotics. Pain has lessened since first started this morning. Discussed possibility of recurring pain from gallstones and nature of biliary colic. Discussed indications for cholecystectomy during pregnancy would be more severe presentations. Counseled on return precautions such as fever, worsening pain, nausea/vomiting. Encouraged her to notify surgeon again if symptoms recur.   Preterm labor symptoms and general obstetric precautions including but not limited to vaginal bleeding, contractions, leaking of fluid and fetal movement were reviewed in detail with the patient. I will see her next week for her prenatal visit.   Please refer to After Visit Summary for other counseling recommendations.  Return in about 1 week (around 12/30/2017).  Future Appointments  Date Time Provider Department Center  01/01/2018  9:35 AM Tamera Stands, DO [facility]  01/03/2018  9:45 AM [facility] [place] 2 [facility]    Tamera Stands, DO

## 2017-12-23 NOTE — Patient Instructions (Signed)
Return to clinic and call your surgeon if vomiting, fever or worsening pain. If after hours, you can go to [facility].

## 2017-12-23 NOTE — Progress Notes (Signed)
Hx gallstones and HTN. Thinks pain is due to gallstones. Started this am before eating anything. Avoids fried, greasy foods. Pain is better now compared to this am. Denies vag bleeding. Finished antibiotic for gallstones mid August and then thinks had yeast so used vag cream for a wk. Unsure if cleared. Still has some irritation of vagina. FHR by doppler today is 145

## 2018-01-01 ENCOUNTER — Ambulatory Visit (INDEPENDENT_AMBULATORY_CARE_PROVIDER_SITE_OTHER): Payer: Medicaid Other | Admitting: Family Medicine

## 2018-01-01 VITALS — BP 116/70 | HR 86 | Wt 236.5 lb

## 2018-01-01 DIAGNOSIS — O0990 Supervision of high risk pregnancy, unspecified, unspecified trimester: Secondary | ICD-10-CM

## 2018-01-01 DIAGNOSIS — Z23 Encounter for immunization: Secondary | ICD-10-CM

## 2018-01-01 DIAGNOSIS — O0992 Supervision of high risk pregnancy, unspecified, second trimester: Secondary | ICD-10-CM

## 2018-01-01 DIAGNOSIS — O10912 Unspecified pre-existing hypertension complicating pregnancy, second trimester: Secondary | ICD-10-CM | POA: Diagnosis not present

## 2018-01-01 DIAGNOSIS — K810 Acute cholecystitis: Secondary | ICD-10-CM | POA: Diagnosis not present

## 2018-01-01 DIAGNOSIS — O10919 Unspecified pre-existing hypertension complicating pregnancy, unspecified trimester: Secondary | ICD-10-CM

## 2018-01-01 MED ORDER — LABETALOL HCL 100 MG PO TABS: 100.0000 mg | ORAL_TABLET | Freq: Two times a day (BID) | ORAL | 3 refills | Status: DC

## 2018-01-01 NOTE — Progress Notes (Signed)
   PRENATAL VISIT NOTE  Subjective:  Bailey Carney is a 27 y.o. G1P0000 at 4548w5d being seen today for ongoing prenatal care.  She is currently monitored for the following issues for this high-risk pregnancy and has Asthma; Hypertension; Chronic hypertension during pregnancy, antepartum; Supervision of high risk pregnancy, antepartum; History of ELISA positive for HSV; and Acute cholecystitis on their problem list.   - no more RUQ abdominal pain since visit last week - urinary irritation resolved - no headaches, vision changes - working at call center, sitting down most of the day - side pain thinks from sleeping funny, often ends up on her stomach   Patient reports fatigue, side pain.  Contractions: Not present. Vag. Bleeding: None.  Movement: Present. Denies leaking of fluid.   The following portions of the patient's history were reviewed and updated as appropriate: allergies, current medications, past family history, past medical history, past social history, past surgical history and problem list. Problem list updated.  Objective:   Vitals:   01/01/18 1009  BP: 116/70  Pulse: 86  Weight: 236 lb 8 oz (107.3 kg)   Fetal Status: Fetal Heart Rate (bpm): 154   Movement: Present     General:  Alert, oriented and cooperative. Patient is in no acute distress.  Skin: Skin is warm and dry. No rash noted.   Cardiovascular: Normal heart rate noted  Respiratory: Normal respiratory effort, no problems with respiration noted  Abdomen: Soft, gravid, appropriate for gestational age.  Pain/Pressure: Absent     Pelvic: Cervical exam deferred        Extremities: Normal range of motion.  Edema: Trace  Mental Status: Normal mood and affect. Normal behavior. Normal judgment and thought content.   Assessment and Plan:  Pregnancy: G1P0000 at 3548w5d  1. Supervision of high risk pregnancy, antepartum -- prenatal record reviewed and UTD -- ordered 28-wk labs for next visit  -- Flu Vaccine QUAD 36+  mos IM  2. Chronic hypertension during pregnancy, antepartum: Well-controlled. Currently on 200mg  BID of labetalol and BP 110s systolic. Will decrease to 100mg  BID with goal pressure being higher to ensure good placental blood flow, fetal growth. Counseled patient that will likely need higher dose again later in pregnancy.  -- continue ASA -- labetalol 100mg  BID   Preterm labor symptoms and general obstetric precautions including but not limited to vaginal bleeding, contractions, leaking of fluid and fetal movement were reviewed in detail with the patient. Please refer to After Visit Summary for other counseling recommendations.  Return in about 5 weeks (around 02/05/2018) for return HROB (am appt).  Future Appointments  Date Time Provider Department Center  01/03/2018  9:45 AM [facility] US 2 [facility]US [facility]-US  01/29/2018  9:30 AM [facility] LAB [facility]  01/29/2018 10:35 AM Conan Bowensavis[facility] Kelly [facility]   Tamera StandsLaurel S Leslieanne Cobarrubias, DO

## 2018-01-03 ENCOUNTER — Ambulatory Visit (HOSPITAL_COMMUNITY)
Admission: RE | Admit: 2018-01-03 | Discharge: 2018-01-03 | Disposition: A | Discharge status: Home / Self Care | Payer: Medicaid Other | Source: Ambulatory Visit | Attending: Obstetrics and Gynecology | Admitting: Obstetrics and Gynecology

## 2018-01-03 ENCOUNTER — Encounter (HOSPITAL_COMMUNITY): Payer: Self-pay

## 2018-01-03 ENCOUNTER — Other Ambulatory Visit (HOSPITAL_COMMUNITY): Payer: Self-pay | Admitting: *Deleted

## 2018-01-03 DIAGNOSIS — O9989 Other specified diseases and conditions complicating pregnancy, childbirth and the puerperium: Secondary | ICD-10-CM

## 2018-01-03 DIAGNOSIS — O10012 Pre-existing essential hypertension complicating pregnancy, second trimester: Secondary | ICD-10-CM

## 2018-01-03 DIAGNOSIS — Z3A23 23 weeks gestation of pregnancy: Secondary | ICD-10-CM | POA: Diagnosis not present

## 2018-01-03 DIAGNOSIS — Z362 Encounter for other antenatal screening follow-up: Secondary | ICD-10-CM

## 2018-01-03 DIAGNOSIS — O99212 Obesity complicating pregnancy, second trimester: Secondary | ICD-10-CM

## 2018-01-03 DIAGNOSIS — O10919 Unspecified pre-existing hypertension complicating pregnancy, unspecified trimester: Secondary | ICD-10-CM

## 2018-01-03 DIAGNOSIS — J45909 Unspecified asthma, uncomplicated: Secondary | ICD-10-CM

## 2018-01-06 ENCOUNTER — Encounter: Payer: Self-pay | Admitting: *Deleted

## 2018-01-06 ENCOUNTER — Ambulatory Visit (INDEPENDENT_AMBULATORY_CARE_PROVIDER_SITE_OTHER): Payer: Medicaid Other | Admitting: Advanced Practice Midwife

## 2018-01-06 ENCOUNTER — Encounter: Payer: Self-pay | Admitting: Advanced Practice Midwife

## 2018-01-06 VITALS — BP 123/66 | HR 77 | Wt 238.0 lb

## 2018-01-06 DIAGNOSIS — O99612 Diseases of the digestive system complicating pregnancy, second trimester: Secondary | ICD-10-CM

## 2018-01-06 DIAGNOSIS — K5900 Constipation, unspecified: Secondary | ICD-10-CM

## 2018-01-06 DIAGNOSIS — R109 Unspecified abdominal pain: Secondary | ICD-10-CM

## 2018-01-06 DIAGNOSIS — K625 Hemorrhage of anus and rectum: Secondary | ICD-10-CM

## 2018-01-06 DIAGNOSIS — O26892 Other specified pregnancy related conditions, second trimester: Secondary | ICD-10-CM

## 2018-01-06 MED ORDER — DOCUSATE SODIUM 100 MG PO CAPS: 100.0000 mg | ORAL_CAPSULE | Freq: Two times a day (BID) | ORAL | 2 refills | Status: DC | PRN

## 2018-01-06 MED ORDER — POLYETHYLENE GLYCOL 3350 17 GM/SCOOP PO POWD: 17.0000 g | Freq: Every day | ORAL | 0 refills | Status: DC

## 2018-01-06 NOTE — Progress Notes (Signed)
   PRENATAL VISIT NOTE  Subjective:  Bailey Carney is a 27 y.o. G1P0000 at 40106w3d being seen today for a problem visit.  She is currently monitored for the following issues for this high-risk pregnancy and has Asthma; Hypertension; Chronic hypertension during pregnancy, antepartum; Supervision of high risk pregnancy, antepartum; History of ELISA positive for HSV; and Acute cholecystitis on their problem list.  Patient reports bright red bleeding with bowel movement today at 3 pm. Some intermittent low abdominal cramping following BM.[name]. Denies leaking of fluid.   The following portions of the patient's history were reviewed and updated as appropriate: allergies, current medications, past family history, past medical history, past social history, past surgical history and problem list. Problem list updated.  Objective:   Vitals:   01/06/18 1755  BP: 123/66  Pulse: 77  Weight: 108 kg    Fetal Status:           VS reviewed, nursing note reviewed,  Constitutional: well developed, well nourished, no distress HEENT: normocephalic CV: normal rate Pulm/chest wall: normal effort Abdomen: soft Neuro: alert and oriented x 3 Skin: warm, dry Psych: affect normal  Pelvic exam: Cervix pink, visually closed and long, without lesion, scant white creamy discharge, no bleeding noted, vaginal walls and external genitalia normal  Digital rectal exam performed with normal findings, no visible external hemorrhoids and no palpable internal hemorrhoids or other masses. No blood on glove following exam.  Assessment and Plan:  Pregnancy: G1P0000 at 56106w3d  1. Bright red rectal bleeding X 1 episode after bowel movement today  2. Abdominal pain during pregnancy in second trimester -Cervix closed/long, no evidence of PTL  3. Constipation during pregnancy in second trimester --Likely cause of rectal bleeding, no internal hemorrhoids on exam but not ruled out as source of bleeding.  Will treat  constipation, pt given precautions/reasons to return to care for bleeding. - docusate sodium (COLACE) 100 MG capsule; Take 1 capsule (100 mg total) by mouth 2 (two) times daily as needed.  Dispense: 30 capsule; Refill: 2 - polyethylene glycol powder (GLYCOLAX/MIRALAX) powder; Take 17 g by mouth daily.  Dispense: 255 g; Refill: 0  Preterm labor symptoms and general obstetric precautions including but not limited to vaginal bleeding, contractions, leaking of fluid and fetal movement were reviewed in detail with the patient. Please refer to After Visit Summary for other counseling recommendations.  No follow-ups on file.  Future Appointments  Date Time Provider Department Center  01/29/2018  9:30 AM [facility] LAB [facility]  01/29/2018 10:35 AM Conan Bowensavis, Kelly M, MD [facility]OC  01/31/2018  8:30 AM [facility] US 1 [facility]US M[facility]-US    Sharen CounterLisa Leftwich-Kirby, CNM

## 2018-01-06 NOTE — Progress Notes (Signed)
Noticed blood in my stool about 1500. Since then having some pain in lower abd and my rectum hurts. No hemorrhoids and first time seeing blood in stools. Has been constipated last 2 days. Took MOM last night to have BM today. FHR today 155 by doppler

## 2018-01-06 NOTE — Patient Instructions (Signed)
Constipation, Adult Constipation is when a person has fewer bowel movements in a week than normal, has difficulty having a bowel movement, or has stools that are dry, hard, or larger than normal. Constipation may be caused by an underlying condition. It may become worse with age if a person takes certain medicines and does not take in enough fluids. Follow these instructions at home: Eating and drinking   Eat foods that have a lot of fiber, such as fresh fruits and vegetables, whole grains, and beans.  Limit foods that are high in fat, low in fiber, or overly processed, such as french fries, hamburgers, cookies, candies, and soda.  Drink enough fluid to keep your urine clear or pale yellow. General instructions  Exercise regularly or as told by your health care provider.  Go to the restroom when you have the urge to go. Do not hold it in.  Take over-the-counter and prescription medicines only as told by your health care provider. These include any fiber supplements.  Practice pelvic floor retraining exercises, such as deep breathing while relaxing the lower abdomen and pelvic floor relaxation during bowel movements.  Watch your condition for any changes.  Keep all follow-up visits as told by your health care provider. This is important. Contact a health care provider if:  You have pain that gets worse.  You have a fever.  You do not have a bowel movement after 4 days.  You vomit.  You are not hungry.  You lose weight.  You are bleeding from the anus.  You have thin, pencil-like stools. Get help right away if:  You have a fever and your symptoms suddenly get worse.  You leak stool or have blood in your stool.  Your abdomen is bloated.  You have severe pain in your abdomen.  You feel dizzy or you faint. This information is not intended to replace advice given to you by your health care provider. Make sure you discuss any questions you have with your health care  provider. Document Released: 12/30/2003 Document Revised: 10/21/2015 Document Reviewed: 09/21/2015 Elsevier Interactive Patient Education  2018 [employer] High-Fiber Diet Fiber, also called dietary fiber, is a type of carbohydrate found in fruits, vegetables, whole grains, and beans. A high-fiber diet can have many health benefits. Your health care provider may recommend a high-fiber diet to help:  Prevent constipation. Fiber can make your bowel movements more regular.  Lower your cholesterol.  Relieve hemorrhoids, uncomplicated diverticulosis, or irritable bowel syndrome.  Prevent overeating as part of a weight-loss plan.  Prevent heart disease, type 2 diabetes, and certain cancers.  What is my plan? The recommended daily intake of fiber includes:  38 grams for men under age 50.  30 grams for men over age 50.  25 grams for women under age 50.  21 grams for women over age 50.  You can get the recommended daily intake of dietary fiber by eating a variety of fruits, vegetables, grains, and beans. Your health care provider may also recommend a fiber supplement if it is not possible to get enough fiber through your diet. What do I need to know about a high-fiber diet?  Fiber supplements have not been widely studied for their effectiveness, so it is better to get fiber through food sources.  Always check the fiber content on thenutrition facts label of any prepackaged food. Look for foods that contain at least 5 grams of fiber per serving.  Ask your dietitian if you have questions about specific   foods that are related to your condition, especially if those foods are not listed in the following section.  Increase your daily fiber consumption gradually. Increasing your intake of dietary fiber too quickly may cause bloating, cramping, or gas.  Drink plenty of water. Water helps you to digest fiber. What foods can I eat? Grains Whole-grain breads. Multigrain cereal. Oats and  oatmeal. Brown rice. Barley. Bulgur wheat. Millet. Bran muffins. Popcorn. Rye wafer crackers. Vegetables Sweet potatoes. Spinach. Kale. Artichokes. Cabbage. Broccoli. Green peas. Carrots. Squash. Fruits Berries. Pears. Apples. Oranges. Avocados. Prunes and raisins. Dried figs. Meats and Other Protein Sources Navy, kidney, pinto, and soy beans. Split peas. Lentils. Nuts and seeds. Dairy Fiber-fortified yogurt. Beverages Fiber-fortified soy milk. Fiber-fortified orange juice. Other Fiber bars. The items listed above may not be a complete list of recommended foods or beverages. Contact your dietitian for more options. What foods are not recommended? Grains White bread. Pasta made with refined flour. White rice. Vegetables Fried potatoes. Canned vegetables. Well-cooked vegetables. Fruits Fruit juice. Cooked, strained fruit. Meats and Other Protein Sources Fatty cuts of meat. Fried poultry or fried fish. Dairy Milk. Yogurt. Cream cheese. Sour cream. Beverages Soft drinks. Other Cakes and pastries. Butter and oils. The items listed above may not be a complete list of foods and beverages to avoid. Contact your dietitian for more information. What are some tips for including high-fiber foods in my diet?  Eat a wide variety of high-fiber foods.  Make sure that half of all grains consumed each day are whole grains.  Replace breads and cereals made from refined flour or white flour with whole-grain breads and cereals.  Replace white rice with brown rice, bulgur wheat, or millet.  Start the day with a breakfast that is high in fiber, such as a cereal that contains at least 5 grams of fiber per serving.  Use beans in place of meat in soups, salads, or pasta.  Eat high-fiber snacks, such as berries, raw vegetables, nuts, or popcorn. This information is not intended to replace advice given to you by your health care provider. Make sure you discuss any questions you have with your health  care provider. Document Released: 04/02/2005 Document Revised: 09/08/2015 Document Reviewed: 09/15/2013 Elsevier Interactive Patient Education  2018 Elsevier Inc.  

## 2018-01-13 ENCOUNTER — Encounter: Payer: Self-pay | Admitting: Internal Medicine

## 2018-01-13 ENCOUNTER — Ambulatory Visit (INDEPENDENT_AMBULATORY_CARE_PROVIDER_SITE_OTHER): Payer: Medicaid Other | Admitting: Internal Medicine

## 2018-01-13 VITALS — BP 118/71 | HR 75 | Ht 66.0 in | Wt 237.0 lb

## 2018-01-13 DIAGNOSIS — O26892 Other specified pregnancy related conditions, second trimester: Secondary | ICD-10-CM

## 2018-01-13 DIAGNOSIS — R109 Unspecified abdominal pain: Secondary | ICD-10-CM

## 2018-01-13 LAB — POCT URINALYSIS DIP (DEVICE)
Bilirubin Urine: NEGATIVE
GLUCOSE, UA: NEGATIVE mg/dL
HGB URINE DIPSTICK: NEGATIVE
Ketones, ur: NEGATIVE mg/dL
Nitrite: NEGATIVE
PROTEIN: NEGATIVE mg/dL
SPECIFIC GRAVITY, URINE: 1.025 (ref 1.005–1.030)
UROBILINOGEN UA: 0.2 mg/dL (ref 0.0–1.0)
pH: 6.5 (ref 5.0–8.0)

## 2018-01-13 NOTE — Patient Instructions (Signed)
I believe your abdominal pain is related to constipation. Pick up those medications from the pharmacy. Make sure you are drinking plenty of fluids and eating foods high in fiber.   I am reassured that it is not Pre-Eclampsia causing your pain since the pain is everywhere in the belly, your blood pressure is normal, and you do not have any protein in your urine.

## 2018-01-13 NOTE — Progress Notes (Signed)
Pt is concerned that she may be developing pre-eclampsia. She states she had sharp abdominal pain earlier today and has been having some intermittent abdominal pain prior to today. She also noticed that she had protein in her urine @ previous office visits and read on-line  that this is a symptom of pre-e. She was told by the doctor @ [facility] that pain in her abdomen can be a symptom of pre-eclampsia. She also has some pain in her Lt shoulder.  FHR per doppler = 146 bpm.

## 2018-01-14 NOTE — Progress Notes (Signed)
   Subjective:    Bailey Carney - 27 y.o. female MRN [id number]  Date of birth: 10-20-1990  HPI  Bailey Carney is a 27 y.o. G71P0000 female here for abdominal pain. Reports diffuse abdominal pain throughout the last few days that occurs intermittently. Describes it as cramping and aching pain. She is concerned because [facility] told her that pain in her abdomen could be a symptom of pre-eclampsia. She denies nausea, vomiting, heartburn symptoms. Endorses constipation reporting her last BM was 3 days ago. Was seen for clinic visit on 9/23 and prescribed Colace and Miralax. She has not started either. Denies headaches, vision changes, localizing RUQ pain.     OB History    Gravida  1   Para  0   Term  0   Preterm  0   AB  0   Living  0     SAB  0   TAB  0   Ectopic  0   Multiple  0   Live Births              -  reports that she has never smoked. She has never used smokeless tobacco. - Review of Systems: Per HPI. - Past Medical History: Patient Active Problem List   Diagnosis Date Noted  . Acute cholecystitis 11/25/2017  . History of ELISA positive for HSV 10/11/2017  . Chronic hypertension during pregnancy, antepartum 09/06/2017  . Supervision of high risk pregnancy, antepartum 09/06/2017  . Asthma   . Hypertension    - Medications: reviewed and updated   Objective:   Physical Exam BP 118/71   Pulse 75   Ht 5\' 6"  (1.676 m)   Wt 237 lb (107.5 kg)   LMP 07/14/2017 (Exact Date)   BMI 38.25 kg/m  Gen: NAD, alert, cooperative with exam, well-appearing Abd: SNTND, BS present, no guarding or organomegaly, appropriately gravid for dates, obese   Assessment & Plan:   1. Abdominal pain during pregnancy in second trimester Suspect related to constipation. Instructed patient to pick up medications prescribed at recent visit. Abdominal pain is non-localizing and there is no TTP, guarding or rebound on exam with stable vital signs making my suspicion for an acute  intraabdominal process quite low. Discussed with patient that localized RUQ pain can be a symptom of Pre-Eclampsia. Discussed that due to her history of cHTN she is at higher risk during pregnancy but provided reassurance that her BP is stable, her PIH baseline labs at initial OB visit were unremarkable and she had no protein on urine dipstick today.    Routine preventative health maintenance measures emphasized. Please refer to After Visit Summary for other counseling recommendations.   Return in about 16 days (around 01/29/2018) for as scheduled.  Marcy Siren, D.O. OB Fellow  01/14/2018, 6:31 PM

## 2018-01-22 ENCOUNTER — Telehealth: Payer: Self-pay

## 2018-01-22 DIAGNOSIS — B379 Candidiasis, unspecified: Secondary | ICD-10-CM

## 2018-01-22 NOTE — Telephone Encounter (Signed)
Pt is requesting a refill on her vaginal cream for yeast infection.

## 2018-01-23 MED ORDER — TERCONAZOLE 0.4 % VA CREA: 1.0000 | TOPICAL_CREAM | Freq: Every day | VAGINAL | 0 refills | Status: DC

## 2018-01-23 NOTE — Telephone Encounter (Signed)
Called patient stating I am trying to reach you to return your phone call. Patient reports vaginal itching and has been using previous cream from last infection but has ran out. Informed patient of Rx refill. Patient verbalized understanding & had no questions.

## 2018-01-28 ENCOUNTER — Other Ambulatory Visit: Payer: Self-pay | Admitting: *Deleted

## 2018-01-28 DIAGNOSIS — O0990 Supervision of high risk pregnancy, unspecified, unspecified trimester: Secondary | ICD-10-CM

## 2018-01-29 ENCOUNTER — Ambulatory Visit (INDEPENDENT_AMBULATORY_CARE_PROVIDER_SITE_OTHER): Payer: Medicaid Other | Admitting: Obstetrics and Gynecology

## 2018-01-29 ENCOUNTER — Other Ambulatory Visit: Payer: Medicaid Other

## 2018-01-29 VITALS — BP 124/80 | HR 100 | Wt 237.9 lb

## 2018-01-29 DIAGNOSIS — K810 Acute cholecystitis: Secondary | ICD-10-CM

## 2018-01-29 DIAGNOSIS — Z23 Encounter for immunization: Secondary | ICD-10-CM | POA: Diagnosis not present

## 2018-01-29 DIAGNOSIS — O0992 Supervision of high risk pregnancy, unspecified, second trimester: Secondary | ICD-10-CM | POA: Diagnosis not present

## 2018-01-29 DIAGNOSIS — O10919 Unspecified pre-existing hypertension complicating pregnancy, unspecified trimester: Secondary | ICD-10-CM

## 2018-01-29 DIAGNOSIS — O0990 Supervision of high risk pregnancy, unspecified, unspecified trimester: Secondary | ICD-10-CM

## 2018-01-29 DIAGNOSIS — Z8619 Personal history of other infectious and parasitic diseases: Secondary | ICD-10-CM

## 2018-01-29 DIAGNOSIS — O10912 Unspecified pre-existing hypertension complicating pregnancy, second trimester: Secondary | ICD-10-CM | POA: Diagnosis not present

## 2018-01-29 NOTE — Progress Notes (Signed)
   PRENATAL VISIT NOTE  Subjective:  Bailey Carney is a 27 y.o. G1P0000 at [redacted]w[redacted]d being seen today for ongoing prenatal care.  She is currently monitored for the following issues for this high-risk pregnancy and has Asthma; Hypertension; Chronic hypertension during pregnancy, antepartum; Supervision of high risk pregnancy, antepartum; History of ELISA positive for HSV; and Acute cholecystitis on their problem list.  Patient reports no complaints. Some numbness and tingling in left leg, some swelling bilaterally.  Contractions: Not present. Vag. Bleeding: None.  Movement: Present. Denies leaking of fluid.   The following portions of the patient's history were reviewed and updated as appropriate: allergies, current medications, past family history, past medical history, past social history, past surgical history and problem list. Problem list updated.  Objective:   Vitals:   01/29/18 1002  BP: 124/80  Pulse: 100  Weight: 237 lb 14.4 oz (107.9 kg)    Fetal Status: Fetal Heart Rate (bpm): 148   Movement: Present     General:  Alert, oriented and cooperative. Patient is in no acute distress.  Skin: Skin is warm and dry. No rash noted.   Cardiovascular: Normal heart rate noted  Respiratory: Normal respiratory effort, no problems with respiration noted  Abdomen: Soft, gravid, appropriate for gestational age.  Pain/Pressure: Present     Pelvic: Cervical exam deferred        Extremities: Normal range of motion.  Edema: Trace no erythema, legs warm bilaterally, no signs of DVT, no tenderness to palpation  Mental Status: Normal mood and affect. Normal behavior. Normal judgment and thought content.   Assessment and Plan:  Pregnancy: G1P0000 at [redacted]w[redacted]d  1. Supervision of high risk pregnancy, antepartum Gave circ info Recommended elevating legs as able  2. Chronic hypertension during pregnancy, antepartum Labetalol 100 mg BID Cont baby ASA  3. Acute cholecystitis S/p antibiotics  For  cholecystectomy pp  4. History of ELISA positive for HSV ppx 34 weeks   Preterm labor symptoms and general obstetric precautions including but not limited to vaginal bleeding, contractions, leaking of fluid and fetal movement were reviewed in detail with the patient. Please refer to After Visit Summary for other counseling recommendations.  Return in about 2 weeks (around 02/12/2018).  Future Appointments  Date Time Provider Department Center  01/31/2018  8:30 AM [facility] [place] 1 [facility]    Conan Bowens, MD

## 2018-01-29 NOTE — Progress Notes (Signed)
Pt concerned of swelling & pain in leg

## 2018-01-29 NOTE — Patient Instructions (Signed)

## 2018-01-30 LAB — CBC
HEMATOCRIT: 34.2 % (ref 34.0–46.6)
HEMOGLOBIN: 11.7 g/dL (ref 11.1–15.9)
MCH: 29.9 pg (ref 26.6–33.0)
MCHC: 34.2 g/dL (ref 31.5–35.7)
MCV: 88 fL (ref 79–97)
Platelets: 182 10*3/uL (ref 150–450)
RBC: 3.91 x10E6/uL (ref 3.77–5.28)
RDW: 11.8 % — ABNORMAL LOW (ref 12.3–15.4)
WBC: 10.3 10*3/uL (ref 3.4–10.8)

## 2018-01-30 LAB — GLUCOSE TOLERANCE, 2 HOURS W/ 1HR
GLUCOSE, 2 HOUR: 83 mg/dL (ref 65–152)
Glucose, 1 hour: 119 mg/dL (ref 65–179)
Glucose, Fasting: 74 mg/dL (ref 65–91)

## 2018-01-30 LAB — HIV ANTIBODY (ROUTINE TESTING W REFLEX): HIV Screen 4th Generation wRfx: NONREACTIVE

## 2018-01-30 LAB — RPR: RPR: NONREACTIVE

## 2018-01-31 ENCOUNTER — Other Ambulatory Visit (HOSPITAL_COMMUNITY): Payer: Self-pay | Admitting: *Deleted

## 2018-01-31 ENCOUNTER — Encounter (HOSPITAL_COMMUNITY): Payer: Self-pay

## 2018-01-31 ENCOUNTER — Ambulatory Visit (HOSPITAL_COMMUNITY)
Admission: RE | Admit: 2018-01-31 | Discharge: 2018-01-31 | Disposition: A | Discharge status: Home / Self Care | Payer: Medicaid Other | Source: Ambulatory Visit | Attending: Obstetrics and Gynecology | Admitting: Obstetrics and Gynecology

## 2018-01-31 DIAGNOSIS — O99512 Diseases of the respiratory system complicating pregnancy, second trimester: Secondary | ICD-10-CM | POA: Diagnosis not present

## 2018-01-31 DIAGNOSIS — O99212 Obesity complicating pregnancy, second trimester: Secondary | ICD-10-CM | POA: Diagnosis not present

## 2018-01-31 DIAGNOSIS — Z3A27 27 weeks gestation of pregnancy: Secondary | ICD-10-CM | POA: Diagnosis not present

## 2018-01-31 DIAGNOSIS — O10919 Unspecified pre-existing hypertension complicating pregnancy, unspecified trimester: Secondary | ICD-10-CM

## 2018-01-31 DIAGNOSIS — J45909 Unspecified asthma, uncomplicated: Secondary | ICD-10-CM | POA: Insufficient documentation

## 2018-01-31 DIAGNOSIS — O10012 Pre-existing essential hypertension complicating pregnancy, second trimester: Secondary | ICD-10-CM

## 2018-02-10 ENCOUNTER — Other Ambulatory Visit: Payer: Self-pay

## 2018-02-10 ENCOUNTER — Inpatient Hospital Stay (HOSPITAL_COMMUNITY)
Admission: AD | Admit: 2018-02-10 | Discharge: 2018-02-10 | Disposition: A | Discharge status: Home / Self Care | Payer: Medicaid Other | Source: Ambulatory Visit | Attending: Obstetrics and Gynecology | Admitting: Obstetrics and Gynecology

## 2018-02-10 DIAGNOSIS — O26893 Other specified pregnancy related conditions, third trimester: Secondary | ICD-10-CM | POA: Diagnosis not present

## 2018-02-10 DIAGNOSIS — O99513 Diseases of the respiratory system complicating pregnancy, third trimester: Secondary | ICD-10-CM | POA: Diagnosis not present

## 2018-02-10 DIAGNOSIS — O10013 Pre-existing essential hypertension complicating pregnancy, third trimester: Secondary | ICD-10-CM | POA: Diagnosis not present

## 2018-02-10 DIAGNOSIS — Z3A28 28 weeks gestation of pregnancy: Secondary | ICD-10-CM

## 2018-02-10 DIAGNOSIS — J45909 Unspecified asthma, uncomplicated: Secondary | ICD-10-CM | POA: Insufficient documentation

## 2018-02-10 DIAGNOSIS — Z79899 Other long term (current) drug therapy: Secondary | ICD-10-CM | POA: Insufficient documentation

## 2018-02-10 DIAGNOSIS — R109 Unspecified abdominal pain: Secondary | ICD-10-CM

## 2018-02-10 DIAGNOSIS — Z8249 Family history of ischemic heart disease and other diseases of the circulatory system: Secondary | ICD-10-CM | POA: Insufficient documentation

## 2018-02-10 DIAGNOSIS — Z7982 Long term (current) use of aspirin: Secondary | ICD-10-CM | POA: Diagnosis not present

## 2018-02-10 DIAGNOSIS — A084 Viral intestinal infection, unspecified: Secondary | ICD-10-CM

## 2018-02-10 DIAGNOSIS — M549 Dorsalgia, unspecified: Secondary | ICD-10-CM | POA: Insufficient documentation

## 2018-02-10 DIAGNOSIS — O99613 Diseases of the digestive system complicating pregnancy, third trimester: Secondary | ICD-10-CM | POA: Diagnosis not present

## 2018-02-10 DIAGNOSIS — Z880 Allergy status to penicillin: Secondary | ICD-10-CM | POA: Insufficient documentation

## 2018-02-10 DIAGNOSIS — R1084 Generalized abdominal pain: Secondary | ICD-10-CM | POA: Diagnosis present

## 2018-02-10 LAB — COMPREHENSIVE METABOLIC PANEL
ALBUMIN: 3.1 g/dL — AB (ref 3.5–5.0)
ALK PHOS: 75 U/L (ref 38–126)
ALT: 19 U/L (ref 0–44)
ANION GAP: 9 (ref 5–15)
AST: 21 U/L (ref 15–41)
BUN: 10 mg/dL (ref 6–20)
CALCIUM: 8.9 mg/dL (ref 8.9–10.3)
CHLORIDE: 104 mmol/L (ref 98–111)
CO2: 22 mmol/L (ref 22–32)
Creatinine, Ser: 0.63 mg/dL (ref 0.44–1.00)
GFR calc Af Amer: >60 mL/min (ref >60)
GFR calc non Af Amer: >60 mL/min (ref >60)
GLUCOSE: 74 mg/dL (ref 70–99)
Potassium: 3.8 mmol/L (ref 3.5–5.1)
SODIUM: 135 mmol/L (ref 135–145)
Total Bilirubin: 0.6 mg/dL (ref 0.3–1.2)
Total Protein: 6.3 g/dL — ABNORMAL LOW (ref 6.5–8.1)

## 2018-02-10 LAB — URINALYSIS, ROUTINE W REFLEX MICROSCOPIC
Bacteria, UA: NONE SEEN
Bilirubin Urine: NEGATIVE
Glucose, UA: NEGATIVE mg/dL
Hgb urine dipstick: NEGATIVE
Ketones, ur: NEGATIVE mg/dL
Nitrite: NEGATIVE
Protein, ur: NEGATIVE mg/dL
Specific Gravity, Urine: 1.024 (ref 1.005–1.030)
pH: 6 (ref 5.0–8.0)

## 2018-02-10 LAB — CBC
HCT: 32.4 % — ABNORMAL LOW (ref 36.0–46.0)
Hemoglobin: 11 g/dL — ABNORMAL LOW (ref 12.0–15.0)
MCH: 30.1 pg (ref 26.0–34.0)
MCHC: 34 g/dL (ref 30.0–36.0)
MCV: 88.8 fL (ref 80.0–100.0)
Platelets: 169 10*3/uL (ref 150–400)
RBC: 3.65 MIL/uL — ABNORMAL LOW (ref 3.87–5.11)
RDW: 12.9 % (ref 11.5–15.5)
WBC: 14 10*3/uL — ABNORMAL HIGH (ref 4.0–10.5)
nRBC: 0 % (ref 0.0–0.2)

## 2018-02-10 MED ORDER — LACTATED RINGERS IV BOLUS
1000.0000 mL | Freq: Once | INTRAVENOUS | Status: AC
  Administered 2018-02-10: 1000 mL via INTRAVENOUS

## 2018-02-10 NOTE — MAU Provider Note (Signed)
History     CSN: [id number]  Arrival date and time: 02/10/18 1754   First Provider Initiated Contact with Patient 02/10/18 1832      Chief Complaint  Patient presents with  . Abdominal Pain  . Back Pain   HPI Bailey Carney is a 27 y.o. G1P0000  who presents to Maternity Admissions reporting with diffuse abdominal pain, diarrhea, vomiting, and nausea. She started having abdominal pain on Friday. This pain radiates into her lower back and she describes it as achy. She also has intermittent sharp pains in her lower abdomen. She started having diarrhea on Saturday that has occurred about every 1-2 hours since then. She said her diarrhea is green and has become progressively more watery. She has associated nausea, decreased appetite, and diaphoresis. She denies bloody stool or fever. She vomited twice this morning. She is trying to stay hydrated with water and Gatorade. She does not remember eating any abnormal food and has no sick contacts. She has not taken anything for her nausea or pain.  OB History    Gravida  1   Para  0   Term  0   Preterm  0   AB  0   Living  0     SAB  0   TAB  0   Ectopic  0   Multiple  0   Live Births              Past Medical History:  Diagnosis Date  . Asthma   . Chlamydia   . Cholecystitis   . Genital herpes   . Hypertension    on meds    Past Surgical History:  Procedure Laterality Date  . NO PAST SURGERIES      Family History  Problem Relation Age of Onset  . Hypertension Mother   . Breast cancer Maternal Grandmother   . Hypertension Maternal Grandmother   . Cancer Maternal Grandmother   . Breast cancer Paternal Grandmother   . Cancer Paternal Grandmother   . Cancer Maternal Grandfather     Social History   Tobacco Use  . Smoking status: Never Smoker  . Smokeless tobacco: Never Used  Substance Use Topics  . Alcohol use: Not Currently    Comment: socially prior to preg  . Drug use: No    Allergies:   Allergies  Allergen Reactions  . Amoxicillin Rash and Other (See Comments)    Has patient had a PCN reaction causing immediate rash, facial/tongue/throat swelling, SOB or lightheadedness with hypotension: No Has patient had a PCN reaction causing severe rash involving mucus membranes or skin necrosis: No Has patient had a PCN reaction that required hospitalization No Has patient had a PCN reaction occurring within the last 10 years: No If all of the above answers are "NO", then may proceed with Cephalosporin use.     Medications Prior to Admission  Medication Sig Dispense Refill Last Dose  . albuterol (PROVENTIL HFA;VENTOLIN HFA) 108 (90 Base) MCG/ACT inhaler Inhale 1-2 puffs into the lungs every 6 (six) hours as needed for wheezing or shortness of breath. 1 Inhaler 0 Taking  . aspirin EC 81 MG tablet Take 1 tablet (81 mg total) by mouth daily. Take after 12 weeks for prevention of preeclampsia later in pregnancy 300 tablet 2 Taking  . docusate sodium (COLACE) 100 MG capsule Take 1 capsule (100 mg total) by mouth 2 (two) times daily as needed. (Patient not taking: Reported on 01/13/2018) 30 capsule 2 Not Taking  .  labetalol (NORMODYNE) 100 MG tablet Take 1 tablet (100 mg total) by mouth 2 (two) times daily. 60 tablet 3 Taking  . polyethylene glycol powder (GLYCOLAX/MIRALAX) powder Take 17 g by mouth daily. (Patient not taking: Reported on 01/13/2018) 255 g 0 Not Taking  . Prenatal Vit-Fe Fumarate-FA (PRENATAL PO) Take 1 tablet by mouth at bedtime.   Taking  . terconazole (TERAZOL 7) 0.4 % vaginal cream Place 1 applicator vaginally at bedtime. 45 g 0 Taking    Review of Systems  Constitutional: Negative.  Negative for fatigue and fever.  HENT: Negative.   Respiratory: Negative.  Negative for shortness of breath.   Cardiovascular: Negative.  Negative for chest pain.  Gastrointestinal: Positive for abdominal pain, diarrhea, nausea and vomiting. Negative for constipation.  Genitourinary:  Negative.  Negative for dysuria, vaginal bleeding and vaginal discharge.  Neurological: Negative.  Negative for dizziness and headaches.   Physical Exam   Blood pressure 126/72, pulse 76, temperature 98.4 F (36.9 C), temperature source Oral, resp. rate 18, weight 109.9 kg, last menstrual period 07/14/2017.  Physical Exam  Nursing note and vitals reviewed. Constitutional: She is oriented to person, place, and time. She appears well-developed and well-nourished. No distress.  HENT:  Head: Normocephalic.  Eyes: Pupils are equal, round, and reactive to light.  Cardiovascular: Normal rate, regular rhythm and normal heart sounds.  Respiratory: Effort normal and breath sounds normal. No respiratory distress.  GI: Soft. Bowel sounds are normal. She exhibits no distension. There is no tenderness. There is no rebound and no guarding.  Neurological: She is alert and oriented to person, place, and time.  Skin: Skin is warm and dry.  Psychiatric: She has a normal mood and affect. Her behavior is normal. Judgment and thought content normal.   Dilation: Closed Effacement (%): Thick Cervical Position: Posterior Exam by:: Ma Hillock CNM  Fetal Tracing:  Baseline: 130 Variability: moderate Accels: 10x10 Decels: none  Toco: none  MAU Course  Procedures Results for orders placed or performed during the hospital encounter of 02/10/18 (from the past 24 hour(s))  Urinalysis, Routine w reflex microscopic     Status: Abnormal   Collection Time: 02/10/18  6:13 PM  Result Value Ref Range   Color, Urine YELLOW YELLOW   APPearance CLEAR CLEAR   Specific Gravity, Urine 1.024 1.005 - 1.030   pH 6.0 5.0 - 8.0   Glucose, UA NEGATIVE NEGATIVE mg/dL   Hgb urine dipstick NEGATIVE NEGATIVE   Bilirubin Urine NEGATIVE NEGATIVE   Ketones, ur NEGATIVE NEGATIVE mg/dL   Protein, ur NEGATIVE NEGATIVE mg/dL   Nitrite NEGATIVE NEGATIVE   Leukocytes, UA TRACE (A) NEGATIVE   RBC / HPF 0-5 0 - 5 RBC/hpf   WBC, UA  0-5 0 - 5 WBC/hpf   Bacteria, UA NONE SEEN NONE SEEN   Squamous Epithelial / LPF 0-5 0 - 5   Mucus PRESENT   CBC     Status: Abnormal   Collection Time: 02/10/18  7:34 PM  Result Value Ref Range   WBC 14.0 (H) 4.0 - 10.5 K/uL   RBC 3.65 (L) 3.87 - 5.11 MIL/uL   Hemoglobin 11.0 (L) 12.0 - 15.0 g/dL   HCT 40.9 (L) 81.1 - 91.4 %   MCV 88.8 80.0 - 100.0 fL   MCH 30.1 26.0 - 34.0 pg   MCHC 34.0 30.0 - 36.0 g/dL   RDW 78.2 95.6 - 21.3 %   Platelets 169 150 - 400 K/uL   nRBC 0.0 0.0 - 0.2 %  Comprehensive metabolic panel     Status: Abnormal   Collection Time: 02/10/18  7:34 PM  Result Value Ref Range   Sodium 135 135 - 145 mmol/L   Potassium 3.8 3.5 - 5.1 mmol/L   Chloride 104 98 - 111 mmol/L   CO2 22 22 - 32 mmol/L   Glucose, Bld 74 70 - 99 mg/dL   BUN 10 6 - 20 mg/dL   Creatinine, Ser 1.61 0.44 - 1.00 mg/dL   Calcium 8.9 8.9 - 09.6 mg/dL   Total Protein 6.3 (L) 6.5 - 8.1 g/dL   Albumin 3.1 (L) 3.5 - 5.0 g/dL   AST 21 15 - 41 U/L   ALT 19 0 - 44 U/L   Alkaline Phosphatase 75 38 - 126 U/L   Total Bilirubin 0.6 0.3 - 1.2 mg/dL   GFR calc non Af Amer >60 >60 mL/min   GFR calc Af Amer >60 >60 mL/min   Anion gap 9 5 - 15   MDM UA CBC, CMP LR bolus Stool culture if able  Offered patient nausea medication and patient declined No episodes of diarrhea while in MAU  Assessment and Plan   1. Viral gastroenteritis   2. [redacted] weeks gestation of pregnancy    -Discharge home in stable condition -Food choices to relieve diarrhea reviewed. Encouraged patient to increase fluid intake -Preterm labor precautions discussed -Patient advised to follow-up with [facility] as scheduled for prenatal care -Patient may return to MAU as needed or if her condition were to change or worsen  Rolm Bookbinder CNM 02/10/2018, 7:46 PM

## 2018-02-10 NOTE — Discharge Instructions (Signed)
Food Choices to Help Relieve Diarrhea, Adult When you have diarrhea, the foods you eat and your eating habits are very important. Choosing the right foods and drinks can help:  Relieve diarrhea.  Replace lost fluids and nutrients.  Prevent dehydration.  What general guidelines should I follow? Relieving diarrhea  Choose foods with less than 2 g or .07 oz. of fiber per serving.  Limit fats to less than 8 tsp (38 g or 1.34 oz.) a day.  Avoid the following: ? Foods and beverages sweetened with high-fructose corn syrup, honey, or sugar alcohols such as xylitol, sorbitol, and mannitol. ? Foods that contain a lot of fat or sugar. ? Fried, greasy, or spicy foods. ? High-fiber grains, breads, and cereals. ? Raw fruits and vegetables.  Eat foods that are rich in probiotics. These foods include dairy products such as yogurt and fermented milk products. They help increase healthy bacteria in the stomach and intestines (gastrointestinal tract, or GI tract).  If you have lactose intolerance, avoid dairy products. These may make your diarrhea worse.  Take medicine to help stop diarrhea (antidiarrheal medicine) only as told by your health care provider. Replacing nutrients  Eat small meals or snacks every 3-4 hours.  Eat bland foods, such as white rice, toast, or baked potato, until your diarrhea starts to get better. Gradually reintroduce nutrient-rich foods as tolerated or as told by your health care provider. This includes: ? Well-cooked protein foods. ? Peeled, seeded, and soft-cooked fruits and vegetables. ? Low-fat dairy products.  Take vitamin and mineral supplements as told by your health care provider. Preventing dehydration   Start by sipping water or a special solution to prevent dehydration (oral rehydration solution, ORS). Urine that is clear or pale yellow means that you are getting enough fluid.  Try to drink at least 8-10 cups of fluid each day to help replace lost  fluids.  You may add other liquids in addition to water, such as clear juice or decaffeinated sports drinks, as tolerated or as told by your health care provider.  Avoid drinks with caffeine, such as coffee, tea, or soft drinks.  Avoid alcohol. What foods are recommended? The items listed may not be a complete list. Talk with your health care provider about what dietary choices are best for you. Grains White rice. White, [place], or pita breads (fresh or toasted), including plain rolls, buns, or bagels. White pasta. Saltine, soda, or graham crackers. Pretzels. Low-fiber cereal. Cooked cereals made with water (such as cornmeal, farina, or cream cereals). Plain muffins. Matzo. Melba toast. Zwieback. Vegetables Potatoes (without the skin). Most well-cooked and canned vegetables without skins or seeds. Tender lettuce. Fruits Apple sauce. Fruits canned in juice. Cooked apricots, cherries, grapefruit, peaches, pears, or plums. Fresh bananas and cantaloupe. Meats and other protein foods Baked or boiled chicken. Eggs. Tofu. Fish. Seafood. Smooth nut butters. Ground or well-cooked tender beef, ham, veal, lamb, pork, or poultry. Dairy Plain yogurt, kefir, and unsweetened liquid yogurt. Lactose-free milk, buttermilk, skim milk, or soy milk. Low-fat or nonfat hard cheese. Beverages Water. Low-calorie sports drinks. Fruit juices without pulp. Strained tomato and vegetable juices. Decaffeinated teas. Sugar-free beverages not sweetened with sugar alcohols. Oral rehydration solutions, if approved by your health care provider. Seasoning and other foods Bouillon, broth, or soups made from recommended foods. What foods are not recommended? The items listed may not be a complete list. Talk with your health care provider about what dietary choices are best for you. Grains Whole grain, whole wheat,  bran, or rye breads, rolls, pastas, and crackers. Wild or brown rice. Whole grain or bran cereals. Barley. Oats and  oatmeal. Corn tortillas or taco shells. Granola. Popcorn. Vegetables Raw vegetables. Fried vegetables. Cabbage, broccoli, Brussels sprouts, artichokes, baked beans, beet greens, corn, kale, legumes, peas, sweet potatoes, and yams. Potato skins. Cooked spinach and cabbage. Fruits Dried fruit, including raisins and dates. Raw fruits. Stewed or dried prunes. Canned fruits with syrup. Meat and other protein foods Fried or fatty meats. Deli meats. Chunky nut butters. Nuts and seeds. Beans and lentils. Tomasa BlaseBacon. Hot dogs. Sausage. Dairy High-fat cheeses. Whole milk, chocolate milk, and beverages made with milk, such as milk shakes. Half-and-half. Cream. sour cream. Ice cream. Beverages Caffeinated beverages (such as coffee, tea, soda, or energy drinks). Alcoholic beverages. Fruit juices with pulp. Prune juice. Soft drinks sweetened with high-fructose corn syrup or sugar alcohols. High-calorie sports drinks. Fats and oils Butter. Cream sauces. Margarine. Salad oils. Plain salad dressings. Olives. Avocados. Mayonnaise. Sweets and desserts Sweet rolls, doughnuts, and sweet breads. Sugar-free desserts sweetened with sugar alcohols such as xylitol and sorbitol. Seasoning and other foods Honey. Hot sauce. Chili powder. Gravy. Cream-based or milk-based soups. Pancakes and waffles. Summary  When you have diarrhea, the foods you eat and your eating habits are very important.  Make sure you get at least 8-10 cups of fluid each day, or enough to keep your urine clear or pale yellow.  Eat bland foods and gradually reintroduce healthy, nutrient-rich foods as tolerated, or as told by your health care provider.  Avoid high-fiber, fried, greasy, or spicy foods. This information is not intended to replace advice given to you by your health care provider. Make sure you discuss any questions you have with your health care provider. Document Released: 06/23/2003 Document Revised: 03/30/2016 Document Reviewed:  03/30/2016 Elsevier Interactive Patient Education  2018 [employer]  Safe Medications in Pregnancy   Acne: Benzoyl Peroxide Salicylic Acid  Backache/Headache: Tylenol: 2 regular strength every 4 hours OR              2 Extra strength every 6 hours  Colds/Coughs/Allergies: Benadryl (alcohol free) 25 mg every 6 hours as needed Breath right strips Claritin Cepacol throat lozenges Chloraseptic throat spray Cold-Eeze- up to three times per day Cough drops, alcohol free Flonase (by prescription only) Guaifenesin Mucinex Robitussin DM (plain only, alcohol free) Saline nasal spray/drops Sudafed (pseudoephedrine) & Actifed ** use only after [redacted] weeks gestation and if you do not have high blood pressure Tylenol Vicks Vaporub Zinc lozenges Zyrtec   Constipation: Colace Ducolax suppositories Fleet enema Glycerin suppositories Metamucil Milk of magnesia Miralax Senokot Smooth move tea  Diarrhea: Kaopectate Imodium A-D  *NO pepto Bismol  Hemorrhoids: Anusol Anusol HC Preparation H Tucks  Indigestion: Tums Maalox Mylanta Zantac  Pepcid  Insomnia: Benadryl (alcohol free) 25mg  every 6 hours as needed Tylenol PM Unisom, no Gelcaps  Leg Cramps: Tums MagGel  Nausea/Vomiting:  Bonine Dramamine Emetrol Ginger extract Sea bands Meclizine  Nausea medication to take during pregnancy:  Unisom (doxylamine succinate 25 mg tablets) Take one tablet daily at bedtime. If symptoms are not adequately controlled, the dose can be increased to a maximum recommended dose of two tablets daily (1/2 tablet in the morning, 1/2 tablet mid-afternoon and one at bedtime). Vitamin B6 100mg  tablets. Take one tablet twice a day (up to 200 mg per day).  Skin Rashes: Aveeno products Benadryl cream or 25mg  every 6 hours as needed Calamine Lotion 1% cortisone cream  Yeast infection:  two tablets daily (1/2 tablet in the morning, 1/2 tablet mid-afternoon and one at bedtime).  °Vitamin B6 100mg tablets. Take one tablet twice a day (up to 200 mg per day).  ° °Skin Rashes:  °Aveeno products  °Benadryl cream or 25mg every 6 hours as needed  °Calamine Lotion  °1% cortisone cream  ° °Yeast infection:  °Gyne-lotrimin 7  °Monistat 7   ° ° °**If taking multiple medications, please check labels to avoid duplicating the same active ingredients  °**take medication as directed on the label  °** Do not exceed 4000 mg of tylenol in 24 hours  °**Do not take medications that contain aspirin or ibuprofen  °    °   ° ° ° °

## 2018-02-10 NOTE — MAU Note (Signed)
Pt is a G1P0 at 28.3 weeks c/o abdominal pain since Friday with lose and water stool since Saturday night.  Pt reports no other OB or medical problems.

## 2018-02-10 NOTE — MAU Note (Addendum)
Having pain in stomach, lower back and side.  Been going on since Friday. Right now is achy and then will get sharp pains.no bleeding or leaking. Started having diarrhea on Saturday, is now green and watery, has been 'going all day'.  Threw up this morning.

## 2018-02-14 ENCOUNTER — Ambulatory Visit (INDEPENDENT_AMBULATORY_CARE_PROVIDER_SITE_OTHER): Payer: Medicaid Other | Admitting: Obstetrics & Gynecology

## 2018-02-14 ENCOUNTER — Encounter (HOSPITAL_COMMUNITY): Payer: Self-pay | Admitting: *Deleted

## 2018-02-14 ENCOUNTER — Inpatient Hospital Stay (HOSPITAL_COMMUNITY)
Admission: AD | Admit: 2018-02-14 | Discharge: 2018-02-14 | Disposition: A | Discharge status: Home / Self Care | Payer: Medicaid Other | Source: Ambulatory Visit | Attending: Obstetrics & Gynecology | Admitting: Obstetrics & Gynecology

## 2018-02-14 DIAGNOSIS — Z3689 Encounter for other specified antenatal screening: Secondary | ICD-10-CM

## 2018-02-14 DIAGNOSIS — W19XXXA Unspecified fall, initial encounter: Secondary | ICD-10-CM | POA: Insufficient documentation

## 2018-02-14 DIAGNOSIS — Z3493 Encounter for supervision of normal pregnancy, unspecified, third trimester: Secondary | ICD-10-CM | POA: Diagnosis present

## 2018-02-14 DIAGNOSIS — Z3A29 29 weeks gestation of pregnancy: Secondary | ICD-10-CM | POA: Diagnosis not present

## 2018-02-14 DIAGNOSIS — O0990 Supervision of high risk pregnancy, unspecified, unspecified trimester: Secondary | ICD-10-CM

## 2018-02-14 DIAGNOSIS — O26893 Other specified pregnancy related conditions, third trimester: Secondary | ICD-10-CM | POA: Diagnosis not present

## 2018-02-14 LAB — URINALYSIS, ROUTINE W REFLEX MICROSCOPIC
Bacteria, UA: NONE SEEN
Bilirubin Urine: NEGATIVE
Glucose, UA: NEGATIVE mg/dL
HGB URINE DIPSTICK: NEGATIVE
Ketones, ur: NEGATIVE mg/dL
NITRITE: NEGATIVE
PH: 6 (ref 5.0–8.0)
Protein, ur: NEGATIVE mg/dL
SPECIFIC GRAVITY, URINE: 1.015 (ref 1.005–1.030)

## 2018-02-14 MED ORDER — ACETAMINOPHEN 500 MG PO TABS
1000.0000 mg | ORAL_TABLET | Freq: Once | ORAL | Status: AC
  Administered 2018-02-14: 1000 mg via ORAL
  Filled 2018-02-14: qty 2

## 2018-02-14 NOTE — MAU Note (Signed)
Around 11:10, patient reports being at home and walking down the stairs when she missed the third step and fell down about 2 steps onto her knees.  She has has an abrasion on left knee and states her whole knee is sore.  Also reports lower back feeling a constant ache and rates the pain 5/10.  Denies taking anything for the pain.

## 2018-02-14 NOTE — Discharge Instructions (Signed)

## 2018-02-14 NOTE — Patient Instructions (Signed)
Your weight at your initial prenatal visit was 231 lb and 4.8 oz.   Keep up the good work with daily aspirin and labetalol. We will see you back for your next visit.

## 2018-02-14 NOTE — MAU Provider Note (Signed)
History     CSN: [id number]  Arrival date and time: 02/14/18 1249   First Provider Initiated Contact with Patient 02/14/18 1334      Chief Complaint  Patient presents with  . Fall   G1 @29 .0 wks here after a fall. Reports slipping on 2 steps and landed on knees. Incident happened around 11 am. Denies abdominal or head contact. No LOC. Denies VB, LOF, or ctx. Reports good FM. Denies injury except left knee.    OB History    Gravida  1   Para  0   Term  0   Preterm  0   AB  0   Living  0     SAB  0   TAB  0   Ectopic  0   Multiple  0   Live Births  0           Past Medical History:  Diagnosis Date  . Asthma   . Chlamydia   . Cholecystitis   . Genital herpes   . Hypertension    on meds    Past Surgical History:  Procedure Laterality Date  . NO PAST SURGERIES      Family History  Problem Relation Age of Onset  . Hypertension Mother   . Breast cancer Maternal Grandmother   . Hypertension Maternal Grandmother   . Cancer Maternal Grandmother   . Breast cancer Paternal Grandmother   . Cancer Paternal Grandmother   . Cancer Maternal Grandfather     Social History   Tobacco Use  . Smoking status: Never Smoker  . Smokeless tobacco: Never Used  Substance Use Topics  . Alcohol use: Not Currently    Comment: socially prior to preg  . Drug use: No    Allergies:  Allergies  Allergen Reactions  . Amoxicillin Rash and Other (See Comments)    Has patient had a PCN reaction causing immediate rash, facial/tongue/throat swelling, SOB or lightheadedness with hypotension: No Has patient had a PCN reaction causing severe rash involving mucus membranes or skin necrosis: No Has patient had a PCN reaction that required hospitalization No Has patient had a PCN reaction occurring within the last 10 years: No If all of the above answers are "NO", then may proceed with Cephalosporin use.     Medications Prior to Admission  Medication Sig Dispense Refill  Last Dose  . albuterol (PROVENTIL HFA;VENTOLIN HFA) 108 (90 Base) MCG/ACT inhaler Inhale 1-2 puffs into the lungs every 6 (six) hours as needed for wheezing or shortness of breath. 1 Inhaler 0 Taking  . aspirin EC 81 MG tablet Take 1 tablet (81 mg total) by mouth daily. Take after 12 weeks for prevention of preeclampsia later in pregnancy 300 tablet 2 Taking  . docusate sodium (COLACE) 100 MG capsule Take 1 capsule (100 mg total) by mouth 2 (two) times daily as needed. (Patient not taking: Reported on 01/13/2018) 30 capsule 2 Not Taking  . labetalol (NORMODYNE) 100 MG tablet Take 1 tablet (100 mg total) by mouth 2 (two) times daily. 60 tablet 3 Taking  . polyethylene glycol powder (GLYCOLAX/MIRALAX) powder Take 17 g by mouth daily. (Patient not taking: Reported on 01/13/2018) 255 g 0 Not Taking  . Prenatal Vit-Fe Fumarate-FA (PRENATAL PO) Take 1 tablet by mouth at bedtime.   Taking  . terconazole (TERAZOL 7) 0.4 % vaginal cream Place 1 applicator vaginally at bedtime. (Patient not taking: Reported on 02/14/2018) 45 g 0 Not Taking    Review of Systems  Gastrointestinal: Negative for abdominal pain.  Genitourinary: Negative for vaginal bleeding and vaginal discharge.  Skin: Positive for wound.   Physical Exam   Blood pressure 125/73, pulse 89, temperature 97.8 F (36.6 C), temperature source Oral, resp. rate 16, weight 113.4 kg, last menstrual period 07/14/2017, SpO2 100 %.  Physical Exam  Constitutional: She is oriented to person, place, and time. She appears well-developed and well-nourished. No distress.  HENT:  Head: Normocephalic and atraumatic.  Neck: Normal range of motion.  Cardiovascular: Normal rate.  Respiratory: Effort normal. No respiratory distress.  GI: Soft. She exhibits no distension. There is no tenderness.  gravid  Musculoskeletal: Normal range of motion.  Neurological: She is alert and oriented to person, place, and time.  Skin: Skin is warm and dry.  Small abrasion to  left knee  Psychiatric: She has a normal mood and affect.  EFM: 140 bpm, mod variability, + accels, no decels Toco: none  Results for orders placed or performed during the hospital encounter of 02/14/18 (from the past 24 hour(s))  Urinalysis, Routine w reflex microscopic     Status: Abnormal   Collection Time: 02/14/18  1:16 PM  Result Value Ref Range   Color, Urine YELLOW YELLOW   APPearance CLEAR CLEAR   Specific Gravity, Urine 1.015 1.005 - 1.030   pH 6.0 5.0 - 8.0   Glucose, UA NEGATIVE NEGATIVE mg/dL   Hgb urine dipstick NEGATIVE NEGATIVE   Bilirubin Urine NEGATIVE NEGATIVE   Ketones, ur NEGATIVE NEGATIVE mg/dL   Protein, ur NEGATIVE NEGATIVE mg/dL   Nitrite NEGATIVE NEGATIVE   Leukocytes, UA TRACE (A) NEGATIVE   RBC / HPF 0-5 0 - 5 RBC/hpf   WBC, UA 0-5 0 - 5 WBC/hpf   Bacteria, UA NONE SEEN NONE SEEN   Squamous Epithelial / LPF 0-5 0 - 5   Mucus PRESENT    MAU Course  Procedures Prolonged EFM x4 hrs MDM No evidence of abruption, FHT reassuring. Stable for discharge home.  Assessment and Plan   1. [redacted] weeks gestation of pregnancy   2. NST (non-stress test) reactive   3. Fall, initial encounter    Discharge home Follow up in OB office as scheduled [facility] Abruption precautions  Allergies as of 02/14/2018      Reactions   Amoxicillin Rash, Other (See Comments)   Has patient had a PCN reaction causing immediate rash, facial/tongue/throat swelling, SOB or lightheadedness with hypotension: No Has patient had a PCN reaction causing severe rash involving mucus membranes or skin necrosis: No Has patient had a PCN reaction that required hospitalization No Has patient had a PCN reaction occurring within the last 10 years: No If all of the above answers are "NO", then may proceed with Cephalosporin use.      Medication List    STOP taking these medications   docusate sodium 100 MG capsule Commonly known as:  COLACE   polyethylene glycol powder powder Commonly known  as:  GLYCOLAX/MIRALAX   terconazole 0.4 % vaginal cream Commonly known as:  TERAZOL 7     TAKE these medications   albuterol 108 (90 Base) MCG/ACT inhaler Commonly known as:  PROVENTIL HFA;VENTOLIN HFA Inhale 1-2 puffs into the lungs every 6 (six) hours as needed for wheezing or shortness of breath.   aspirin EC 81 MG tablet Take 1 tablet (81 mg total) by mouth daily. Take after 12 weeks for prevention of preeclampsia later in pregnancy   labetalol 100 MG tablet Commonly known as:  NORMODYNE Take  1 tablet (100 mg total) by mouth 2 (two) times daily.   PRENATAL PO Take 1 tablet by mouth at bedtime.      Donette Larry, CNM 02/14/2018, 2:11 PM

## 2018-02-14 NOTE — Progress Notes (Signed)
   PRENATAL VISIT NOTE  Subjective:  Bailey Carney is a 27 y.o. G1P0000 at [redacted]w[redacted]d being seen today for ongoing prenatal care.  She is currently monitored for the following issues for this high-risk pregnancy and has Asthma; Hypertension; Chronic hypertension during pregnancy, antepartum; Supervision of high risk pregnancy, antepartum; History of ELISA positive for HSV; and Acute cholecystitis on their problem list.  Patient reports no complaints.  Contractions: Not present. Vag. Bleeding: None.  Movement: Present. Denies leaking of fluid.   The following portions of the patient's history were reviewed and updated as appropriate: allergies, current medications, past family history, past medical history, past social history, past surgical history and problem list. Problem list updated.  Objective:   Vitals:   02/14/18 0931  BP: 133/68  Pulse: 87  Weight: 242 lb 12.8 oz (110.1 kg)    Fetal Status: Fetal Heart Rate (bpm): 141   Movement: Present     General:  Alert, oriented and cooperative. Patient is in no acute distress.  Skin: Skin is warm and dry. No rash noted.   Cardiovascular: Normal heart rate noted  Respiratory: Normal respiratory effort, no problems with respiration noted  Abdomen: Soft, gravid, appropriate for gestational age.  Pain/Pressure: Absent     Pelvic: Cervical exam deferred        Extremities: Normal range of motion.  Edema: Trace  Mental Status: Normal mood and affect. Normal behavior. Normal judgment and thought content.   Assessment and Plan:  Pregnancy: G1P0000 at [redacted]w[redacted]d  1. Supervision of high risk pregnancy, antepartum 2. HTN in pregnancy-  - MFM u/s scheduled - start testing at 32 weeks - baby asa daily  Preterm labor symptoms and general obstetric precautions including but not limited to vaginal bleeding, contractions, leaking of fluid and fetal movement were reviewed in detail with the patient. Please refer to After Visit Summary for other counseling  recommendations.  Return in about 2 weeks (around 02/28/2018).  Future Appointments  Date Time Provider Department Center  02/28/2018  8:30 AM [facility] [place] 1 [facility]  03/07/2018  8:30 AM [facility] [place] 1 [facility]-US    Allie Bossier, MD

## 2018-02-14 NOTE — Progress Notes (Signed)
Pt declined speaking with integrated behavioral health clinician.  

## 2018-02-27 ENCOUNTER — Encounter: Payer: Medicaid Other | Admitting: Advanced Practice Midwife

## 2018-02-28 ENCOUNTER — Other Ambulatory Visit (HOSPITAL_COMMUNITY): Payer: Self-pay | Admitting: *Deleted

## 2018-02-28 ENCOUNTER — Encounter (HOSPITAL_COMMUNITY): Payer: Self-pay

## 2018-02-28 ENCOUNTER — Ambulatory Visit (HOSPITAL_COMMUNITY)
Admission: RE | Admit: 2018-02-28 | Discharge: 2018-02-28 | Disposition: A | Discharge status: Home / Self Care | Payer: Medicaid Other | Source: Ambulatory Visit | Attending: Obstetrics and Gynecology | Admitting: Obstetrics and Gynecology

## 2018-02-28 DIAGNOSIS — O09212 Supervision of pregnancy with history of pre-term labor, second trimester: Secondary | ICD-10-CM

## 2018-02-28 DIAGNOSIS — O9989 Other specified diseases and conditions complicating pregnancy, childbirth and the puerperium: Secondary | ICD-10-CM

## 2018-02-28 DIAGNOSIS — O10913 Unspecified pre-existing hypertension complicating pregnancy, third trimester: Secondary | ICD-10-CM

## 2018-02-28 DIAGNOSIS — O10919 Unspecified pre-existing hypertension complicating pregnancy, unspecified trimester: Secondary | ICD-10-CM

## 2018-02-28 DIAGNOSIS — Z3A31 31 weeks gestation of pregnancy: Secondary | ICD-10-CM | POA: Diagnosis not present

## 2018-02-28 DIAGNOSIS — O10013 Pre-existing essential hypertension complicating pregnancy, third trimester: Secondary | ICD-10-CM | POA: Diagnosis not present

## 2018-03-07 ENCOUNTER — Ambulatory Visit (HOSPITAL_COMMUNITY): Payer: Medicaid Other

## 2018-03-08 ENCOUNTER — Encounter (HOSPITAL_COMMUNITY): Payer: Self-pay | Admitting: *Deleted

## 2018-03-08 ENCOUNTER — Inpatient Hospital Stay (HOSPITAL_COMMUNITY)
Admission: AD | Admit: 2018-03-08 | Discharge: 2018-03-09 | Disposition: A | Discharge status: Home / Self Care | Payer: Medicaid Other | Source: Ambulatory Visit | Attending: Obstetrics and Gynecology | Admitting: Obstetrics and Gynecology

## 2018-03-08 DIAGNOSIS — Z7982 Long term (current) use of aspirin: Secondary | ICD-10-CM | POA: Diagnosis not present

## 2018-03-08 DIAGNOSIS — Z79899 Other long term (current) drug therapy: Secondary | ICD-10-CM | POA: Insufficient documentation

## 2018-03-08 DIAGNOSIS — Z3A32 32 weeks gestation of pregnancy: Secondary | ICD-10-CM | POA: Diagnosis not present

## 2018-03-08 DIAGNOSIS — O4703 False labor before 37 completed weeks of gestation, third trimester: Secondary | ICD-10-CM | POA: Diagnosis not present

## 2018-03-08 DIAGNOSIS — O26899 Other specified pregnancy related conditions, unspecified trimester: Secondary | ICD-10-CM

## 2018-03-08 DIAGNOSIS — O298X3 Other complications of anesthesia during pregnancy, third trimester: Secondary | ICD-10-CM | POA: Diagnosis present

## 2018-03-08 DIAGNOSIS — O479 False labor, unspecified: Secondary | ICD-10-CM

## 2018-03-08 DIAGNOSIS — Z880 Allergy status to penicillin: Secondary | ICD-10-CM | POA: Diagnosis not present

## 2018-03-08 DIAGNOSIS — R1030 Lower abdominal pain, unspecified: Secondary | ICD-10-CM | POA: Diagnosis present

## 2018-03-08 DIAGNOSIS — R102 Pelvic and perineal pain: Secondary | ICD-10-CM

## 2018-03-08 LAB — URINALYSIS, ROUTINE W REFLEX MICROSCOPIC
BILIRUBIN URINE: NEGATIVE
Glucose, UA: NEGATIVE mg/dL
HGB URINE DIPSTICK: NEGATIVE
Ketones, ur: NEGATIVE mg/dL
NITRITE: NEGATIVE
PROTEIN: NEGATIVE mg/dL
Specific Gravity, Urine: 1.024 (ref 1.005–1.030)
pH: 5 (ref 5.0–8.0)

## 2018-03-08 NOTE — Discharge Instructions (Signed)
Safe Medications in Pregnancy   Acne: Benzoyl Peroxide Salicylic Acid  Backache/Headache: Tylenol: 2 regular strength every 4 hours OR              2 Extra strength every 6 hours  Colds/Coughs/Allergies: Benadryl (alcohol free) 25 mg every 6 hours as needed Breath right strips Claritin Cepacol throat lozenges Chloraseptic throat spray Cold-Eeze- up to three times per day Cough drops, alcohol free Flonase (by prescription only) Guaifenesin Mucinex Robitussin DM (plain only, alcohol free) Saline nasal spray/drops Sudafed (pseudoephedrine) & Actifed ** use only after [redacted] weeks gestation and if you do not have high blood pressure Tylenol Vicks Vaporub Zinc lozenges Zyrtec   Constipation: Colace Ducolax suppositories Fleet enema Glycerin suppositories Metamucil Milk of magnesia Miralax Senokot Smooth move tea  Diarrhea: Kaopectate Imodium A-D  *NO pepto Bismol  Hemorrhoids: Anusol Anusol HC Preparation H Tucks  Indigestion: Tums Maalox Mylanta Zantac  Pepcid  Insomnia: Benadryl (alcohol free) 25mg  every 6 hours as needed Tylenol PM Unisom, no Gelcaps  Leg Cramps: Tums MagGel  Nausea/Vomiting:  Bonine Dramamine Emetrol Ginger extract Sea bands Meclizine  Nausea medication to take during pregnancy:  Unisom (doxylamine succinate 25 mg tablets) Take one tablet daily at bedtime. If symptoms are not adequately controlled, the dose can be increased to a maximum recommended dose of two tablets daily (1/2 tablet in the morning, 1/2 tablet mid-afternoon and one at bedtime). Vitamin B6 100mg  tablets. Take one tablet twice a day (up to 200 mg per day).  Skin Rashes: Aveeno products Benadryl cream or 25mg  every 6 hours as needed Calamine Lotion 1% cortisone cream  Yeast infection: Gyne-lotrimin 7 Monistat 7   **If taking multiple medications, please check labels to avoid duplicating the same active ingredients **take medication as directed on  the label ** Do not exceed 4000 mg of tylenol in 24 hours **Do not take medications that contain aspirin or ibuprofen    Round Ligament Pain The round ligament is a cord of muscle and tissue that helps to support the uterus. It can become a source of pain during pregnancy if it becomes stretched or twisted as the baby grows. The pain usually begins in the second trimester of pregnancy, and it can come and go until the baby is delivered. It is not a serious problem, and it does not cause harm to the baby. Round ligament pain is usually a short, sharp, and pinching pain, but it can also be a dull, lingering, and aching pain. The pain is felt in the lower side of the abdomen or in the groin. It usually starts deep in the groin and moves up to the outside of the hip area. Pain can occur with:  A sudden change in position.  Rolling over in bed.  Coughing or sneezing.  Physical activity.  Follow these instructions at home: Watch your condition for any changes. Take these steps to help with your pain:  When the pain starts, relax. Then try: ? Sitting down. ? Flexing your knees up to your abdomen. ? Lying on your side with one pillow under your abdomen and another pillow between your legs. ? Sitting in a warm bath for 15-20 minutes or until the pain goes away.  Take over-the-counter and prescription medicines only as told by your health care provider.  Move slowly when you sit and stand.  Avoid long walks if they cause pain.  Stop or lessen your physical activities if they cause pain.  Contact a health care provider if:  Your pain does not go away with treatment. °· You feel pain in your back that you did not have before. °· Your medicine is not helping. °Get help right away if: °· You develop a fever or chills. °· You develop uterine contractions. °· You develop vaginal bleeding. °· You develop nausea or vomiting. °· You develop diarrhea. °· You have pain when you urinate. °This  information is not intended to replace advice given to you by your health care provider. Make sure you discuss any questions you have with your health care provider. °Document Released: 01/10/2008 Document Revised: 09/08/2015 Document Reviewed: 06/09/2014 °Elsevier Interactive Patient Education © 2018 Elsevier Inc. ° °

## 2018-03-08 NOTE — MAU Note (Signed)
Lower abd pain for a wk. Sometimes feels like stomach is tightening. Sharp pains in "my privates". Denies vag d/c, bleeding, or LOF

## 2018-03-08 NOTE — MAU Provider Note (Signed)
History     CSN: [id number]  Arrival date and time: 03/08/18 2243   First Provider Initiated Contact with Patient 03/08/18 2322      Chief Complaint  Patient presents with  . Abdominal Pain   HPI Bailey Carney is a 27 y.o. G1P0000 at 6957w1d who presents with lower abdominal pain and tightening. She states it started this afternoon. She reports tightening and is unsure how often as well as sharp shooting pains with movement in her lower abdomen. She rates the pain a 5/10 and has not tried anything for the pain. She denies any leaking or bleeding. Reports normal fetal movement. She states she has had one bottle of water total today.   She has CHTN and does not have any headaches, visual changes or epigastric pain.  OB History    Gravida  1   Para  0   Term  0   Preterm  0   AB  0   Living  0     SAB  0   TAB  0   Ectopic  0   Multiple  0   Live Births  0           Past Medical History:  Diagnosis Date  . Asthma   . Chlamydia   . Cholecystitis   . Genital herpes   . Hypertension    on meds    Past Surgical History:  Procedure Laterality Date  . NO PAST SURGERIES      Family History  Problem Relation Age of Onset  . Hypertension Mother   . Breast cancer Maternal Grandmother   . Hypertension Maternal Grandmother   . Cancer Maternal Grandmother   . Breast cancer Paternal Grandmother   . Cancer Paternal Grandmother   . Cancer Maternal Grandfather     Social History   Tobacco Use  . Smoking status: Never Smoker  . Smokeless tobacco: Never Used  Substance Use Topics  . Alcohol use: Not Currently    Comment: socially prior to preg  . Drug use: No    Allergies:  Allergies  Allergen Reactions  . Amoxicillin Rash and Other (See Comments)    Has patient had a PCN reaction causing immediate rash, facial/tongue/throat swelling, SOB or lightheadedness with hypotension: No Has patient had a PCN reaction causing severe rash involving mucus  membranes or skin necrosis: No Has patient had a PCN reaction that required hospitalization No Has patient had a PCN reaction occurring within the last 10 years: No If all of the above answers are "NO", then may proceed with Cephalosporin use.     Medications Prior to Admission  Medication Sig Dispense Refill Last Dose  . albuterol (PROVENTIL HFA;VENTOLIN HFA) 108 (90 Base) MCG/ACT inhaler Inhale 1-2 puffs into the lungs every 6 (six) hours as needed for wheezing or shortness of breath. 1 Inhaler 0 Taking  . aspirin EC 81 MG tablet Take 1 tablet (81 mg total) by mouth daily. Take after 12 weeks for prevention of preeclampsia later in pregnancy 300 tablet 2 Taking  . labetalol (NORMODYNE) 100 MG tablet Take 1 tablet (100 mg total) by mouth 2 (two) times daily. 60 tablet 3 Taking  . Prenatal Vit-Fe Fumarate-FA (PRENATAL PO) Take 1 tablet by mouth at bedtime.   Taking    Review of Systems  Constitutional: Negative.  Negative for fatigue and fever.  HENT: Negative.   Respiratory: Negative.  Negative for shortness of breath.   Cardiovascular: Negative.  Negative for  chest pain.  Gastrointestinal: Positive for abdominal pain. Negative for constipation, diarrhea, nausea and vomiting.  Genitourinary: Positive for vaginal pain. Negative for dysuria, vaginal bleeding and vaginal discharge.  Neurological: Negative.  Negative for dizziness and headaches.   Physical Exam   Blood pressure 122/77, pulse (!) 101, temperature 98.1 F (36.7 C), resp. rate 20, height 5\' 7"  (1.702 m), weight 110.7 kg, last menstrual period 07/14/2017.  Physical Exam  Nursing note and vitals reviewed. Constitutional: She is oriented to person, place, and time. She appears well-developed and well-nourished. No distress.  HENT:  Head: Normocephalic.  Eyes: Pupils are equal, round, and reactive to light.  Cardiovascular: Normal rate, regular rhythm and normal heart sounds.  Respiratory: Effort normal and breath sounds  normal. No respiratory distress.  GI: Soft. Bowel sounds are normal. She exhibits no distension and no mass. There is no tenderness. There is no rebound and no guarding.  Neurological: She is alert and oriented to person, place, and time.  Skin: Skin is warm and dry.  Psychiatric: She has a normal mood and affect. Her behavior is normal. Judgment and thought content normal.    Dilation: Closed Effacement (%): Thick Cervical Position: Posterior Exam by:: C.Neil,CNM  Fetal Tracing:  Baseline: 130 Variability: moderate Accels: 15x15 Decels: none  Toco: none  MAU Course  Procedures Results for orders placed or performed during the hospital encounter of 03/08/18 (from the past 24 hour(s))  Urinalysis, Routine w reflex microscopic     Status: Abnormal   Collection Time: 03/08/18 11:26 PM  Result Value Ref Range   Color, Urine YELLOW YELLOW   APPearance HAZY (A) CLEAR   Specific Gravity, Urine 1.024 1.005 - 1.030   pH 5.0 5.0 - 8.0   Glucose, UA NEGATIVE NEGATIVE mg/dL   Hgb urine dipstick NEGATIVE NEGATIVE   Bilirubin Urine NEGATIVE NEGATIVE   Ketones, ur NEGATIVE NEGATIVE mg/dL   Protein, ur NEGATIVE NEGATIVE mg/dL   Nitrite NEGATIVE NEGATIVE   Leukocytes, UA TRACE (A) NEGATIVE   RBC / HPF 0-5 0 - 5 RBC/hpf   WBC, UA 0-5 0 - 5 WBC/hpf   Bacteria, UA RARE (A) NONE SEEN   Squamous Epithelial / LPF 6-10 0 - 5   Mucus PRESENT    MDM UA PO hydration No pain or contractions while in MAU.  Discussed with patient importance of PO hydration in pregnancy to prevent contractions  Assessment and Plan   1. Braxton Hick's contraction   2. Pain of round ligament during pregnancy   3. [redacted] weeks gestation of pregnancy    -Discharge home in stable condition -Preterm labor precautions discussed -Patient advised to follow-[facility] with [facility] as scheduled for prenatal care -Patient may return to MAU as needed or if her condition were to change or worsen   Rolm Bookbinder CNM 03/08/2018,  11:22 PM

## 2018-03-12 ENCOUNTER — Ambulatory Visit (HOSPITAL_COMMUNITY)
Admission: RE | Admit: 2018-03-12 | Discharge: 2018-03-12 | Disposition: A | Discharge status: Home / Self Care | Payer: Medicaid Other | Source: Ambulatory Visit | Attending: Obstetrics and Gynecology | Admitting: Obstetrics and Gynecology

## 2018-03-12 ENCOUNTER — Encounter (HOSPITAL_COMMUNITY): Payer: Self-pay

## 2018-03-12 DIAGNOSIS — O99213 Obesity complicating pregnancy, third trimester: Secondary | ICD-10-CM

## 2018-03-12 DIAGNOSIS — Z3A32 32 weeks gestation of pregnancy: Secondary | ICD-10-CM | POA: Insufficient documentation

## 2018-03-12 DIAGNOSIS — O10913 Unspecified pre-existing hypertension complicating pregnancy, third trimester: Secondary | ICD-10-CM | POA: Insufficient documentation

## 2018-03-12 DIAGNOSIS — O10013 Pre-existing essential hypertension complicating pregnancy, third trimester: Secondary | ICD-10-CM

## 2018-03-20 ENCOUNTER — Encounter (HOSPITAL_COMMUNITY): Payer: Self-pay

## 2018-03-20 ENCOUNTER — Ambulatory Visit (HOSPITAL_COMMUNITY)
Admission: RE | Admit: 2018-03-20 | Discharge: 2018-03-20 | Disposition: A | Discharge status: Home / Self Care | Payer: Medicaid Other | Source: Ambulatory Visit | Attending: Obstetrics and Gynecology | Admitting: Obstetrics and Gynecology

## 2018-03-20 ENCOUNTER — Ambulatory Visit (INDEPENDENT_AMBULATORY_CARE_PROVIDER_SITE_OTHER): Payer: Medicaid Other | Admitting: Obstetrics & Gynecology

## 2018-03-20 VITALS — BP 123/74 | HR 90 | Wt 248.5 lb

## 2018-03-20 VITALS — BP 120/87 | HR 99 | Wt 249.2 lb

## 2018-03-20 DIAGNOSIS — Z8619 Personal history of other infectious and parasitic diseases: Secondary | ICD-10-CM

## 2018-03-20 DIAGNOSIS — Z3A33 33 weeks gestation of pregnancy: Secondary | ICD-10-CM

## 2018-03-20 DIAGNOSIS — O0990 Supervision of high risk pregnancy, unspecified, unspecified trimester: Secondary | ICD-10-CM

## 2018-03-20 DIAGNOSIS — O10913 Unspecified pre-existing hypertension complicating pregnancy, third trimester: Secondary | ICD-10-CM

## 2018-03-20 DIAGNOSIS — O0993 Supervision of high risk pregnancy, unspecified, third trimester: Secondary | ICD-10-CM

## 2018-03-20 DIAGNOSIS — O10919 Unspecified pre-existing hypertension complicating pregnancy, unspecified trimester: Secondary | ICD-10-CM

## 2018-03-20 DIAGNOSIS — O10013 Pre-existing essential hypertension complicating pregnancy, third trimester: Secondary | ICD-10-CM | POA: Insufficient documentation

## 2018-03-20 DIAGNOSIS — I159 Secondary hypertension, unspecified: Secondary | ICD-10-CM

## 2018-03-20 MED ORDER — VALACYCLOVIR HCL 500 MG PO TABS: 500.0000 mg | ORAL_TABLET | Freq: Two times a day (BID) | ORAL | 6 refills | Status: DC

## 2018-03-20 NOTE — Patient Instructions (Signed)
AREA PEDIATRIC/FAMILY PRACTICE PHYSICIANS  [facility] [address], [address] [place], [place]  [address] Phone - [phone]   Fax - [phone]  [facility] [address] [place], [place] [address] Phone - [phone]   Fax - [phone]  [facility] [address] [place], [place]  [address] Phone - [phone]   Fax - [phone]  [facility] [address] [place], [place]  [address] Phone - [phone]   Fax - [phone]  [facility] [address] [place], [place]  [address] Phone - [phone]   Fax - [phone]  [facility] [address], [address] [place], [place]  [address] Phone - [phone]   Fax - [phone]  [facility] [employer] [address] [place], [place]  [address] Phone - [phone]   Fax - [phone]  [facility] [address], [address] [place], [place]  [address] Phone - [phone]   Fax - [phone]  [facility] [address] [place], [place]  [address] Phone - [phone]   Fax - [phone] [facility] [address] [place], [place]  [address] Phone - [phone]   Fax - [phone]  [facility] [address] [place], [place]  [address] Phone - [phone]   Fax - [phone]  [facility] [address] [place]  [address] Phone - [phone]   Fax - [phone]  [facility] [address], [address] [place], [place]  [address] Phone - [phone]   Fax - [phone]  [facility] [address] [place], [place]  [address] Phone - [phone]  [facility] [address] [place], [place]  [address] Phone - [phone]   Fax - [phone]  [facility] [address] [place], [place]  [address] Phone - [phone]   Fax - [phone]  [facility] [address] [place], [place]  [address] Phone - [phone]   Fax - [phone]  [facility] [address] [place], [place]  [address] Phone - [phone]   Fax - [phone]   [facility] [address] [place], [place]  [address] Phone - [phone]   Fax - [phone]  [facility] [address] [place], [place]  [address] Phone - [phone]   Fax - [phone]  [facility] [address] [place], [place]  [address] Phone - [phone]   Fax - [phone]  [facility] [address] [place], [place]  [address] Phone - [phone]   Fax - [phone]  [facility] [address] , [place]  [address] Phone - [phone]   Fax - [phone] [facility] [address] [place], [place]  [address] Phone - [phone]   Fax - [phone]  [facility] [address] [place], [place]  [address] Phone - [phone]   Fax - [phone]  [facility] [place] [address] [place],   [address] Phone - [phone]   Fax - [phone]  [facility] Charlene Flemming MD [address] [place]  [address] Phone [phone]  Fax [phone]   

## 2018-03-20 NOTE — MAU Note (Signed)
Pt here today in [facility].  Pt states she can't stay for her BPP appointment today due to work schedule.  Pt [facility]. [facility]  Dr. Perry MountJacobson ordered a NST which was reactive today.  [facility]  Pt was explained risks even fetal death by RN of necessary [facility].  Pt was also consulted by Dr. Perry MountJacobson to explain risk/need of visits. Dr. Perry MountJacobson / Pt working out plan of care at this time.

## 2018-03-20 NOTE — Procedures (Signed)
Waymon BudgeBrooke K Sebo 1990-08-19 5454w6d  Fetus A Non-Stress Test Interpretation for 03/20/18  Indication: Chronic Hypertenstion  Fetal Heart Rate A Mode: External Baseline Rate (A): 140 bpm Variability: Moderate Accelerations: 15 x 15 Decelerations: None Multiple birth?: No  Uterine Activity Mode: Palpation, Toco Contraction Frequency (min): none Resting Tone Palpated: Relaxed  Interpretation (Fetal Testing) Nonstress Test Interpretation: Reactive Comments: Reviewed tracing with Dr. Perry MountJacobson

## 2018-03-20 NOTE — Progress Notes (Signed)
   PRENATAL VISIT NOTE  Subjective:  Bailey Carney is a 27 y.o. G1P0000 at 5832w6d being seen today for ongoing prenatal care.  She is currently monitored for the following issues for this high-risk pregnancy and has Asthma; Hypertension; Chronic hypertension during pregnancy, antepartum; Supervision of high risk pregnancy, antepartum; History of ELISA positive for HSV; and Acute cholecystitis on their problem list.  Patient reports no complaints.  Contractions: Not present. Vag. Bleeding: None.  Movement: Present. Denies leaking of fluid.   The following portions of the patient's history were reviewed and updated as appropriate: allergies, current medications, past family history, past medical history, past social history, past surgical history and problem list. Problem list updated.  Objective:   Vitals:   03/20/18 0841  BP: 123/74  Pulse: 90  Weight: 248 lb 8 oz (112.7 kg)    Fetal Status: Fetal Heart Rate (bpm): 135   Movement: Present     General:  Alert, oriented and cooperative. Patient is in no acute distress.  Skin: Skin is warm and dry. No rash noted.   Cardiovascular: Normal heart rate noted  Respiratory: Normal respiratory effort, no problems with respiration noted  Abdomen: Soft, gravid, appropriate for gestational age.  Pain/Pressure: Present     Pelvic: Cervical exam deferred        Extremities: Normal range of motion.  Edema: Trace  Mental Status: Normal mood and affect. Normal behavior. Normal judgment and thought content.   Assessment and Plan:  Pregnancy: G1P0000 at 4532w6d  1. Supervision of high risk pregnancy, antepartum - monthly MFM u/s  2. Chronic hypertension during pregnancy, antepartum  - US MFM FETAL BPP WO NON STRESS; Future  3. History of ELISA positive for HSV - valtrex prescribed, start in the next 2 weeks  Preterm labor symptoms and general obstetric precautions including but not limited to vaginal bleeding, contractions, leaking of fluid and  fetal movement were reviewed in detail with the patient. Please refer to After Visit Summary for other counseling recommendations.  Return in about 1 week (around 03/27/2018).  Future Appointments  Date Time Provider Department Center  03/20/2018  9:45 AM WH-[facility] US 2 WH-MFCUS MFC-US  03/27/2018  9:00 AM WH-[facility] US 3 WH-MFCUS MFC-US  04/03/2018  8:15 AM Allie Bossierove, Abelina Ketron C, MD [facility]A WOC  04/10/2018  8:15 AM Allie Bossierove, Marriah Sanderlin C, MD [facility] WOC    Allie BossierMyra C Leandria Thier, MD

## 2018-03-22 ENCOUNTER — Inpatient Hospital Stay (HOSPITAL_COMMUNITY)
Admission: AD | Admit: 2018-03-22 | Discharge: 2018-03-22 | Disposition: A | Discharge status: Home / Self Care | Payer: Medicaid Other | Source: Ambulatory Visit | Attending: Obstetrics and Gynecology | Admitting: Obstetrics and Gynecology

## 2018-03-22 ENCOUNTER — Other Ambulatory Visit: Payer: Self-pay

## 2018-03-22 ENCOUNTER — Encounter (HOSPITAL_COMMUNITY): Payer: Self-pay | Admitting: *Deleted

## 2018-03-22 DIAGNOSIS — Z3A34 34 weeks gestation of pregnancy: Secondary | ICD-10-CM | POA: Diagnosis not present

## 2018-03-22 DIAGNOSIS — O26893 Other specified pregnancy related conditions, third trimester: Secondary | ICD-10-CM | POA: Diagnosis not present

## 2018-03-22 DIAGNOSIS — O26899 Other specified pregnancy related conditions, unspecified trimester: Secondary | ICD-10-CM

## 2018-03-22 DIAGNOSIS — R109 Unspecified abdominal pain: Secondary | ICD-10-CM | POA: Diagnosis not present

## 2018-03-22 DIAGNOSIS — O368130 Decreased fetal movements, third trimester, not applicable or unspecified: Secondary | ICD-10-CM | POA: Diagnosis present

## 2018-03-22 DIAGNOSIS — Z880 Allergy status to penicillin: Secondary | ICD-10-CM | POA: Insufficient documentation

## 2018-03-22 DIAGNOSIS — Z3689 Encounter for other specified antenatal screening: Secondary | ICD-10-CM

## 2018-03-22 LAB — URINALYSIS, ROUTINE W REFLEX MICROSCOPIC
Bilirubin Urine: NEGATIVE
Glucose, UA: 50 mg/dL — AB
Hgb urine dipstick: NEGATIVE
KETONES UR: 5 mg/dL — AB
Nitrite: NEGATIVE
PH: 5 (ref 5.0–8.0)
Protein, ur: 30 mg/dL — AB
Specific Gravity, Urine: 1.03 (ref 1.005–1.030)

## 2018-03-22 NOTE — Discharge Instructions (Signed)

## 2018-03-22 NOTE — MAU Note (Signed)
Pt presents to MAU c/o DFM since 1100. Pt states there has been no movement since that time. No bleeding or LOF. Pt reports abdominal pain that is a 7/10 cramping.

## 2018-03-22 NOTE — MAU Provider Note (Signed)
History     CSN: [id number]  Arrival date and time: 03/22/18 2129   First Provider Initiated Contact with Patient 03/22/18 2149      Chief Complaint  Patient presents with  . Abdominal Pain  . Decreased Fetal Movement   HPI   Since the third trimester she has noticed that her babies movements have been less during the day and more at night. Today she noticed  around 5 pm that the baby was not moving as much. She drank some apple juice today while in MAU and has felt the baby move 4 times since she has been on the monitor for 20 minutes.  Having some pain in the middle of her abdomen that feels like cramps.  History of cholecystitis; plans for surgery after pregnancy. Says the pain has gotten better since she was diagnosed.   OB History    Gravida  1   Para  0   Term  0   Preterm  0   AB  0   Living  0     SAB  0   TAB  0   Ectopic  0   Multiple  0   Live Births  0           Past Medical History:  Diagnosis Date  . Asthma   . Chlamydia   . Cholecystitis   . Genital herpes   . Hypertension    on meds    Past Surgical History:  Procedure Laterality Date  . NO PAST SURGERIES      Family History  Problem Relation Age of Onset  . Hypertension Mother   . Breast cancer Maternal Grandmother   . Hypertension Maternal Grandmother   . Cancer Maternal Grandmother   . Breast cancer Paternal Grandmother   . Cancer Paternal Grandmother   . Cancer Maternal Grandfather     Social History   Tobacco Use  . Smoking status: Never Smoker  . Smokeless tobacco: Never Used  Substance Use Topics  . Alcohol use: Not Currently    Comment: socially prior to preg  . Drug use: No    Allergies:  Allergies  Allergen Reactions  . Amoxicillin Rash and Other (See Comments)    Has patient had a PCN reaction causing immediate rash, facial/tongue/throat swelling, SOB or lightheadedness with hypotension: No Has patient had a PCN reaction causing severe rash involving  mucus membranes or skin necrosis: No Has patient had a PCN reaction that required hospitalization No Has patient had a PCN reaction occurring within the last 10 years: No If all of the above answers are "NO", then may proceed with Cephalosporin use.     Medications Prior to Admission  Medication Sig Dispense Refill Last Dose  . albuterol (PROVENTIL HFA;VENTOLIN HFA) 108 (90 Base) MCG/ACT inhaler Inhale 1-2 puffs into the lungs every 6 (six) hours as needed for wheezing or shortness of breath. 1 Inhaler 0 Taking  . aspirin EC 81 MG tablet Take 1 tablet (81 mg total) by mouth daily. Take after 12 weeks for prevention of preeclampsia later in pregnancy 300 tablet 2 Taking  . labetalol (NORMODYNE) 100 MG tablet Take 1 tablet (100 mg total) by mouth 2 (two) times daily. 60 tablet 3 Taking  . Prenatal Vit-Fe Fumarate-FA (PRENATAL PO) Take 1 tablet by mouth at bedtime.   Taking  . valACYclovir (VALTREX) 500 MG tablet Take 1 tablet (500 mg total) by mouth 2 (two) times daily. (Patient not taking: Reported on 03/20/2018) 30  tablet 6 Not Taking   Results for orders placed or performed during the hospital encounter of 03/22/18 (from the past 48 hour(s))  Urinalysis, Routine w reflex microscopic     Status: Abnormal   Collection Time: 03/22/18 10:09 PM  Result Value Ref Range   Color, Urine YELLOW YELLOW   APPearance HAZY (A) CLEAR   Specific Gravity, Urine 1.030 1.005 - 1.030   pH 5.0 5.0 - 8.0   Glucose, UA 50 (A) NEGATIVE mg/dL   Hgb urine dipstick NEGATIVE NEGATIVE   Bilirubin Urine NEGATIVE NEGATIVE   Ketones, ur 5 (A) NEGATIVE mg/dL   Protein, ur 30 (A) NEGATIVE mg/dL   Nitrite NEGATIVE NEGATIVE   Leukocytes, UA MODERATE (A) NEGATIVE   RBC / HPF 0-5 0 - 5 RBC/hpf   WBC, UA 6-10 0 - 5 WBC/hpf   Bacteria, UA RARE (A) NONE SEEN   Squamous Epithelial / LPF 11-20 0 - 5   Mucus PRESENT     Comment: Performed at [facility], [address], [place], [place] [address]    Review of Systems   Constitutional: Negative for fever.  Gastrointestinal: Positive for abdominal pain (Mid-right sided pain.), nausea and vomiting (Has had vomiting in the third trimester).  Genitourinary: Negative for vaginal bleeding.   Physical Exam   Blood pressure 125/78, pulse (!) 119, last menstrual period 07/14/2017.  Physical Exam  Constitutional: She is oriented to person, place, and time. She appears well-developed and well-nourished. No distress.  HENT:  Head: Normocephalic.  Eyes: Pupils are equal, round, and reactive to light.  GI: There is tenderness in the periumbilical area. There is no rigidity, no rebound, no guarding, no CVA tenderness, no tenderness at McBurney's point and negative Murphy's sign.  Genitourinary:  Genitourinary Comments: Dilation: Closed Effacement (%): Thick Exam by:: J.Rasch, NP  Musculoskeletal: Normal range of motion.  Neurological: She is alert and oriented to person, place, and time.  Skin: Skin is warm. She is not diaphoretic.  Psychiatric: Her behavior is normal.   Fetal Tracing: Baseline: 135 bpm Variability: Moderate  Accelerations: 15x15 Decelerations: None Toco: UI  MAU Course  Procedures  None  MDM  NST reactive, patient now feeling the baby move.   Assessment and Plan   A:  1. Decreased fetal movement affecting management of pregnancy in third trimester, single or unspecified fetus   2. NST (non-stress test) reactive   3. Abdominal pain during pregnancy, antepartum     P:  Discharge home in stable condition Return to MAU if symptoms worsen Kick counts Low fat diet Preterm labor precautions  Rasch, Harolyn Rutherford, NP 03/23/2018 2:52 AM

## 2018-03-24 ENCOUNTER — Encounter (HOSPITAL_COMMUNITY): Payer: Self-pay

## 2018-03-24 ENCOUNTER — Ambulatory Visit (HOSPITAL_COMMUNITY)
Admission: RE | Admit: 2018-03-24 | Discharge: 2018-03-24 | Disposition: A | Discharge status: Home / Self Care | Payer: Medicaid Other | Source: Ambulatory Visit | Attending: Maternal and Fetal Medicine | Admitting: Maternal and Fetal Medicine

## 2018-03-24 DIAGNOSIS — O99513 Diseases of the respiratory system complicating pregnancy, third trimester: Secondary | ICD-10-CM | POA: Diagnosis not present

## 2018-03-24 DIAGNOSIS — O10913 Unspecified pre-existing hypertension complicating pregnancy, third trimester: Secondary | ICD-10-CM

## 2018-03-24 DIAGNOSIS — O99213 Obesity complicating pregnancy, third trimester: Secondary | ICD-10-CM | POA: Insufficient documentation

## 2018-03-24 DIAGNOSIS — O10013 Pre-existing essential hypertension complicating pregnancy, third trimester: Secondary | ICD-10-CM | POA: Diagnosis present

## 2018-03-24 DIAGNOSIS — J45909 Unspecified asthma, uncomplicated: Secondary | ICD-10-CM | POA: Insufficient documentation

## 2018-03-24 DIAGNOSIS — Z3A34 34 weeks gestation of pregnancy: Secondary | ICD-10-CM | POA: Diagnosis not present

## 2018-03-27 ENCOUNTER — Other Ambulatory Visit (HOSPITAL_COMMUNITY): Payer: Self-pay | Admitting: *Deleted

## 2018-03-27 ENCOUNTER — Encounter (HOSPITAL_COMMUNITY): Payer: Self-pay

## 2018-03-27 ENCOUNTER — Ambulatory Visit (HOSPITAL_COMMUNITY)
Admission: RE | Admit: 2018-03-27 | Discharge: 2018-03-27 | Disposition: A | Discharge status: Home / Self Care | Payer: Medicaid Other | Source: Ambulatory Visit | Attending: Obstetrics and Gynecology | Admitting: Obstetrics and Gynecology

## 2018-03-27 DIAGNOSIS — O10013 Pre-existing essential hypertension complicating pregnancy, third trimester: Secondary | ICD-10-CM | POA: Diagnosis not present

## 2018-03-27 DIAGNOSIS — O99213 Obesity complicating pregnancy, third trimester: Secondary | ICD-10-CM | POA: Diagnosis not present

## 2018-03-27 DIAGNOSIS — O10919 Unspecified pre-existing hypertension complicating pregnancy, unspecified trimester: Secondary | ICD-10-CM

## 2018-03-27 DIAGNOSIS — O99513 Diseases of the respiratory system complicating pregnancy, third trimester: Secondary | ICD-10-CM | POA: Insufficient documentation

## 2018-03-27 DIAGNOSIS — O10913 Unspecified pre-existing hypertension complicating pregnancy, third trimester: Secondary | ICD-10-CM

## 2018-03-27 DIAGNOSIS — Z3A34 34 weeks gestation of pregnancy: Secondary | ICD-10-CM | POA: Diagnosis not present

## 2018-03-27 DIAGNOSIS — J45909 Unspecified asthma, uncomplicated: Secondary | ICD-10-CM | POA: Diagnosis not present

## 2018-03-28 ENCOUNTER — Ambulatory Visit (INDEPENDENT_AMBULATORY_CARE_PROVIDER_SITE_OTHER): Payer: Medicaid Other | Admitting: Obstetrics & Gynecology

## 2018-03-28 ENCOUNTER — Other Ambulatory Visit (HOSPITAL_COMMUNITY)
Admission: RE | Admit: 2018-03-28 | Discharge: 2018-03-28 | Disposition: A | Discharge status: Home / Self Care | Payer: Medicaid Other | Source: Ambulatory Visit | Attending: Obstetrics & Gynecology | Admitting: Obstetrics & Gynecology

## 2018-03-28 VITALS — BP 131/90 | HR 96 | Wt 253.5 lb

## 2018-03-28 DIAGNOSIS — O10919 Unspecified pre-existing hypertension complicating pregnancy, unspecified trimester: Secondary | ICD-10-CM

## 2018-03-28 DIAGNOSIS — O0993 Supervision of high risk pregnancy, unspecified, third trimester: Secondary | ICD-10-CM

## 2018-03-28 DIAGNOSIS — O10913 Unspecified pre-existing hypertension complicating pregnancy, third trimester: Secondary | ICD-10-CM

## 2018-03-28 DIAGNOSIS — O0990 Supervision of high risk pregnancy, unspecified, unspecified trimester: Secondary | ICD-10-CM

## 2018-03-28 LAB — OB RESULTS CONSOLE GBS: GBS: NEGATIVE

## 2018-03-28 LAB — OB RESULTS CONSOLE GC/CHLAMYDIA: Gonorrhea: NEGATIVE

## 2018-03-28 NOTE — Progress Notes (Signed)
   PRENATAL VISIT NOTE  Subjective:  Bailey Carney is a 27 y.o. G1P0000 at 2454w0d being seen today for ongoing prenatal care.  She is currently monitored for the following issues for this high-risk pregnancy and has Asthma; Hypertension; Chronic hypertension during pregnancy, antepartum; Supervision of high risk pregnancy, antepartum; History of ELISA positive for HSV; and Acute cholecystitis on their problem list.  Patient reports no complaints.  Contractions: Not present. Vag. Bleeding: None.  Movement: Present. Denies leaking of fluid.   The following portions of the patient's history were reviewed and updated as appropriate: allergies, current medications, past family history, past medical history, past social history, past surgical history and problem list. Problem list updated.  Objective:   Vitals:   03/28/18 0854  BP: 131/90  Pulse: 96  Weight: 253 lb 8 oz (115 kg)    Fetal Status: Fetal Heart Rate (bpm): 145 Fundal Height: 37 cm Movement: Present     General:  Alert, oriented and cooperative. Patient is in no acute distress.  Skin: Skin is warm and dry. No rash noted.   Cardiovascular: Normal heart rate noted  Respiratory: Normal respiratory effort, no problems with respiration noted  Abdomen: Soft, gravid, appropriate for gestational age.  Pain/Pressure: Present     Pelvic: Cervical exam deferred      , she declines  Extremities: Normal range of motion.  Edema: Trace  Mental Status: Normal mood and affect. Normal behavior. Normal judgment and thought content.   Assessment and Plan:  Pregnancy: G1P0000 at 4554w0d  1. Chronic hypertension during pregnancy, antepartum - She did not take her BP today, will take it when she gets home, no symptoms of pre eclampsia - rec weekly BPP - IOL at 38 weeks per MFM  2. Supervision of high risk pregnancy, antepartum - cervical cultures today  Preterm labor symptoms and general obstetric precautions including but not limited to  vaginal bleeding, contractions, leaking of fluid and fetal movement were reviewed in detail with the patient. Please refer to After Visit Summary for other counseling recommendations.  No follow-ups on file.  Future Appointments  Date Time Provider Department Center  04/03/2018  8:15 AM Allie Bossierove, Reginald Mangels C, MD [facility]  04/04/2018  7:45 AM [facility] US 2 [facility]-M[facility]US [facility]-US  04/10/2018  8:15 AM Allie Bossierove, Reuven Braver C, MD [facility]  04/11/2018  8:00 AM [facility] US 3 [facility]-M[facility] M[facility]-US    Allie BossierMyra C Marenda Accardi, MD

## 2018-03-31 LAB — GC/CHLAMYDIA PROBE AMP (~~LOC~~) NOT AT ARMC
CHLAMYDIA, DNA PROBE: NEGATIVE
Neisseria Gonorrhea: NEGATIVE

## 2018-04-02 LAB — STREP GP B CULTURE+RFLX: STREP GP B CULTURE+RFLX: NEGATIVE

## 2018-04-03 ENCOUNTER — Ambulatory Visit (HOSPITAL_COMMUNITY)
Admission: RE | Admit: 2018-04-03 | Discharge: 2018-04-03 | Disposition: A | Discharge status: Home / Self Care | Payer: Medicaid Other | Source: Ambulatory Visit | Attending: Obstetrics and Gynecology | Admitting: Obstetrics and Gynecology

## 2018-04-03 ENCOUNTER — Encounter (HOSPITAL_COMMUNITY): Payer: Self-pay

## 2018-04-03 ENCOUNTER — Ambulatory Visit (HOSPITAL_COMMUNITY): Payer: Medicaid Other

## 2018-04-03 ENCOUNTER — Ambulatory Visit (INDEPENDENT_AMBULATORY_CARE_PROVIDER_SITE_OTHER): Payer: Medicaid Other | Admitting: Obstetrics & Gynecology

## 2018-04-03 VITALS — BP 115/80 | HR 105 | Wt 255.5 lb

## 2018-04-03 DIAGNOSIS — O10919 Unspecified pre-existing hypertension complicating pregnancy, unspecified trimester: Secondary | ICD-10-CM

## 2018-04-03 DIAGNOSIS — J45909 Unspecified asthma, uncomplicated: Secondary | ICD-10-CM | POA: Diagnosis not present

## 2018-04-03 DIAGNOSIS — O0990 Supervision of high risk pregnancy, unspecified, unspecified trimester: Secondary | ICD-10-CM

## 2018-04-03 DIAGNOSIS — O99213 Obesity complicating pregnancy, third trimester: Secondary | ICD-10-CM

## 2018-04-03 DIAGNOSIS — Z8619 Personal history of other infectious and parasitic diseases: Secondary | ICD-10-CM

## 2018-04-03 DIAGNOSIS — O99513 Diseases of the respiratory system complicating pregnancy, third trimester: Secondary | ICD-10-CM | POA: Diagnosis not present

## 2018-04-03 DIAGNOSIS — O10913 Unspecified pre-existing hypertension complicating pregnancy, third trimester: Secondary | ICD-10-CM

## 2018-04-03 DIAGNOSIS — Z3A35 35 weeks gestation of pregnancy: Secondary | ICD-10-CM | POA: Insufficient documentation

## 2018-04-03 DIAGNOSIS — O10013 Pre-existing essential hypertension complicating pregnancy, third trimester: Secondary | ICD-10-CM | POA: Insufficient documentation

## 2018-04-03 DIAGNOSIS — O99210 Obesity complicating pregnancy, unspecified trimester: Secondary | ICD-10-CM | POA: Insufficient documentation

## 2018-04-03 DIAGNOSIS — O0993 Supervision of high risk pregnancy, unspecified, third trimester: Secondary | ICD-10-CM

## 2018-04-03 NOTE — Progress Notes (Signed)
   PRENATAL VISIT NOTE  Subjective:  Bailey Carney is a 27 y.o. G1P0000 at 3023w6d being seen today for ongoing prenatal care.  She is currently monitored for the following issues for this high-risk pregnancy and has Asthma; Hypertension; Chronic hypertension during pregnancy, antepartum; Supervision of high risk pregnancy, antepartum; History of ELISA positive for HSV; Acute cholecystitis; and Obesity in pregnancy on their problem list.  Patient reports no complaints.  Contractions: Not present. Vag. Bleeding: None.  Movement: Present. Denies leaking of fluid.   The following portions of the patient's history were reviewed and updated as appropriate: allergies, current medications, past family history, past medical history, past social history, past surgical history and problem list. Problem list updated.  Objective:   Vitals:   04/03/18 0842  BP: 115/80  Pulse: (!) 105  Weight: 115.9 kg    Fetal Status: Fetal Heart Rate (bpm): 151   Movement: Present     General:  Alert, oriented and cooperative. Patient is in no acute distress.  Skin: Skin is warm and dry. No rash noted.   Cardiovascular: Normal heart rate noted  Respiratory: Normal respiratory effort, no problems with respiration noted  Abdomen: Soft, gravid, appropriate for gestational age.  Pain/Pressure: Present     Pelvic: Cervical exam deferred        Extremities: Normal range of motion.  Edema: Trace  Mental Status: Normal mood and affect. Normal behavior. Normal judgment and thought content.   Assessment and Plan:  Pregnancy: G1P0000 at 3923w6d  1. Supervision of high risk pregnancy, antepartum   2. History of ELISA positive for HSV - on Valtrex daily  3. Chronic hypertension during pregnancy, antepartum - on labetalol and baby asa - IOL at 38 weeks per MFM (scheduled for 04/18/17, orders for admission placed) - weekly BPP, she refuses to stay for NST today - MFM u/s last week normal  4. Obesity in  pregnancy   Preterm labor symptoms and general obstetric precautions including but not limited to vaginal bleeding, contractions, leaking of fluid and fetal movement were reviewed in detail with the patient. Please refer to After Visit Summary for other counseling recommendations.  No follow-ups on file.  Future Appointments  Date Time Provider Department Center  04/10/2018  8:15 AM Allie Bossierove, Hayley Horn C, MD [facility]  04/10/2018  9:15 AM [facility]-M[facility] US 4 [facility]-M[facility]US M[facility]-US    Allie BossierMyra C Khyler Eschmann, MD

## 2018-04-04 ENCOUNTER — Telehealth (HOSPITAL_COMMUNITY): Payer: Self-pay | Admitting: *Deleted

## 2018-04-04 ENCOUNTER — Ambulatory Visit (HOSPITAL_COMMUNITY): Payer: Medicaid Other

## 2018-04-04 NOTE — Telephone Encounter (Signed)
Preadmission screen  

## 2018-04-08 ENCOUNTER — Inpatient Hospital Stay (HOSPITAL_COMMUNITY)
Admission: AD | Admit: 2018-04-08 | Discharge: 2018-04-08 | Disposition: A | Discharge status: Home / Self Care | Payer: Medicaid Other | Source: Ambulatory Visit | Attending: Obstetrics and Gynecology | Admitting: Obstetrics and Gynecology

## 2018-04-08 ENCOUNTER — Encounter (HOSPITAL_COMMUNITY): Payer: Self-pay | Admitting: *Deleted

## 2018-04-08 DIAGNOSIS — O10013 Pre-existing essential hypertension complicating pregnancy, third trimester: Secondary | ICD-10-CM | POA: Diagnosis not present

## 2018-04-08 DIAGNOSIS — Z3A36 36 weeks gestation of pregnancy: Secondary | ICD-10-CM | POA: Diagnosis not present

## 2018-04-08 DIAGNOSIS — O368130 Decreased fetal movements, third trimester, not applicable or unspecified: Secondary | ICD-10-CM | POA: Insufficient documentation

## 2018-04-08 DIAGNOSIS — O10913 Unspecified pre-existing hypertension complicating pregnancy, third trimester: Secondary | ICD-10-CM | POA: Diagnosis not present

## 2018-04-08 DIAGNOSIS — O4703 False labor before 37 completed weeks of gestation, third trimester: Secondary | ICD-10-CM | POA: Diagnosis not present

## 2018-04-08 DIAGNOSIS — O10919 Unspecified pre-existing hypertension complicating pregnancy, unspecified trimester: Secondary | ICD-10-CM

## 2018-04-08 LAB — PROTEIN / CREATININE RATIO, URINE
Creatinine, Urine: 124 mg/dL
Protein Creatinine Ratio: 0.08 mg/mg{Cre} (ref 0.00–0.15)
Total Protein, Urine: 10 mg/dL

## 2018-04-08 LAB — COMPREHENSIVE METABOLIC PANEL
ALT: 15 U/L (ref 0–44)
AST: 18 U/L (ref 15–41)
Albumin: 3 g/dL — ABNORMAL LOW (ref 3.5–5.0)
Alkaline Phosphatase: 124 U/L (ref 38–126)
Anion gap: 7 (ref 5–15)
BUN: 8 mg/dL (ref 6–20)
CHLORIDE: 104 mmol/L (ref 98–111)
CO2: 22 mmol/L (ref 22–32)
Calcium: 8.4 mg/dL — ABNORMAL LOW (ref 8.9–10.3)
Creatinine, Ser: 0.6 mg/dL (ref 0.44–1.00)
GFR calc Af Amer: >60 mL/min (ref >60)
GFR calc non Af Amer: >60 mL/min (ref >60)
Glucose, Bld: 81 mg/dL (ref 70–99)
POTASSIUM: 3.8 mmol/L (ref 3.5–5.1)
SODIUM: 133 mmol/L — AB (ref 135–145)
Total Bilirubin: 0.6 mg/dL (ref 0.3–1.2)
Total Protein: 6.6 g/dL (ref 6.5–8.1)

## 2018-04-08 LAB — CBC
HCT: 33.9 % — ABNORMAL LOW (ref 36.0–46.0)
Hemoglobin: 11 g/dL — ABNORMAL LOW (ref 12.0–15.0)
MCH: 29.2 pg (ref 26.0–34.0)
MCHC: 32.4 g/dL (ref 30.0–36.0)
MCV: 89.9 fL (ref 80.0–100.0)
NRBC: 0 % (ref 0.0–0.2)
PLATELETS: 177 10*3/uL (ref 150–400)
RBC: 3.77 MIL/uL — ABNORMAL LOW (ref 3.87–5.11)
RDW: 13.4 % (ref 11.5–15.5)
WBC: 11 10*3/uL — ABNORMAL HIGH (ref 4.0–10.5)

## 2018-04-08 LAB — URINALYSIS, ROUTINE W REFLEX MICROSCOPIC
Bilirubin Urine: NEGATIVE
Glucose, UA: NEGATIVE mg/dL
Hgb urine dipstick: NEGATIVE
KETONES UR: NEGATIVE mg/dL
Nitrite: NEGATIVE
PROTEIN: NEGATIVE mg/dL
Specific Gravity, Urine: 1.016 (ref 1.005–1.030)
pH: 6 (ref 5.0–8.0)

## 2018-04-08 NOTE — MAU Note (Signed)
Pt reports generalized abd pain and back pain since this am. Denies bleeding.

## 2018-04-08 NOTE — Discharge Instructions (Signed)

## 2018-04-08 NOTE — MAU Provider Note (Signed)
Chief Complaint:  Abdominal Pain; Back Pain; and Decreased Fetal Movement   First Provider Initiated Contact with Patient 04/08/18 1614     HPI: Bailey Carney is a 27 y.o. G1P0000 at [redacted]w[redacted]d who presents to maternity admissions reporting abdominal pain & decreased fetal movement. Abdominal pain & tightening that is more frequent and painful today. Pain radiates to lower back. Denies n/v/d, dysuria, vaginal bleeding, or LOF.  Takes labetalol for CHTN. Denies headache, visual disturbance, epigastric pain. Hasn't missed doses.  Decreased fetal movement today. States she just got off work at 2 pm & normally doesn't feel movement while she active at work. States it's normal for her not to feel much movement until the evenings.   Location: abdomen & back Quality: contractions Severity: 8/10 in pain scale Duration: 1 day Timing: intermittent Modifying factors: none Associated signs and symptoms: none   Past Medical History:  Diagnosis Date  . Asthma   . Chlamydia   . Cholecystitis   . Genital herpes   . Hypertension    on meds   OB History  Gravida Para Term Preterm AB Living  1 0 0 0 0 0  SAB TAB Ectopic Multiple Live Births  0 0 0 0 0    # Outcome Date GA Lbr Len/2nd Weight Sex Delivery Anes PTL Lv  1 Current            Past Surgical History:  Procedure Laterality Date  . NO PAST SURGERIES     Family History  Problem Relation Age of Onset  . Hypertension Mother   . Breast cancer Maternal Grandmother   . Hypertension Maternal Grandmother   . Cancer Maternal Grandmother   . Breast cancer Paternal Grandmother   . Cancer Paternal Grandmother   . Cancer Maternal Grandfather    Social History   Tobacco Use  . Smoking status: Never Smoker  . Smokeless tobacco: Never Used  Substance Use Topics  . Alcohol use: Not Currently    Comment: socially prior to preg  . Drug use: No   Allergies  Allergen Reactions  . Amoxicillin Rash and Other (See Comments)    Has patient had a  PCN reaction causing immediate rash, facial/tongue/throat swelling, SOB or lightheadedness with hypotension: No Has patient had a PCN reaction causing severe rash involving mucus membranes or skin necrosis: No Has patient had a PCN reaction that required hospitalization No Has patient had a PCN reaction occurring within the last 10 years: No If all of the above answers are "NO", then may proceed with Cephalosporin use.    No medications prior to admission.    I have reviewed patient's Past Medical Hx, Surgical Hx, Family Hx, Social Hx, medications and allergies.   ROS:  Review of Systems  Constitutional: Negative.   Eyes: Negative for visual disturbance.  Gastrointestinal: Positive for abdominal pain. Negative for constipation, diarrhea, nausea and vomiting.  Genitourinary: Negative.   Musculoskeletal: Positive for back pain.  Neurological: Negative for headaches.    Physical Exam   Patient Vitals for the past 24 hrs:  BP Temp Temp src Pulse Resp SpO2 Height Weight  04/08/18 1818 121/74 - - 81 - - - -  04/08/18 1800 121/74 - - 81 - - - -  04/08/18 1745 119/67 - - 81 - - - -  04/08/18 1731 112/70 - - 86 - - - -  04/08/18 1724 135/79 - - 77 - - - -  04/08/18 1717 140/86 - - 81 - - - -  04/08/18 1532 126/80 98.7 F (37.1 C) Oral (!) 104 18 100 % 5\' 7"  (1.702 m) 118.4 kg    Constitutional: Well-developed, well-nourished female in no acute distress.  Cardiovascular: normal rate & rhythm, no murmur Respiratory: normal effort, lung sounds clear throughout GI: Abd soft, non-tender, gravid appropriate for gestational age. Pos BS x 4 MS: Extremities nontender, no edema, normal ROM Neurologic: Alert and oriented x 4.  GU:      Pelvic: NEFG, physiologic discharge, no blood, cervix clean.   Dilation: 1 Effacement (%): Thick Cervical Position: Posterior Station: -3 Presentation: Vertex Exam by:: Zenia ResidesNikki Risheq, RN   NST:  Baseline: 135 bpm, Variability: Good {> 6 bpm),  Accelerations: Reactive and Decelerations: Absent   Labs: Results for orders placed or performed during the hospital encounter of 04/08/18 (from the past 24 hour(s))  Urinalysis, Routine w reflex microscopic     Status: Abnormal   Collection Time: 04/08/18  4:34 PM  Result Value Ref Range   Color, Urine YELLOW YELLOW   APPearance CLEAR CLEAR   Specific Gravity, Urine 1.016 1.005 - 1.030   pH 6.0 5.0 - 8.0   Glucose, UA NEGATIVE NEGATIVE mg/dL   Hgb urine dipstick NEGATIVE NEGATIVE   Bilirubin Urine NEGATIVE NEGATIVE   Ketones, ur NEGATIVE NEGATIVE mg/dL   Protein, ur NEGATIVE NEGATIVE mg/dL   Nitrite NEGATIVE NEGATIVE   Leukocytes, UA SMALL (A) NEGATIVE   RBC / HPF 0-5 0 - 5 RBC/hpf   WBC, UA 0-5 0 - 5 WBC/hpf   Bacteria, UA RARE (A) NONE SEEN   Squamous Epithelial / LPF 0-5 0 - 5   Mucus PRESENT   Protein / creatinine ratio, urine     Status: None   Collection Time: 04/08/18  4:34 PM  Result Value Ref Range   Creatinine, Urine 124.00 mg/dL   Total Protein, Urine 10 mg/dL   Protein Creatinine Ratio 0.08 0.00 - 0.15 mg/mg[Cre]  CBC     Status: Abnormal   Collection Time: 04/08/18  5:38 PM  Result Value Ref Range   WBC 11.0 (H) 4.0 - 10.5 K/uL   RBC 3.77 (L) 3.87 - 5.11 MIL/uL   Hemoglobin 11.0 (L) 12.0 - 15.0 g/dL   HCT 16.133.9 (L) 09.636.0 - 04.546.0 %   MCV 89.9 80.0 - 100.0 fL   MCH 29.2 26.0 - 34.0 pg   MCHC 32.4 30.0 - 36.0 g/dL   RDW 40.913.4 81.111.5 - 91.415.5 %   Platelets 177 150 - 400 K/uL   nRBC 0.0 0.0 - 0.2 %  Comprehensive metabolic panel     Status: Abnormal   Collection Time: 04/08/18  5:38 PM  Result Value Ref Range   Sodium 133 (L) 135 - 145 mmol/L   Potassium 3.8 3.5 - 5.1 mmol/L   Chloride 104 98 - 111 mmol/L   CO2 22 22 - 32 mmol/L   Glucose, Bld 81 70 - 99 mg/dL   BUN 8 6 - 20 mg/dL   Creatinine, Ser 7.820.60 0.44 - 1.00 mg/dL   Calcium 8.4 (L) 8.9 - 10.3 mg/dL   Total Protein 6.6 6.5 - 8.1 g/dL   Albumin 3.0 (L) 3.5 - 5.0 g/dL   AST 18 15 - 41 U/L   ALT 15 0 - 44  U/L   Alkaline Phosphatase 124 38 - 126 U/L   Total Bilirubin 0.6 0.3 - 1.2 mg/dL   GFR calc non Af Amer >60 >60 mL/min   GFR calc Af Amer >60 >60 mL/min  Anion gap 7 5 - 15    Imaging:  No results found.  MAU Course: Orders Placed This Encounter  Procedures  . Culture, OB Urine  . Urinalysis, Routine w reflex microscopic  . CBC  . Comprehensive metabolic panel  . Protein / creatinine ratio, urine  . Discharge patient   No orders of the defined types were placed in this encounter.   MDM: Reactive NST with some UI. Cervix closed-1 cm, thick. Unchanged after 2+ hours of monitoring.  Elevated BP x 1, not severe range. Normal PEC labs  Assessment: 1. Chronic hypertension during pregnancy, antepartum   2. Preterm uterine contractions in third trimester, antepartum   3. [redacted] weeks gestation of pregnancy     Plan: Discharge home in stable condition.  Preterm Labor & preeclampsia precautions and fetal kick counts   Allergies as of 04/08/2018      Reactions   Amoxicillin Rash, Other (See Comments)   Has patient had a PCN reaction causing immediate rash, facial/tongue/throat swelling, SOB or lightheadedness with hypotension: No Has patient had a PCN reaction causing severe rash involving mucus membranes or skin necrosis: No Has patient had a PCN reaction that required hospitalization No Has patient had a PCN reaction occurring within the last 10 years: No If all of the above answers are "NO", then may proceed with Cephalosporin use.      Medication List    TAKE these medications   albuterol 108 (90 Base) MCG/ACT inhaler Commonly known as:  PROVENTIL HFA;VENTOLIN HFA Inhale 1-2 puffs into the lungs every 6 (six) hours as needed for wheezing or shortness of breath.   aspirin EC 81 MG tablet Take 1 tablet (81 mg total) by mouth daily. Take after 12 weeks for prevention of preeclampsia later in pregnancy   labetalol 100 MG tablet Commonly known as:  NORMODYNE Take 1  tablet (100 mg total) by mouth 2 (two) times daily.   PRENATAL PO Take 1 tablet by mouth at bedtime.   valACYclovir 500 MG tablet Commonly known as:  VALTREX Take 1 tablet (500 mg total) by mouth 2 (two) times daily.       Judeth HornLawrence, Jeyda Siebel, NP 04/08/2018 8:18 PM

## 2018-04-10 ENCOUNTER — Encounter (HOSPITAL_COMMUNITY): Payer: Self-pay

## 2018-04-10 ENCOUNTER — Ambulatory Visit: Payer: Medicaid Other | Admitting: Obstetrics & Gynecology

## 2018-04-10 ENCOUNTER — Ambulatory Visit (HOSPITAL_COMMUNITY)
Admission: RE | Admit: 2018-04-10 | Discharge: 2018-04-10 | Disposition: A | Discharge status: Home / Self Care | Payer: Medicaid Other | Source: Ambulatory Visit | Attending: Obstetrics & Gynecology | Admitting: Obstetrics & Gynecology

## 2018-04-10 VITALS — BP 122/83 | HR 90 | Wt 257.7 lb

## 2018-04-10 DIAGNOSIS — J45909 Unspecified asthma, uncomplicated: Secondary | ICD-10-CM | POA: Diagnosis not present

## 2018-04-10 DIAGNOSIS — O99213 Obesity complicating pregnancy, third trimester: Secondary | ICD-10-CM | POA: Insufficient documentation

## 2018-04-10 DIAGNOSIS — O10919 Unspecified pre-existing hypertension complicating pregnancy, unspecified trimester: Secondary | ICD-10-CM

## 2018-04-10 DIAGNOSIS — O10013 Pre-existing essential hypertension complicating pregnancy, third trimester: Secondary | ICD-10-CM

## 2018-04-10 DIAGNOSIS — Z3A36 36 weeks gestation of pregnancy: Secondary | ICD-10-CM | POA: Insufficient documentation

## 2018-04-10 DIAGNOSIS — O0990 Supervision of high risk pregnancy, unspecified, unspecified trimester: Secondary | ICD-10-CM

## 2018-04-10 DIAGNOSIS — O99513 Diseases of the respiratory system complicating pregnancy, third trimester: Secondary | ICD-10-CM | POA: Diagnosis not present

## 2018-04-10 DIAGNOSIS — O1013 Pre-existing hypertensive heart disease complicating the puerperium: Secondary | ICD-10-CM | POA: Diagnosis present

## 2018-04-10 DIAGNOSIS — O99210 Obesity complicating pregnancy, unspecified trimester: Secondary | ICD-10-CM

## 2018-04-10 LAB — CULTURE, OB URINE

## 2018-04-10 NOTE — Progress Notes (Signed)
Patient waited for less than 7 minutes before she decided that she was hungry and she left.

## 2018-04-11 ENCOUNTER — Ambulatory Visit (HOSPITAL_COMMUNITY): Payer: Medicaid Other

## 2018-04-13 ENCOUNTER — Encounter (HOSPITAL_COMMUNITY): Payer: Self-pay | Admitting: *Deleted

## 2018-04-13 ENCOUNTER — Inpatient Hospital Stay (HOSPITAL_COMMUNITY)
Admission: AD | Admit: 2018-04-13 | Discharge: 2018-04-13 | Disposition: A | Discharge status: Home / Self Care | Payer: Medicaid Other | Attending: Obstetrics & Gynecology | Admitting: Obstetrics & Gynecology

## 2018-04-13 DIAGNOSIS — Z3A37 37 weeks gestation of pregnancy: Secondary | ICD-10-CM | POA: Diagnosis not present

## 2018-04-13 DIAGNOSIS — O471 False labor at or after 37 completed weeks of gestation: Secondary | ICD-10-CM | POA: Insufficient documentation

## 2018-04-13 DIAGNOSIS — O479 False labor, unspecified: Secondary | ICD-10-CM

## 2018-04-13 NOTE — Discharge Instructions (Signed)
Braxton Hicks Contractions °Contractions of the uterus can occur throughout pregnancy, but they are not always a sign that you are in labor. You may have practice contractions called Braxton Hicks contractions. These false labor contractions are sometimes confused with true labor. °What are Braxton Hicks contractions? °Braxton Hicks contractions are tightening movements that occur in the muscles of the uterus before labor. Unlike true labor contractions, these contractions do not result in opening (dilation) and thinning of the cervix. Toward the end of pregnancy (32-34 weeks), Braxton Hicks contractions can happen more often and may become stronger. These contractions are sometimes difficult to tell apart from true labor because they can be very uncomfortable. You should not feel embarrassed if you go to the hospital with false labor. °Sometimes, the only way to tell if you are in true labor is for your health care provider to look for changes in the cervix. The health care provider will do a physical exam and may monitor your contractions. If you are not in true labor, the exam should show that your cervix is not dilating and your water has not broken. °If there are no other health problems associated with your pregnancy, it is completely safe for you to be sent home with false labor. You may continue to have Braxton Hicks contractions until you go into true labor. °How to tell the difference between true labor and false labor °True labor °· Contractions last 30-70 seconds. °· Contractions become very regular. °· Discomfort is usually felt in the top of the uterus, and it spreads to the lower abdomen and low back. °· Contractions do not go away with walking. °· Contractions usually become more intense and increase in frequency. °· The cervix dilates and gets thinner. °False labor °· Contractions are usually shorter and not as strong as true labor contractions. °· Contractions are usually irregular. °· Contractions  are often felt in the front of the lower abdomen and in the groin. °· Contractions may go away when you walk around or change positions while lying down. °· Contractions get weaker and are shorter-lasting as time goes on. °· The cervix usually does not dilate or become thin. °Follow these instructions at home: ° °· Take over-the-counter and prescription medicines only as told by your health care provider. °· Keep up with your usual exercises and follow other instructions from your health care provider. °· Eat and drink lightly if you think you are going into labor. °· If Braxton Hicks contractions are making you uncomfortable: °? Change your position from lying down or resting to walking, or change from walking to resting. °? Sit and rest in a tub of warm water. °? Drink enough fluid to keep your urine pale yellow. Dehydration may cause these contractions. °? Do slow and deep breathing several times an hour. °· Keep all follow-up prenatal visits as told by your health care provider. This is important. °Contact a health care provider if: °· You have a fever. °· You have continuous pain in your abdomen. °Get help right away if: °· Your contractions become stronger, more regular, and closer together. °· You have fluid leaking or gushing from your vagina. °· You pass blood-tinged mucus (bloody show). °· You have bleeding from your vagina. °· You have low back pain that you never had before. °· You feel your baby’s head pushing down and causing pelvic pressure. °· Your baby is not moving inside you as much as it used to. °Summary °· Contractions that occur before labor are   called Braxton Hicks contractions, false labor, or practice contractions. °· Braxton Hicks contractions are usually shorter, weaker, farther apart, and less regular than true labor contractions. True labor contractions usually become progressively stronger and regular, and they become more frequent. °· Manage discomfort from Braxton Hicks contractions  by changing position, resting in a warm bath, drinking plenty of water, or practicing deep breathing. °This information is not intended to replace advice given to you by your health care provider. Make sure you discuss any questions you have with your health care provider. °Document Released: 08/16/2016 Document Revised: 01/15/2017 Document Reviewed: 08/16/2016 °[employer]vier Interactive Patient Education © 2019 [employer] ° °Safe Medications in Pregnancy  ° °Acne: °Benzoyl Peroxide °Salicylic Acid ° °Backache/Headache: °Tylenol: 2 regular strength every 4 hours OR °             2 Extra strength every 6 hours ° °Colds/Coughs/Allergies: °Benadryl (alcohol free) 25 mg every 6 hours as needed °Breath right strips °Claritin °Cepacol throat lozenges °Chloraseptic throat spray °Cold-Eeze- up to three times per day °Cough drops, alcohol free °Flonase (by prescription only) °Guaifenesin °Mucinex °Robitussin DM (plain only, alcohol free) °Saline nasal spray/drops °Sudafed (pseudoephedrine) & Actifed ** use only after [redacted] weeks gestation and if you do not have high blood pressure °Tylenol °Vicks Vaporub °Zinc lozenges °Zyrtec  ° °Constipation: °Colace °Ducolax suppositories °Fleet enema °Glycerin suppositories °Metamucil °Milk of magnesia °Miralax °Senokot °Smooth move tea ° °Diarrhea: °Kaopectate °Imodium A-D ° °*NO pepto Bismol ° °Hemorrhoids: °Anusol °Anusol HC °Preparation H °Tucks ° °Indigestion: °Tums °Maalox °Mylanta °Zantac  °Pepcid ° °Insomnia: °Benadryl (alcohol free) 25mg every 6 hours as needed °Tylenol PM °Unisom, no Gelcaps ° °Leg Cramps: °Tums °MagGel ° °Nausea/Vomiting:  °Bonine °Dramamine °Emetrol °Ginger extract °Sea bands °Meclizine  °Nausea medication to take during pregnancy:  °Unisom (doxylamine succinate 25 mg tablets) Take one tablet daily at bedtime. If symptoms are not adequately controlled, the dose can be increased to a maximum recommended dose of two tablets daily (1/2 tablet in the morning, 1/2  tablet mid-afternoon and one at bedtime). °Vitamin B6 100mg tablets. Take one tablet twice a day (up to 200 mg per day). ° °Skin Rashes: °Aveeno products °Benadryl cream or 25mg every 6 hours as needed °Calamine Lotion °1% cortisone cream ° °Yeast infection: °Gyne-lotrimin 7 °Monistat 7 ° ° °**If taking multiple medications, please check labels to avoid duplicating the same active ingredients °**take medication as directed on the label °** Do not exceed 4000 mg of tylenol in 24 hours °**Do not take medications that contain aspirin or ibuprofen ° ° ° ° °

## 2018-04-13 NOTE — MAU Note (Signed)
Bailey Carney is a 27 y.o. at 3724w2d here in MAU reporting:  +upper abdominal pain. Intermittent. Cramping.  Onset of complaint: ongoing since christmas eve. States was seen in [place] and was told it was braxton hicks but she states she feels like she should not be hurting still. Pain score: 10/10. Has not taken anything for the pain. Vitals:   04/13/18 0716  BP: 116/70  Pulse: 90  Resp: 18  Temp: 98.6 F (37 C)  SpO2: 100%  +FM Lab orders placed from triage: ua. Patient states she is unable to void at this time.

## 2018-04-13 NOTE — MAU Provider Note (Signed)
First Provider Initiated Contact with Patient 04/13/18 [id number]     S: [name] is a 27 y.o. G1P0000 at 4720w2d  who presents to MAU today complaining contractions q 3 minutes since 12/24. She denies vaginal bleeding. She denies LOF. She reports normal fetal movement.    O: BP 116/70 (BP Location: Right Arm)   Pulse 90   Temp 98.6 F (37 C) (Oral)   Resp 18   Wt 117.9 kg   LMP 07/14/2017 (Exact Date)   SpO2 100% Comment: ra  BMI 40.72 kg/m  GENERAL: Well-developed, well-nourished female in no acute distress.  HEAD: Normocephalic, atraumatic.  CHEST: Normal effort of breathing, regular heart rate ABDOMEN: Soft, nontender, gravid  Cervical exam:  Dilation: 1.5 Effacement (%): Thick Cervical Position: Posterior Station: -3 Presentation: Vertex Exam by:: Janeth Rasehristina Robinson RN   Fetal Monitoring: Baseline: 130 Variability: moderate Accelerations: 15x15 Decelerations: none Contractions: ui  Cervix unchanged from 4 days ago. No regular contractions.    A: [facility] at 3020w2d  False labor  P: Discharge patient home Labor precautions reviewed Patient may return to MAU if condition worsens or changes  Rolm Bookbindereill, Caroline M, [place] 04/13/2018 7:43 AM

## 2018-04-16 NOTE — L&D Delivery Note (Signed)
Delivery Note Patient is a 28 y.o. now G1P0000 s/p NSVD at [redacted]w[redacted]d, who was admitted for IOL for cHTN and obesity per MFM. Called to room for repeated decels and rapid progression. After foley bulb with cytotec, followed by pitocin titration, she progressed to complete and pushed 3x after AROM to deliver. At 7:51 PM a viable female was delivered via Vaginal, Spontaneous (Presentation: cephalic; LOA).  APGAR: 8, 9; weight pending.  Cord clamping delayed by several minutes then clamped by SNM and cut by MGM.  3-vessel cord intact, cord pH: n/a. Placenta intact and spontaneous, bleeding minimal.  1st degree perineal and succal tears repaired without difficulty.  Mom and baby stable prior to transfer to postpartum. She plans on breastfeeding. She unsure about birth control.  Anesthesia:  Epidural Episiotomy: None Lacerations: 1st degree;Sulcus;Perineal Suture Repair: 3.0 monocril Est. Blood Loss (mL): 238  Mom to postpartum.  Baby to Couplet care / Skin to Skin.  Bernerd Limbo, SNM 04/18/2018, 8:33 PM

## 2018-04-18 ENCOUNTER — Encounter (HOSPITAL_COMMUNITY): Payer: Self-pay

## 2018-04-18 ENCOUNTER — Other Ambulatory Visit: Payer: Self-pay

## 2018-04-18 ENCOUNTER — Inpatient Hospital Stay (HOSPITAL_COMMUNITY)
Admission: RE | Admit: 2018-04-18 | Discharge: 2018-04-20 | DRG: 806 | Disposition: A | Discharge status: Home / Self Care | Payer: Medicaid Other | Attending: Obstetrics and Gynecology | Admitting: Obstetrics and Gynecology

## 2018-04-18 ENCOUNTER — Inpatient Hospital Stay (HOSPITAL_COMMUNITY): Payer: Medicaid Other | Admitting: Anesthesiology

## 2018-04-18 VITALS — BP 122/75 | HR 77 | Temp 98.0°F | Resp 18

## 2018-04-18 DIAGNOSIS — O99214 Obesity complicating childbirth: Secondary | ICD-10-CM | POA: Diagnosis present

## 2018-04-18 DIAGNOSIS — O1002 Pre-existing essential hypertension complicating childbirth: Secondary | ICD-10-CM | POA: Diagnosis present

## 2018-04-18 DIAGNOSIS — A6000 Herpesviral infection of urogenital system, unspecified: Secondary | ICD-10-CM | POA: Diagnosis present

## 2018-04-18 DIAGNOSIS — K810 Acute cholecystitis: Secondary | ICD-10-CM | POA: Diagnosis present

## 2018-04-18 DIAGNOSIS — Z8619 Personal history of other infectious and parasitic diseases: Secondary | ICD-10-CM | POA: Diagnosis present

## 2018-04-18 DIAGNOSIS — O99210 Obesity complicating pregnancy, unspecified trimester: Secondary | ICD-10-CM | POA: Diagnosis present

## 2018-04-18 DIAGNOSIS — E669 Obesity, unspecified: Secondary | ICD-10-CM | POA: Diagnosis present

## 2018-04-18 DIAGNOSIS — Z3A38 38 weeks gestation of pregnancy: Secondary | ICD-10-CM

## 2018-04-18 DIAGNOSIS — O9952 Diseases of the respiratory system complicating childbirth: Secondary | ICD-10-CM | POA: Diagnosis present

## 2018-04-18 DIAGNOSIS — Z880 Allergy status to penicillin: Secondary | ICD-10-CM

## 2018-04-18 DIAGNOSIS — O2662 Liver and biliary tract disorders in childbirth: Secondary | ICD-10-CM | POA: Diagnosis present

## 2018-04-18 DIAGNOSIS — O9832 Other infections with a predominantly sexual mode of transmission complicating childbirth: Secondary | ICD-10-CM | POA: Diagnosis present

## 2018-04-18 DIAGNOSIS — O0990 Supervision of high risk pregnancy, unspecified, unspecified trimester: Secondary | ICD-10-CM

## 2018-04-18 DIAGNOSIS — J45909 Unspecified asthma, uncomplicated: Secondary | ICD-10-CM | POA: Diagnosis present

## 2018-04-18 DIAGNOSIS — O10919 Unspecified pre-existing hypertension complicating pregnancy, unspecified trimester: Secondary | ICD-10-CM

## 2018-04-18 LAB — CBC
HCT: 32.9 % — ABNORMAL LOW (ref 36.0–46.0)
HCT: 33.8 % — ABNORMAL LOW (ref 36.0–46.0)
HEMATOCRIT: 32.4 % — AB (ref 36.0–46.0)
Hemoglobin: 10.8 g/dL — ABNORMAL LOW (ref 12.0–15.0)
Hemoglobin: 11.1 g/dL — ABNORMAL LOW (ref 12.0–15.0)
Hemoglobin: 10.6 g/dL — ABNORMAL LOW (ref 12.0–15.0)
MCH: 29.6 pg (ref 26.0–34.0)
MCH: 29.4 pg (ref 26.0–34.0)
MCH: 29.3 pg (ref 26.0–34.0)
MCHC: 32.8 g/dL (ref 30.0–36.0)
MCHC: 32.8 g/dL (ref 30.0–36.0)
MCHC: 32.7 g/dL (ref 30.0–36.0)
MCV: 90.1 fL (ref 80.0–100.0)
MCV: 89.4 fL (ref 80.0–100.0)
MCV: 89.5 fL (ref 80.0–100.0)
Platelets: 181 10*3/uL (ref 150–400)
Platelets: 178 10*3/uL (ref 150–400)
Platelets: 166 10*3/uL (ref 150–400)
RBC: 3.65 MIL/uL — ABNORMAL LOW (ref 3.87–5.11)
RBC: 3.78 MIL/uL — ABNORMAL LOW (ref 3.87–5.11)
RBC: 3.62 MIL/uL — ABNORMAL LOW (ref 3.87–5.11)
RDW: 13.4 % (ref 11.5–15.5)
RDW: 13.4 % (ref 11.5–15.5)
RDW: 13.2 % (ref 11.5–15.5)
WBC: 10.5 10*3/uL (ref 4.0–10.5)
WBC: 13.4 10*3/uL — ABNORMAL HIGH (ref 4.0–10.5)
WBC: 14.3 10*3/uL — ABNORMAL HIGH (ref 4.0–10.5)
nRBC: 0 % (ref 0.0–0.2)
nRBC: 0 % (ref 0.0–0.2)
nRBC: 0 % (ref 0.0–0.2)

## 2018-04-18 LAB — SYPHILIS: RPR W/REFLEX TO RPR TITER AND TREPONEMAL ANTIBODIES, TRADITIONAL SCREENING AND DIAGNOSIS ALGORITHM: RPR Ser Ql: NONREACTIVE

## 2018-04-18 LAB — TYPE AND SCREEN
ABO/RH(D): B POS
Antibody Screen: NEGATIVE

## 2018-04-18 MED ORDER — LACTATED RINGERS IV SOLN: 500.0000 mL | INTRAVENOUS | Status: DC | PRN

## 2018-04-18 MED ORDER — DIPHENHYDRAMINE HCL 50 MG/ML IJ SOLN: 12.5000 mg | INTRAMUSCULAR | Status: DC | PRN

## 2018-04-18 MED ORDER — NIFEDIPINE ER OSMOTIC RELEASE 30 MG PO TB24
30.0000 mg | ORAL_TABLET | Freq: Every day | ORAL | Status: DC
  Administered 2018-04-18 – 2018-04-19 (×2): 30 mg via ORAL
  Filled 2018-04-18 (×3): qty 1

## 2018-04-18 MED ORDER — LACTATED RINGERS IV SOLN
500.0000 mL | Freq: Once | INTRAVENOUS | Status: AC
  Administered 2018-04-18: 500 mL via INTRAVENOUS

## 2018-04-18 MED ORDER — OXYTOCIN 40 UNITS IN LACTATED RINGERS INFUSION - SIMPLE MED
2.5000 [IU]/h | INTRAVENOUS | Status: DC
  Filled 2018-04-18: qty 1000

## 2018-04-18 MED ORDER — LABETALOL HCL 100 MG PO TABS: 100.0000 mg | ORAL_TABLET | Freq: Two times a day (BID) | ORAL | Status: DC

## 2018-04-18 MED ORDER — FENTANYL CITRATE (PF) 100 MCG/2ML IJ SOLN
100.0000 ug | INTRAMUSCULAR | Status: DC | PRN
  Administered 2018-04-18: 100 ug via INTRAVENOUS
  Filled 2018-04-18: qty 2

## 2018-04-18 MED ORDER — PRENATAL MULTIVITAMIN CH
1.0000 | ORAL_TABLET | Freq: Every day | ORAL | Status: DC
  Administered 2018-04-19: 1 via ORAL
  Filled 2018-04-18: qty 1

## 2018-04-18 MED ORDER — FENTANYL 2.5 MCG/ML BUPIVACAINE 1/10 % EPIDURAL INFUSION (WH - ANES)
14.0000 mL/h | INTRAMUSCULAR | Status: DC | PRN
  Administered 2018-04-18: 14 mL/h via EPIDURAL
  Filled 2018-04-18: qty 100

## 2018-04-18 MED ORDER — LABETALOL HCL 5 MG/ML IV SOLN: 40.0000 mg | INTRAVENOUS | Status: DC | PRN

## 2018-04-18 MED ORDER — MISOPROSTOL 50MCG HALF TABLET
50.0000 ug | ORAL_TABLET | ORAL | Status: DC | PRN
  Administered 2018-04-18: 50 ug via ORAL
  Filled 2018-04-18 (×2): qty 1

## 2018-04-18 MED ORDER — TERBUTALINE SULFATE 1 MG/ML IJ SOLN
0.2500 mg | Freq: Once | INTRAMUSCULAR | Status: DC | PRN
  Filled 2018-04-18: qty 1

## 2018-04-18 MED ORDER — ACETAMINOPHEN 325 MG PO TABS: 650.0000 mg | ORAL_TABLET | ORAL | Status: DC | PRN

## 2018-04-18 MED ORDER — SOD CITRATE-CITRIC ACID 500-334 MG/5ML PO SOLN: 30.0000 mL | ORAL | Status: DC | PRN

## 2018-04-18 MED ORDER — SIMETHICONE 80 MG PO CHEW: 80.0000 mg | CHEWABLE_TABLET | ORAL | Status: DC | PRN

## 2018-04-18 MED ORDER — ONDANSETRON HCL 4 MG PO TABS: 4.0000 mg | ORAL_TABLET | ORAL | Status: DC | PRN

## 2018-04-18 MED ORDER — HYDRALAZINE HCL 20 MG/ML IJ SOLN
INTRAMUSCULAR | Status: AC
  Filled 2018-04-18: qty 1

## 2018-04-18 MED ORDER — WITCH HAZEL-GLYCERIN EX PADS: 1.0000 "application " | MEDICATED_PAD | CUTANEOUS | Status: DC | PRN

## 2018-04-18 MED ORDER — OXYCODONE-ACETAMINOPHEN 5-325 MG PO TABS: 2.0000 | ORAL_TABLET | ORAL | Status: DC | PRN

## 2018-04-18 MED ORDER — TETANUS-DIPHTH-ACELL PERTUSSIS 5-2.5-18.5 LF-MCG/0.5 IM SUSP: 0.5000 mL | Freq: Once | INTRAMUSCULAR | Status: DC

## 2018-04-18 MED ORDER — PHENYLEPHRINE 40 MCG/ML (10ML) SYRINGE FOR IV PUSH (FOR BLOOD PRESSURE SUPPORT)
80.0000 ug | PREFILLED_SYRINGE | INTRAVENOUS | Status: DC | PRN
  Filled 2018-04-18: qty 10

## 2018-04-18 MED ORDER — HYDRALAZINE HCL 20 MG/ML IJ SOLN: 5.0000 mg | INTRAMUSCULAR | Status: DC | PRN

## 2018-04-18 MED ORDER — LABETALOL HCL 5 MG/ML IV SOLN: 20.0000 mg | INTRAVENOUS | Status: DC | PRN

## 2018-04-18 MED ORDER — EPHEDRINE 5 MG/ML INJ
10.0000 mg | INTRAVENOUS | Status: DC | PRN
  Filled 2018-04-18: qty 2

## 2018-04-18 MED ORDER — DIBUCAINE 1 % RE OINT: 1.0000 "application " | TOPICAL_OINTMENT | RECTAL | Status: DC | PRN

## 2018-04-18 MED ORDER — LACTATED RINGERS IV SOLN
INTRAVENOUS | Status: DC
  Administered 2018-04-18 (×2): via INTRAVENOUS

## 2018-04-18 MED ORDER — OXYTOCIN 40 UNITS IN LACTATED RINGERS INFUSION - SIMPLE MED
1.0000 m[IU]/min | INTRAVENOUS | Status: DC
  Administered 2018-04-18: 2 m[IU]/min via INTRAVENOUS

## 2018-04-18 MED ORDER — DIPHENHYDRAMINE HCL 25 MG PO CAPS: 25.0000 mg | ORAL_CAPSULE | Freq: Four times a day (QID) | ORAL | Status: DC | PRN

## 2018-04-18 MED ORDER — OXYCODONE-ACETAMINOPHEN 5-325 MG PO TABS: 1.0000 | ORAL_TABLET | ORAL | Status: DC | PRN

## 2018-04-18 MED ORDER — ONDANSETRON HCL 4 MG/2ML IJ SOLN: 4.0000 mg | Freq: Four times a day (QID) | INTRAMUSCULAR | Status: DC | PRN

## 2018-04-18 MED ORDER — PHENYLEPHRINE 40 MCG/ML (10ML) SYRINGE FOR IV PUSH (FOR BLOOD PRESSURE SUPPORT)
80.0000 ug | PREFILLED_SYRINGE | INTRAVENOUS | Status: DC | PRN
  Filled 2018-04-18 (×2): qty 10

## 2018-04-18 MED ORDER — LIDOCAINE-EPINEPHRINE (PF) 2 %-1:200000 IJ SOLN
INTRAMUSCULAR | Status: DC | PRN
  Administered 2018-04-18: 3 mL via EPIDURAL
  Administered 2018-04-18: 4 mL via EPIDURAL

## 2018-04-18 MED ORDER — ZOLPIDEM TARTRATE 5 MG PO TABS: 5.0000 mg | ORAL_TABLET | Freq: Every evening | ORAL | Status: DC | PRN

## 2018-04-18 MED ORDER — ACETAMINOPHEN 325 MG PO TABS
650.0000 mg | ORAL_TABLET | ORAL | Status: DC | PRN
  Administered 2018-04-19: 650 mg via ORAL
  Filled 2018-04-18: qty 2

## 2018-04-18 MED ORDER — IBUPROFEN 600 MG PO TABS
600.0000 mg | ORAL_TABLET | Freq: Four times a day (QID) | ORAL | Status: DC
  Administered 2018-04-19 – 2018-04-20 (×5): 600 mg via ORAL
  Filled 2018-04-18 (×5): qty 1

## 2018-04-18 MED ORDER — ONDANSETRON HCL 4 MG/2ML IJ SOLN: 4.0000 mg | INTRAMUSCULAR | Status: DC | PRN

## 2018-04-18 MED ORDER — HYDRALAZINE HCL 20 MG/ML IJ SOLN: 10.0000 mg | INTRAMUSCULAR | Status: DC | PRN

## 2018-04-18 MED ORDER — BENZOCAINE-MENTHOL 20-0.5 % EX AERO
1.0000 "application " | INHALATION_SPRAY | CUTANEOUS | Status: DC | PRN
  Administered 2018-04-19: 1 via TOPICAL
  Filled 2018-04-18: qty 56

## 2018-04-18 MED ORDER — SENNOSIDES-DOCUSATE SODIUM 8.6-50 MG PO TABS
2.0000 | ORAL_TABLET | ORAL | Status: DC
  Administered 2018-04-19: 2 via ORAL
  Filled 2018-04-18: qty 2

## 2018-04-18 MED ORDER — COCONUT OIL OIL: 1.0000 "application " | TOPICAL_OIL | Status: DC | PRN

## 2018-04-18 MED ORDER — OXYTOCIN BOLUS FROM INFUSION
500.0000 mL | Freq: Once | INTRAVENOUS | Status: AC
  Administered 2018-04-18: 500 mL via INTRAVENOUS

## 2018-04-18 MED ORDER — LIDOCAINE HCL (PF) 1 % IJ SOLN
30.0000 mL | INTRAMUSCULAR | Status: DC | PRN
  Filled 2018-04-18: qty 30

## 2018-04-18 NOTE — Anesthesia Pain Management Evaluation Note (Signed)
  [facility] Pain Management Visit Note  Patient: Bailey Carney, 28 y.o., female  "Hello I am a member of the anesthesia team at [facility]. We have an anesthesia team available at all times to provide care throughout the hospital, including epidural management and anesthesia for C-section. I don't know your plan for the delivery whether it a natural birth, water birth, IV sedation, nitrous supplementation, doula or epidural, but we want to meet your pain goals."   1.Was your pain managed to your expectations on prior hospitalizations?   No prior hospitalizations  2.What is your expectation for pain management during this hospitalization?     Epidural  3.How can we help you reach that goal?   Record the patient's initial score and the patient's pain goal.   Pain: 4  Pain Goal: 7 [facility] wants you to be able to say your pain was always managed very well.  Laban Emperor 04/18/2018

## 2018-04-18 NOTE — H&P (Addendum)
LABOR ADMIT HISTORY AND PHYSICAL  Chief Complaint:  No chief complaint on file.   Bailey Carney is a 28 y.o.  G1P0000 at [redacted]w[redacted]d by first trimester ultrasound presenting for induction of labor for hx of chronic hypertension and comorbidity of obesity. Has been on Labetalol 100 mg BID.   Patient with history of HSV. No recent outbreaks. Has been on suppressive therapy with Valtrex since [redacted] weeks GA. No prodromal symptoms.   Patient states she has been having no contractions, no vaginal bleeding, no vaginal discharge, and with active fetal movement. Patient has been seen regularly by OBGYN for prenatal care and pregnancy management.   Past Medical History:  Diagnosis Date  . Asthma    does not use inhaler often  . Chlamydia   . Cholecystitis   . Genital herpes   . Hypertension    on meds    Past Surgical History:  Procedure Laterality Date  . NO PAST SURGERIES      Family History  Problem Relation Age of Onset  . Hypertension Mother   . Breast cancer Maternal Grandmother   . Hypertension Maternal Grandmother   . Cancer Maternal Grandmother   . Breast cancer Paternal Grandmother   . Cancer Paternal Grandmother   . Cancer Maternal Grandfather     Social History   Tobacco Use  . Smoking status: Never Smoker  . Smokeless tobacco: Never Used  Substance Use Topics  . Alcohol use: Not Currently    Comment: socially prior to preg  . Drug use: No    Allergies  Allergen Reactions  . Amoxicillin Rash and Other (See Comments)    Has patient had a PCN reaction causing immediate rash, facial/tongue/throat swelling, SOB or lightheadedness with hypotension: No Has patient had a PCN reaction causing severe rash involving mucus membranes or skin necrosis: No Has patient had a PCN reaction that required hospitalization No Has patient had a PCN reaction occurring within the last 10 years: No If all of the above answers are "NO", then may proceed with Cephalosporin use.      Medications Prior to Admission  Medication Sig Dispense Refill Last Dose  . aspirin EC 81 MG tablet Take 1 tablet (81 mg total) by mouth daily. Take after 12 weeks for prevention of preeclampsia later in pregnancy 300 tablet 2 04/18/2018 at Unknown time  . labetalol (NORMODYNE) 100 MG tablet Take 1 tablet (100 mg total) by mouth 2 (two) times daily. 60 tablet 3 04/18/2018 at 0630   . valACYclovir (VALTREX) 500 MG tablet Take 1 tablet (500 mg total) by mouth 2 (two) times daily. 30 tablet 6 04/18/2018 at Unknown time  . albuterol (PROVENTIL HFA;VENTOLIN HFA) 108 (90 Base) MCG/ACT inhaler Inhale 1-2 puffs into the lungs every 6 (six) hours as needed for wheezing or shortness of breath. 1 Inhaler 0 emergency  . Prenatal Vit-Fe Fumarate-FA (PRENATAL PO) Take 1 tablet by mouth at bedtime.   04/15/2018    Review of Systems - Negative except for what is mentioned in HPI.  Physical Exam  Blood pressure (!) 140/96, pulse 88, temperature 98.3 F (36.8 C), temperature source Oral, resp. rate 20, last menstrual period 07/14/2017. GENERAL: Well-developed, well-nourished female in no acute distress.  LUNGS: No respiratory distress HEART: Regular rate ABDOMEN: Soft, nontender, nondistended, Bowel sounds auscultated.  EXTREMITIES: Nontender, no edema, 2+ distal pulses. GU: 3 / 70 / -2 Presentation: cephalic FHT: 120 bpm, moderate variability, +acels, no decels  Contractions: None   Labs: Results for  orders placed or performed during the hospital encounter of 04/18/18 (from the past 24 hour(s))  CBC   Collection Time: 04/18/18  7:48 AM  Result Value Ref Range   WBC 10.5 4.0 - 10.5 K/uL   RBC 3.65 (L) 3.87 - 5.11 MIL/uL   Hemoglobin 10.8 (L) 12.0 - 15.0 g/dL   HCT 81.1 (L) 91.4 - 78.2 %   MCV 90.1 80.0 - 100.0 fL   MCH 29.6 26.0 - 34.0 pg   MCHC 32.8 30.0 - 36.0 g/dL   RDW 95.6 21.3 - 08.6 %   Platelets 181 150 - 400 K/uL   nRBC 0.0 0.0 - 0.2 %  Type and screen   Collection Time: 04/18/18  7:48  AM  Result Value Ref Range   ABO/RH(D) B POS    Antibody Screen NEG    Sample Expiration      04/21/2018 Performed at [facility], [address], [place], [place] [address]    Imaging Studies:  [place] Mfm Fetal Bpp Wo Non Stress  Result Date: 04/10/2018 ----------------------------------------------------------------------  OBSTETRICS REPORT                       (Signed Final 04/10/2018 10:25 am) ---------------------------------------------------------------------- Patient Info  ID #:       [id number]                          D.O.B.:  February 01, 1991 (27 yrs)  Name:       Bailey Carney                  Visit Date: 04/10/2018 09:34 am ---------------------------------------------------------------------- Performed By  Performed By:     Eden Lathe BS      Ref. Address:     [address]                    [facility]                                                             [address]                                                             [place], [place]                                                             [address]  Attending:        Noralee Space MD        Location:         [facility]  Referred By:      Hermina Staggers                    MD ---------------------------------------------------------------------- Orders   #  Description  Code         Ordered By   1  [place] MFM FETAL BPP WO NON              [id number]     MYRA DOVE      STRESS  ----------------------------------------------------------------------   #  Order #                    Accession #                 Episode #   1  [id number]                  [id number]                  [id number]  ---------------------------------------------------------------------- Indications   Hypertension - Chronic/Pre-existing            O10.019   (labetalol 200mg )   Obesity complicating pregnancy, third          O99.213   trimester   Asthma (albuterol)                             O99.89 j45.909   [redacted] weeks gestation of pregnancy                 Z3A.36  ---------------------------------------------------------------------- Vital Signs                                                 Height:        5'7" ---------------------------------------------------------------------- Fetal Evaluation  Num Of Fetuses:         1  Fetal Heart Rate(bpm):  143  Cardiac Activity:       Observed  Presentation:           Cephalic  Amniotic Fluid  AFI FV:      Within normal limits  AFI Sum(cm)     %Tile       Largest Pocket(cm)  8.02            9           4.62  RUQ(cm)       RLQ(cm)  4.62          3.4 ---------------------------------------------------------------------- Biophysical Evaluation  Amniotic F.V:   Within normal limits       F. Tone:        Observed  F. Movement:    Observed                   Score:          8/8  F. Breathing:   Observed ---------------------------------------------------------------------- OB History  Gravidity:    1 ---------------------------------------------------------------------- Gestational Age  LMP:           38w 4d        Date:  07/14/17                 EDD:   04/20/18  Best:          36w 6d     Det. ByMarcella Dubs         EDD:   05/02/18                                      (  09/05/17) ---------------------------------------------------------------------- Impression  Chronic hypertension. Well-controlled on labetalol.  Amniotic fluid is normal and good fetal activity is seen.  Antenatal testing is reassuring. BPP 8/8.  Patient will be undergoing induction of labor next week. ----------------------------------------------------------------------                  Noralee Space, MD Electronically Signed Final Report   04/10/2018 10:25 am ----------------------------------------------------------------------  [place] Mfm Fetal Bpp Wo Non Stress  Result Date: 04/03/2018 ----------------------------------------------------------------------  OBSTETRICS REPORT                       (Signed Final 04/03/2018 08:44 am)  ---------------------------------------------------------------------- Patient Info  ID #:       [id number]                          D.O.B.:  12/31/1990 (27 yrs)  Name:       LAUNI ASENCIO                  Visit Date: 04/03/2018 07:46 am ---------------------------------------------------------------------- Performed By  Performed By:     Birdena Crandall        Ref. Address:     [address]                                                             [place], [place]                                                             [address]  Attending:        Patsi Sears      Location:         [facility]                    MD  Referred By:      Hermina Staggers                    MD ---------------------------------------------------------------------- Orders   #  Description                          Code         Ordered By   1  [place] MFM FETAL BPP WO NON              19147.82     RAVI NFAOZHY      STRESS  ----------------------------------------------------------------------   #  Order #  Accession #                 Episode #   1  [id number]                  [id number]                  [id number]  ---------------------------------------------------------------------- Indications   Hypertension - Chronic/Pre-existing            O10.019   (labetalol 200mg )   Obesity complicating pregnancy, third          O99.213   trimester   Asthma (albuterol)                             O99.89 j45.909   [redacted] weeks gestation of pregnancy                Z3A.35  ---------------------------------------------------------------------- Vital Signs  Weight (lb): 257                               Height:        5'7"  BMI:         40.25 ---------------------------------------------------------------------- Fetal Evaluation  Num Of Fetuses:         1  Fetal Heart Rate(bpm):  146  Cardiac Activity:       Observed  Presentation:            Cephalic  Placenta:               Anterior  Amniotic Fluid  AFI FV:      Within normal limits  AFI Sum(cm)     %Tile       Largest Pocket(cm)  14.27           52          5.02  RUQ(cm)       RLQ(cm)       LUQ(cm)        LLQ(cm)  3.51          1.21          4.53           5.02 ---------------------------------------------------------------------- Biophysical Evaluation  Amniotic F.V:   Within normal limits       F. Tone:        Observed  F. Movement:    Observed                   Score:          8/8  F. Breathing:   Observed ---------------------------------------------------------------------- OB History  Gravidity:    1 ---------------------------------------------------------------------- Gestational Age  LMP:           37w 4d        Date:  07/14/17                 EDD:   04/20/18  Best:          35w 6d     Det. ByMarcella Dubs         EDD:   05/02/18                                      (09/05/17) ---------------------------------------------------------------------- Anatomy  Ventricles:  Appears normal         Kidneys:                Appear normal  Stomach:               Appears normal, left   Bladder:                Appears normal                         sided  Other:  Technicallly difficult due to advanced GA and maternal habitus.          Profile visualized. ---------------------------------------------------------------------- Comments  U/S images reviewed. Findings reviewed with patient.   No  evidence of fetal compromise is found on BPP today.  BP - 129/92.  Patient denies headaches, visual disturbance,  mid-epigastric or RUQ pain.  Questions answered.  10 minutes spent face to face with patient.  Recommendations: 1) Weekly BPP 2) Serial U/S every 4  weeks for fetal growt ---------------------------------------------------------------------- Recommendations  1) Weekly BPP 2) Serial U/S every 4 weeks for fetal growth ----------------------------------------------------------------------                Patsi Sears, MD Electronically Signed Final Report   04/03/2018 08:44 am ----------------------------------------------------------------------  [place] Mfm Fetal Bpp Wo Non Stress  Result Date: 03/24/2018 ----------------------------------------------------------------------  OBSTETRICS REPORT                        (Signed Final 03/24/2018 11:00 am) ---------------------------------------------------------------------- Patient Info  ID #:       [id number]                          D.O.B.:  1990-05-20 (27 yrs)  Name:       JAIA ALONGE                  Visit Date: 03/24/2018 09:38 am ---------------------------------------------------------------------- Performed By  Performed By:     Eden Lathe BS      Ref. Address:      [address]                    [facility]                                                              [address]                                                              [place], [place]                                                              [address]  Attending:        Lin Landsman      Location[facility]                    MD  Referred By:      Hermina StaggersMICHAEL L ERVIN                    MD ---------------------------------------------------------------------- Orders   #  Description                          Code         Ordered By   1  US MFM FETAL BPP WO NON              76819.01     ROBERT JACOBSON      STRESS  ----------------------------------------------------------------------   #  Order #                    Accession #                 Episode #   1  [id number]                  [phone]                  [id number]  ---------------------------------------------------------------------- Indications   [redacted] weeks gestation of pregnancy                Z3A.34   Hypertension - Chronic/Pre-existing            O10.019   (labetalol 200mg )   Obesity complicating pregnancy, third          O99.213   trimester   Asthma (albuterol)                             O99.89 j45.909   ---------------------------------------------------------------------- Vital Signs                                                 Height:        5'7" ---------------------------------------------------------------------- Fetal Evaluation  Num Of Fetuses:          1  Fetal Heart Rate(bpm):   129  Cardiac Activity:        Observed  Presentation:            Cephalic  Placenta:                Anterior  P. Cord Insertion:       Visualized  Amniotic Fluid  AFI FV:      Within normal limits  AFI Sum(cm)     %Tile       Largest Pocket(cm)  10              20          4.47  RUQ(cm)       RLQ(cm)       LUQ(cm)  4.47          4.04          1.49 ---------------------------------------------------------------------- Biophysical Evaluation  Amniotic F.V:   Within normal limits       F. Tone:         Observed  F. Movement:    Observed  Score:           8/8  F. Breathing:   Observed ---------------------------------------------------------------------- OB History  Gravidity:    1 ---------------------------------------------------------------------- Gestational Age  LMP:           36w 1d        Date:  07/14/17                 EDD:   04/20/18  Best:          34w 3d     Det. ByMarcella Dubs         EDD:   05/02/18                                      (09/05/17) ---------------------------------------------------------------------- Impression  Biophysical profile 8/8 ---------------------------------------------------------------------- Recommendations  Follow up growth and BPP scheduld on 03/27/2018 ----------------------------------------------------------------------               Lin Landsman, MD Electronically Signed Final Report   03/24/2018 11:00 am ----------------------------------------------------------------------  [place] Mfm Ob Follow Up  Result Date: 03/27/2018 ----------------------------------------------------------------------  OBSTETRICS REPORT                       (Signed Final  03/27/2018 09:59 am) ---------------------------------------------------------------------- Patient Info  ID #:       [id number]                          D.O.B.:  05/01/1990 (27 yrs)  Name:       DREAMA KUNA                  Visit Date: 03/27/2018 09:14 am ---------------------------------------------------------------------- Performed By  Performed By:     Lenise Arena        Ref. Address:     [address]                                                             [place], [place]                                                             [address]  Attending:        Blase Mess MD       Location:         [facility]  Referred By:      Hermina Staggers  MD ---------------------------------------------------------------------- Orders   #  Description                          Code         Ordered By   1  [place] MFM OB FOLLOW UP                  81275.17     Adele Dan  ----------------------------------------------------------------------   #  Order #                    Accession #                 Episode #   1  [id number]                  [id number]                  [id number]  ---------------------------------------------------------------------- Indications   Hypertension - Chronic/Pre-existing            O10.019   (labetalol 200mg )   Obesity complicating pregnancy, third          O99.213   trimester   Asthma (albuterol)                             O99.89 j45.909   [redacted] weeks gestation of pregnancy                Z3A.34  ---------------------------------------------------------------------- Vital Signs  Weight (lb): 251                               Height:        5'7"  BMI:         39.31 ---------------------------------------------------------------------- Fetal Evaluation  Num Of Fetuses:         1  Fetal Heart Rate(bpm):  151  Cardiac Activity:       Observed  Presentation:           Cephalic   Placenta:               Anterior  P. Cord Insertion:      Previously Visualized  Amniotic Fluid  AFI FV:      Within normal limits  AFI Sum(cm)     %Tile       Largest Pocket(cm)  8.44            9           3.21  RUQ(cm)       RLQ(cm)       LUQ(cm)        LLQ(cm)  3.21          2.84          0              2.39  Comment:    Good Movement and Breathing today ---------------------------------------------------------------------- Biometry  BPD:      85.6  mm     G. Age:  34w 4d         41  %    CI:        78.27   %    70 - 86  FL/HC:      21.8   %    20.1 - 22.3  HC:      306.1  mm     G. Age:  34w 1d          8  %    HC/AC:      1.03        0.93 - 1.11  AC:      298.6  mm     G. Age:  33w 6d         27  %    FL/BPD:     77.9   %    71 - 87  FL:       66.7  mm     G. Age:  34w 2d         28  %    FL/AC:      22.3   %    20 - 24  Est. FW:    2340  gm      5 lb 3 oz     45  % ---------------------------------------------------------------------- OB History  Gravidity:    1 ---------------------------------------------------------------------- Gestational Age  LMP:           36w 4d        Date:  07/14/17                 EDD:   04/20/18  U/S Today:     34w 2d                                        EDD:   05/06/18  Best:          34w 6d     Det. ByMarcella Dubs         EDD:   05/02/18                                      (09/05/17) ---------------------------------------------------------------------- Anatomy  Cranium:               Appears normal         Aortic Arch:            Previously seen  Cavum:                 Appears normal         Ductal Arch:            Previously seen  Ventricles:            Appears normal         Diaphragm:              Appears normal  Choroid Plexus:        Previously seen        Stomach:                Appears normal, left  sided  Cerebellum:             Previously seen        Abdomen:                Appears normal  Posterior Fossa:       Previously seen        Abdominal Wall:         Previously seen  Nuchal Fold:           Previously seen        Cord Vessels:           Previously seen  Face:                  Orbits and profile     Kidneys:                Appear normal                         previously seen  Lips:                  Previously seen        Bladder:                Appears normal  Thoracic:              Appears normal         Spine:                  Previously seen  Heart:                 Appears normal         Upper Extremities:      Previously seen                         (4CH, axis, and situs  RVOT:                  Previously seen        Lower Extremities:      Previously seen  LVOT:                  Previously seen  Other:  Fetus appears to be a female prev seen. RT heel prev visualized.          Technically difficult due to maternal habitus and fetal position.  Nasal          bone visualized. ---------------------------------------------------------------------- Cervix Uterus Adnexa  Cervix  Not visualized (advanced GA >24wks) ---------------------------------------------------------------------- Impression  Live late preterm gestation with appropriately grown fetus and  normal amniotic fluid volume. ---------------------------------------------------------------------- Recommendations  Follow up in 1 week as scheduled.  Delivery at 38 weeks(unless otherwise indicated) due to  pharmacologically managed chronic hypertension. ----------------------------------------------------------------------                 Blase MessHenry Adekola, MD Electronically Signed Final Report   03/27/2018 09:59 am ----------------------------------------------------------------------   Assessment: Waymon BudgeBrooke K Funez is  28 y.o. G1P0000 at 6526w0d with history of Obesity, cHTN and HSV presents for Induction of Labor.   Plan:  #cHTN: No severe range pressures. Continue Labetalol  100 mg BID. Asymptomatic.  #HSV: No concern for outbreak or contraindication to proceed with vaginal delivery.  #Labor: Induction. Start with FB and Cytotec.  #Pain: Patient desires to labor without epidural.  #FWB: Cat I  #ID:  GBS neg  #  MOF: Breast  #MOC: Undecided  #Circ:  Yes, discuss inpatient vs. Outpatient   Dollene Cleveland, DO PGY-1 1/3/202010:48 AM   OB FELLOW HISTORY AND PHYSICAL ATTESTATION  I have seen and examined this patient; I agree with above documentation in the resident's note.   Marcy Siren, D.O. OB Fellow  04/18/2018, 11:47 AM

## 2018-04-18 NOTE — Progress Notes (Addendum)
LABOR PROGRESS NOTE  Bailey Carney is a 28 y.o. G1P0000 at [redacted]w[redacted]d admitted for induction of labor for hx of Chronic Hypertension and Comorbidity of Obesity.   Subjective: Patient is resting comfortably in bed. She states she is starting to feel more pressure in the suprapubic region of her abdomen. Otherwise she has no complaints or concerns. She reports positive fetal movement and denies leakage of fluid or vaginal bleeding.   Objective: BP 134/82   Pulse 83   Temp 98 F (36.7 C) (Axillary)   Resp 17   LMP 07/14/2017 (Exact Date)  or  Vitals:   04/18/18 1221 04/18/18 1324 04/18/18 1442 04/18/18 1513  BP: (!) 144/89 130/68 131/79 134/82  Pulse: 89 89 86 83  Resp: 16 18 18 17   Temp: 98 F (36.7 C)     TempSrc: Axillary      Dilation: 5.5 Effacement (%): 70 Cervical Position: Middle Station: -2, -1 Presentation: Vertex Exam by:: Devra Dopp, RN  FHT: baseline rate 135, moderate varibility, + acel, - decel Toco: Pitocin 56mL/hr Labs: Lab Results  Component Value Date   WBC 10.5 04/18/2018   HGB 10.8 (L) 04/18/2018   HCT 32.9 (L) 04/18/2018   MCV 90.1 04/18/2018   PLT 181 04/18/2018   Patient Active Problem List   Diagnosis Date Noted  . Gestational hypertension 04/18/2018  . Obesity in pregnancy 04/03/2018  . Acute cholecystitis 11/25/2017  . History of ELISA positive for HSV 10/11/2017  . Chronic hypertension during pregnancy, antepartum 09/06/2017  . Supervision of high risk pregnancy, antepartum 09/06/2017  . Asthma   . Hypertension    Assessment / Plan: 28 y.o. G1P0000 at [redacted]w[redacted]d here for Induction of Labor and is steadily progressing towards labor.  cHTN: No severe range pressures. Continue Labetalol 100mg  BID. Asymptomatic. HSV: No concern for outbreak or contraindication to proceed with vaginal delivery Labor: Induction. Cytotec stopped, Pitocin initiated 1513. Foley bulb removed spontaneously. Fetal Wellbeing:  Category I Pain Control:  Tylenol, patient  desires to labor without epidural Anticipated MOD:  NSVD Circ: desires outpatient Circumcision  Peggyann Shoals, DO 04/18/2018 3:38 PM

## 2018-04-18 NOTE — Discharge Summary (Signed)
Postpartum Discharge Summary    Patient Name: Bailey Carney DOB: 1990/11/14 MRN: [id number]  Date of admission: 04/18/2018 Delivering Provider: Cam Hai D   Date of discharge: 04/20/2018  Admitting diagnosis: 38WKS INDUCTION Intrauterine pregnancy: [redacted]w[redacted]d     Secondary diagnosis:  Active Problems:   Asthma   Chronic hypertension during pregnancy, antepartum   Supervision of high risk pregnancy, antepartum   History of ELISA positive for HSV   Acute cholecystitis   Obesity in pregnancy  Additional problems: none Discharge diagnosis: Term Pregnancy Delivered                                   Post partum procedures:none Augmentation: AROM, Pitocin, Cytotec and Foley Balloon Complications: None  Hospital course:  Induction of Labor With Vaginal Delivery   28 y.o. yo G1P0000 at [redacted]w[redacted]d was admitted to the hospital 04/18/2018 for induction of labor.  Indication for induction: cHTN and obesity. Pt underwent cx ripening, progressed well, and later that same evening patient had an uncomplicated delivery as follows: Membrane Rupture Time/Date: 7:47 PM ,04/18/2018   Intrapartum Procedures: Episiotomy: None [1]                                         Lacerations:  1st degree [2];Sulcus [9];Perineal [11]  Patient had delivery of a Viable infant.  Information for the patient's newborn:  Rafa, Domond [[id number]]  Delivery Method: Vaginal, Spontaneous(Filed from Delivery Summary)   Details of delivery can be found in separate delivery note.  Patient had a routine postpartum course. Patient is discharged home 04/20/18.  Magnesium Sulfate recieved: No BMZ received: No  Physical exam  Vitals:   04/19/18 1015 04/19/18 1500 04/19/18 2159 04/20/18 0541  BP: 125/77 133/77 122/77 122/75  Pulse: 89 90 78 77  Resp: 18 18 20 18   Temp: 98 F (36.7 C) 98 F (36.7 C) 98.2 F (36.8 C) 98 F (36.7 C)  TempSrc: Oral Oral Oral Oral  SpO2:       General: well-appearing, NAD Lochia:  appropriate Uterine Fundus: firm Incision: N/A DVT Evaluation: No significant calf/ankle edema. Labs: Lab Results  Component Value Date   WBC 19.3 (H) 04/19/2018   HGB 10.2 (L) 04/19/2018   HCT 30.3 (L) 04/19/2018   MCV 88.6 04/19/2018   PLT 184 04/19/2018   CMP Latest Ref Rng & Units 04/08/2018  Glucose 70 - 99 mg/dL 81  BUN 6 - 20 mg/dL 8  Creatinine 1.77 - 1.16 mg/dL 5.79  Sodium 038 - 333 mmol/L 133(L)  Potassium 3.5 - 5.1 mmol/L 3.8  Chloride 98 - 111 mmol/L 104  CO2 22 - 32 mmol/L 22  Calcium 8.9 - 10.3 mg/dL 8.3(A)  Total Protein 6.5 - 8.1 g/dL 6.6  Total Bilirubin 0.3 - 1.2 mg/dL 0.6  Alkaline Phos 38 - 126 U/L 124  AST 15 - 41 U/L 18  ALT 0 - 44 U/L 15    Discharge instruction: per After Visit Summary and "Baby and Me Booklet".  After visit meds:  Allergies as of 04/20/2018      Reactions   Amoxicillin Rash, Other (See Comments)   Has patient had a PCN reaction causing immediate rash, facial/tongue/throat swelling, SOB or lightheadedness with hypotension: No Has patient had a PCN reaction causing severe rash involving mucus membranes or skin necrosis:  No Has patient had a PCN reaction that required hospitalization No Has patient had a PCN reaction occurring within the last 10 years: No If all of the above answers are "NO", then may proceed with Cephalosporin use.      Medication List    STOP taking these medications   aspirin EC 81 MG tablet   labetalol 100 MG tablet Commonly known as:  NORMODYNE     TAKE these medications   albuterol 108 (90 Base) MCG/ACT inhaler Commonly known as:  PROVENTIL HFA;VENTOLIN HFA Inhale 1-2 puffs into the lungs every 6 (six) hours as needed for wheezing or shortness of breath.   amLODipine 5 MG tablet Commonly known as:  NORVASC Take 1 tablet (5 mg total) by mouth daily.   ibuprofen 800 MG tablet Commonly known as:  ADVIL,MOTRIN Take 1 tablet (800 mg total) by mouth 3 (three) times daily.   PRENATAL PO Take 1  tablet by mouth at bedtime.   senna-docusate 8.6-50 MG tablet Commonly known as:  Senokot-S Take 2 tablets by mouth at bedtime as needed for mild constipation.   valACYclovir 500 MG tablet Commonly known as:  VALTREX Take 1 tablet (500 mg total) by mouth 2 (two) times daily.      Diet: routine diet Activity: Advance as tolerated. Pelvic rest for 6 weeks.   Outpatient follow up:1wk BP check; 4wk PP visit Follow up Appt:No future appointments. Follow up Visit: Follow-up Information    [facility] Schedule an appointment as soon as possible for a visit.   Specialty:  Obstetrics and Gynecology Why:  You should receive a call to schedule a postpartum visit. If you do not receive a call, please make an appointment in 4-6 weeks by calling clinic.  Contact information: [address] [place] [id number] [phone]         Please schedule this patient for Postpartum visit in: 1 week for BP check, then 4 wks for PP visit with the following provider: Any provider For C/S patients schedule nurse incision check in weeks 2 weeks: no High risk pregnancy complicated by: HTN Delivery mode:  SVD Anticipated Birth Control:  other/unsure PP Procedures needed: BP check  Schedule Integrated BH visit: no  Newborn Data: Live born female  Birth Weight: 6 lb 3.1 oz (2810 g) APGAR: 8, 9  Newborn Delivery   Birth date/time:  04/18/2018 19:51:00 Delivery type:  Vaginal, Spontaneous    Baby Feeding: Both Disposition:home with mother  04/20/2018 Tamera StandsLaurel S Zerina Hallinan, DO

## 2018-04-18 NOTE — Anesthesia Procedure Notes (Signed)
Epidural Patient location during procedure: OB Start time: 04/18/2018 5:50 PM End time: 04/18/2018 6:10 PM  Staffing Anesthesiologist: Elmer Picker, MD Performed: anesthesiologist   Preanesthetic Checklist Completed: patient identified, pre-op evaluation, timeout performed, IV checked, risks and benefits discussed and monitors and equipment checked  Epidural Patient position: sitting Prep: site prepped and draped and DuraPrep Patient monitoring: continuous pulse ox, blood pressure, heart rate and cardiac monitor Approach: midline Location: L3-L4 Injection technique: LOR air  Needle:  Needle type: Tuohy  Needle gauge: 17 G Needle length: 9 cm Needle insertion depth: 10 cm Catheter type: closed end flexible Catheter size: 19 Gauge Catheter at skin depth: 16 cm Test dose: negative  Assessment Sensory level: T8 Events: blood not aspirated, injection not painful, no injection resistance, negative IV test and no paresthesia  Additional Notes Patient identified. Risks/Benefits/Options discussed with patient including but not limited to bleeding, infection, nerve damage, paralysis, failed block, incomplete pain control, headache, blood pressure changes, nausea, vomiting, reactions to medication both or allergic, itching and postpartum back pain. Confirmed with bedside nurse the patient's most recent platelet count. Confirmed with patient that they are not currently taking any anticoagulation, have any bleeding history or any family history of bleeding disorders. Patient expressed understanding and wished to proceed. All questions were answered. Sterile technique was used throughout the entire procedure. Please see nursing notes for vital signs. Test dose was given through epidural catheter and negative prior to continuing to dose epidural or start infusion. Warning signs of high block given to the patient including shortness of breath, tingling/numbness in hands, complete motor block, or  any concerning symptoms with instructions to call for help. Patient was given instructions on fall risk and not to get out of bed. All questions and concerns addressed with instructions to call with any issues or inadequate analgesia.  Reason for block:procedure for pain

## 2018-04-18 NOTE — Anesthesia Preprocedure Evaluation (Signed)
Anesthesia Evaluation  Patient identified by MRN, date of birth, ID band Patient awake    Reviewed: Allergy & Precautions, NPO status , Patient's Chart, lab work & pertinent test results, reviewed documented beta blocker date and time   Airway Mallampati: II  TM Distance: >3 FB Neck ROM: Full    Dental no notable dental hx. (+) Teeth Intact   Pulmonary asthma ,    Pulmonary exam normal breath sounds clear to auscultation       Cardiovascular hypertension, Pt. on medications and Pt. on home beta blockers negative cardio ROS Normal cardiovascular exam Rhythm:Regular Rate:Normal     Neuro/Psych negative neurological ROS  negative psych ROS   GI/Hepatic negative GI ROS, Neg liver ROS,   Endo/Other  Morbid obesity  Renal/GU negative Renal ROS  negative genitourinary   Musculoskeletal negative musculoskeletal ROS (+)   Abdominal   Peds  Hematology negative hematology ROS (+)   Anesthesia Other Findings   Reproductive/Obstetrics (+) Pregnancy                            Anesthesia Physical Anesthesia Plan  ASA: III  Anesthesia Plan: Epidural   Post-op Pain Management:    Induction:   PONV Risk Score and Plan: Treatment may vary due to age or medical condition  Airway Management Planned: Natural Airway  Additional Equipment:   Intra-op Plan:   Post-operative Plan:   Informed Consent: I have reviewed the patients History and Physical, chart, labs and discussed the procedure including the risks, benefits and alternatives for the proposed anesthesia with the patient or authorized representative who has indicated his/her understanding and acceptance.     Plan Discussed with: Anesthesiologist  Anesthesia Plan Comments: (Patient identified. Risks, benefits, options discussed with patient including but not limited to bleeding, infection, nerve damage, paralysis, failed block, incomplete  pain control, headache, blood pressure changes, nausea, vomiting, reactions to medication, itching, and post partum back pain. Confirmed with bedside nurse the patient's most recent platelet count. Confirmed with the patient that they are not taking any anticoagulation, have any bleeding history or any family history of bleeding disorders. Patient expressed understanding and wishes to proceed. All questions were answered. )        Anesthesia Quick Evaluation

## 2018-04-18 NOTE — Progress Notes (Signed)
Bailey HaiKimberly Shaw notified of repeat blood pressure. States she will put in orders for procardia instead of labetalol.

## 2018-04-19 ENCOUNTER — Encounter (HOSPITAL_COMMUNITY): Payer: Self-pay

## 2018-04-19 LAB — CBC
HCT: 30.3 % — ABNORMAL LOW (ref 36.0–46.0)
Hemoglobin: 10.2 g/dL — ABNORMAL LOW (ref 12.0–15.0)
MCH: 29.8 pg (ref 26.0–34.0)
MCHC: 33.7 g/dL (ref 30.0–36.0)
MCV: 88.6 fL (ref 80.0–100.0)
Platelets: 184 10*3/uL (ref 150–400)
RBC: 3.42 MIL/uL — ABNORMAL LOW (ref 3.87–5.11)
RDW: 13.4 % (ref 11.5–15.5)
WBC: 19.3 10*3/uL — ABNORMAL HIGH (ref 4.0–10.5)
nRBC: 0 % (ref 0.0–0.2)

## 2018-04-19 NOTE — Lactation Note (Signed)
This note was copied from a baby's chart. Lactation Consultation Note  Patient Name: Bailey Carney TRVUY'E Date: 04/19/2018   Per MD note mom has changed from breastfeeding to bottlefeeding.  Spoke with mom about pumping and offering breastmilk in a bottle .  Mom reports she doesn't want to do that.  Plans to stick with formula. Discussed ways to dry up milk.  Urged ice for 15 minutes every few hours even before starts to feel milk coming.  Discussed ice, and pumping to comfort.  Mom reports she knows about cabbage leaves.  Urged mom to follow up with lactation as needed.   Reviewed CDC guidelines and formula prep with mom..    Maternal Data    Feeding Feeding Type: Formula  LATCH Score                   Interventions    Lactation Tools Discussed/Used     Consult Status      Rutha Melgoza Michaelle Copas 04/19/2018, 12:21 PM

## 2018-04-19 NOTE — Anesthesia Postprocedure Evaluation (Signed)
Anesthesia Post Note  Patient: Waymon BudgeBrooke K Klayman  Procedure(s) Performed: AN AD HOC LABOR EPIDURAL     Patient location during evaluation: Mother Baby Anesthesia Type: Epidural Level of consciousness: awake and alert Pain management: pain level controlled Vital Signs Assessment: post-procedure vital signs reviewed and stable Respiratory status: spontaneous breathing, nonlabored ventilation and respiratory function stable Cardiovascular status: stable Postop Assessment: no headache, no backache, epidural receding, no apparent nausea or vomiting, patient able to bend at knees, able to ambulate and adequate PO intake Anesthetic complications: no    Last Vitals:  Vitals:   04/19/18 0300 04/19/18 0700  BP: 120/63 140/74  Pulse: 81 79  Resp: 18 18  Temp: 36.8 C   SpO2:  97%    Last Pain:  Vitals:   04/19/18 0700  TempSrc:   PainSc: 0-No pain   Pain Goal:                 [place]akes[place],Oleda Borski Hristova

## 2018-04-19 NOTE — Progress Notes (Signed)
Post Partum Day #1 Subjective: no complaints, up ad lib and tolerating PO; bottlefeeding going well; on Procardia XL 30; no s/s pre-e; unsure re contraception  Objective: Blood pressure 140/74, pulse 79, temperature 98.3 F (36.8 C), temperature source Oral, resp. rate 18, last menstrual period 07/14/2017, SpO2 97 %, unknown if currently breastfeeding.  Physical Exam:  General: alert, cooperative and no distress Lochia: appropriate Uterine Fundus: firm DVT Evaluation: No evidence of DVT seen on physical exam.  Recent Labs    04/18/18 2048 04/19/18 0542  HGB 10.6* 10.2*  HCT 32.4* 30.3*    Assessment/Plan: Plan for discharge tomorrow  Continue on Procardia XL 30   LOS: 1 day   Bailey Carney CNM 04/19/2018, 9:43 AM

## 2018-04-19 NOTE — Plan of Care (Signed)
Progressing appropriately. Encouraged to call for assistance as needed, and for LATCH assessment. Safety information and room orientation complete. 

## 2018-04-19 NOTE — Lactation Note (Signed)
This note was copied from a baby's chart. Lactation Consultation Note  Patient Name: Bailey Carney HWYSH'U Date: 04/19/2018 Reason for consult: Initial assessment;1st time breastfeeding;Early term 37-38.6wks P1, 4 hour female infant. Mom feeding choice at admission was breastfeeding . Per mom, she receives [facility] [employer]. Per mom, she doesn't want to latch infant to her breast she doesn't like it she has  Breastfeed infant twice since delivery. LC mention mom she is willing to help her latch infant to breast but mom declined. She prefers to pump only at this time and not latch infant to breast she is requesting formula instead. LC alerted Nurse of mom's current feeding choices of only pumping and wanting formula. LC discussed infant feeding behaviors to breast or bottle feed according to hunger cues and not exceed 3 hours without feeding infant.   Mom shown how to use DEBP & how to disassemble, clean, & reassemble parts. Mom will use DEBP every 3 hours on initial setting. LC discussed I & O. Reviewed Baby & Me book's Breastfeeding Basics.  LC discussed with mom if she changes her decision LC services is available to help assist in latching infant to breast.  Maternal Data Formula Feeding for Exclusion: No Has patient been taught Hand Expression?: Yes(Mom hand expressed 1 ml of colostrum that was given to infant on a spoon. ) Does the patient have breastfeeding experience prior to this delivery?: No  Feeding Feeding Type: Breast Fed  LATCH Score Latch: Repeated attempts needed to sustain latch, nipple held in mouth throughout feeding, stimulation needed to elicit sucking reflex.  Audible Swallowing: A few with stimulation  Type of Nipple: Everted at rest and after stimulation  Comfort (Breast/Nipple): Soft / non-tender  Hold (Positioning): Assistance needed to correctly position infant at breast and maintain latch.  LATCH Score: 7  Interventions Interventions: Breast  feeding basics reviewed;Hand express;DEBP  Lactation Tools Discussed/Used WIC Program: Yes Pump Review: Setup, frequency, and cleaning;Milk Storage Initiated by:: Danelle Earthly, IBCLC Date initiated:: 04/19/18   Consult Status Consult Status: Follow-up Date: 04/27/18 Follow-up type: In-patient    Danelle Earthly 04/19/2018, 12:17 AM

## 2018-04-20 MED ORDER — PNEUMOCOCCAL VAC POLYVALENT 25 MCG/0.5ML IJ INJ
0.5000 mL | INJECTION | Freq: Once | INTRAMUSCULAR | Status: DC
  Filled 2018-04-20: qty 0.5

## 2018-04-20 MED ORDER — AMLODIPINE BESYLATE 5 MG PO TABS: 5.0000 mg | ORAL_TABLET | Freq: Every day | ORAL | 0 refills | Status: DC

## 2018-04-20 MED ORDER — IBUPROFEN 800 MG PO TABS: 800.0000 mg | ORAL_TABLET | Freq: Three times a day (TID) | ORAL | 0 refills | Status: DC

## 2018-04-20 MED ORDER — PNEUMOCOCCAL VAC POLYVALENT 25 MCG/0.5ML IJ INJ: 0.5000 mL | INJECTION | INTRAMUSCULAR | Status: DC

## 2018-04-20 MED ORDER — SENNOSIDES-DOCUSATE SODIUM 8.6-50 MG PO TABS: 2.0000 | ORAL_TABLET | Freq: Every evening | ORAL | 0 refills | Status: DC | PRN

## 2018-04-20 NOTE — Plan of Care (Signed)
Progressing appropriately. Encouraged to call for assistance as needed.  

## 2018-04-21 ENCOUNTER — Telehealth: Payer: Self-pay | Admitting: Family Medicine

## 2018-04-21 NOTE — Telephone Encounter (Signed)
Called the patient and informed of upcoming appointments. Stated she would attend and also stated she did not receive an flu shot. Educated the patient I would place the note in her chart.

## 2018-04-25 ENCOUNTER — Ambulatory Visit: Payer: Self-pay

## 2018-05-01 ENCOUNTER — Ambulatory Visit (INDEPENDENT_AMBULATORY_CARE_PROVIDER_SITE_OTHER): Payer: Medicaid Other | Admitting: *Deleted

## 2018-05-01 DIAGNOSIS — I10 Essential (primary) hypertension: Secondary | ICD-10-CM

## 2018-05-01 NOTE — Progress Notes (Signed)
States had home visit nurse yesterday who did bp and states it was around 130/80. Explained to her we can not give pneumonia shot- she will need to see her PCP or asthma doctor. States does not have a PCP- informed her we will refer her to [facility]. She c/o headache about every other day and still has a little edema.  Reviewed today's bp , history and complaints with Dr. Earlene Plater and instructed patient to continue norvasc daily; to keep postpartum appointment as scheduled and to go to [facility] for c/o severe headache. Also discussed if continues to have mild headaches to call us .

## 2018-05-01 NOTE — Progress Notes (Signed)
Chart reviewed for nurse visit. Agree with plan of care.   Icy Fuhrmann Lauren, DO 05/01/2018 6:44 PM  

## 2018-05-05 ENCOUNTER — Other Ambulatory Visit: Payer: Self-pay

## 2018-05-05 ENCOUNTER — Inpatient Hospital Stay (HOSPITAL_COMMUNITY)
Admission: AD | Admit: 2018-05-05 | Discharge: 2018-05-05 | Disposition: A | Discharge status: Home / Self Care | Payer: Medicaid Other | Attending: Obstetrics and Gynecology | Admitting: Obstetrics and Gynecology

## 2018-05-05 ENCOUNTER — Encounter (HOSPITAL_COMMUNITY): Payer: Self-pay | Admitting: *Deleted

## 2018-05-05 DIAGNOSIS — G43009 Migraine without aura, not intractable, without status migrainosus: Secondary | ICD-10-CM | POA: Diagnosis not present

## 2018-05-05 DIAGNOSIS — O1003 Pre-existing essential hypertension complicating the puerperium: Secondary | ICD-10-CM | POA: Insufficient documentation

## 2018-05-05 DIAGNOSIS — R51 Headache: Secondary | ICD-10-CM | POA: Diagnosis present

## 2018-05-05 DIAGNOSIS — O9089 Other complications of the puerperium, not elsewhere classified: Secondary | ICD-10-CM | POA: Insufficient documentation

## 2018-05-05 DIAGNOSIS — O10919 Unspecified pre-existing hypertension complicating pregnancy, unspecified trimester: Secondary | ICD-10-CM

## 2018-05-05 LAB — COMPREHENSIVE METABOLIC PANEL WITH GFR
ALT: 17 U/L (ref 0–44)
AST: 19 U/L (ref 15–41)
Albumin: 3.9 g/dL (ref 3.5–5.0)
Alkaline Phosphatase: 70 U/L (ref 38–126)
Anion gap: 8 (ref 5–15)
BUN: 16 mg/dL (ref 6–20)
CO2: 31 mmol/L (ref 22–32)
Calcium: 9.1 mg/dL (ref 8.9–10.3)
Chloride: 105 mmol/L (ref 98–111)
Creatinine, Ser: 1.21 mg/dL — ABNORMAL HIGH (ref 0.44–1.00)
GFR calc Af Amer: >60 mL/min
GFR calc non Af Amer: >60 mL/min
Glucose, Bld: 87 mg/dL (ref 70–99)
Potassium: 4.2 mmol/L (ref 3.5–5.1)
Sodium: 144 mmol/L (ref 135–145)
Total Bilirubin: 1 mg/dL (ref 0.3–1.2)
Total Protein: 7.3 g/dL (ref 6.5–8.1)

## 2018-05-05 LAB — URINALYSIS, ROUTINE W REFLEX MICROSCOPIC
BACTERIA UA: NONE SEEN
BILIRUBIN URINE: NEGATIVE
Glucose, UA: NEGATIVE mg/dL
Ketones, ur: NEGATIVE mg/dL
Leukocytes, UA: NEGATIVE
Nitrite: NEGATIVE
Protein, ur: NEGATIVE mg/dL
Specific Gravity, Urine: 1.008 (ref 1.005–1.030)
pH: 6 (ref 5.0–8.0)

## 2018-05-05 LAB — CBC
HCT: 38.8 % (ref 36.0–46.0)
Hemoglobin: 12.3 g/dL (ref 12.0–15.0)
MCH: 28.7 pg (ref 26.0–34.0)
MCHC: 31.7 g/dL (ref 30.0–36.0)
MCV: 90.7 fL (ref 80.0–100.0)
Platelets: 286 10*3/uL (ref 150–400)
RBC: 4.28 MIL/uL (ref 3.87–5.11)
RDW: 12.9 % (ref 11.5–15.5)
WBC: 6.9 10*3/uL (ref 4.0–10.5)
nRBC: 0 % (ref 0.0–0.2)

## 2018-05-05 LAB — PROTEIN / CREATININE RATIO, URINE
Creatinine, Urine: 72 mg/dL
Total Protein, Urine: <6 mg/dL

## 2018-05-05 MED ORDER — LACTATED RINGERS IV SOLN
INTRAVENOUS | Status: DC
  Administered 2018-05-05: 16:00:00 via INTRAVENOUS

## 2018-05-05 MED ORDER — DEXAMETHASONE SODIUM PHOSPHATE 10 MG/ML IJ SOLN
10.0000 mg | Freq: Once | INTRAMUSCULAR | Status: AC
  Administered 2018-05-05: 10 mg via INTRAVENOUS
  Filled 2018-05-05: qty 1

## 2018-05-05 MED ORDER — DIPHENHYDRAMINE HCL 50 MG/ML IJ SOLN
25.0000 mg | Freq: Once | INTRAMUSCULAR | Status: AC
  Administered 2018-05-05: 25 mg via INTRAVENOUS
  Filled 2018-05-05: qty 1

## 2018-05-05 MED ORDER — METOCLOPRAMIDE HCL 5 MG/ML IJ SOLN
10.0000 mg | Freq: Once | INTRAMUSCULAR | Status: AC
  Administered 2018-05-05: 10 mg via INTRAVENOUS
  Filled 2018-05-05: qty 2

## 2018-05-05 MED ORDER — AMLODIPINE BESYLATE 10 MG PO TABS: 10.0000 mg | ORAL_TABLET | Freq: Every day | ORAL | 1 refills | Status: DC

## 2018-05-05 NOTE — MAU Provider Note (Addendum)
History     CSN: [id number]  Arrival date and time: 05/05/18 1433   First Provider Initiated Contact with Patient 05/05/18 1700      Chief Complaint  Patient presents with  . Headache  . Abdominal Pain   Bailey BudgeBrooke K Carney is a 28 y.o. G1P1001 s/p SVD 2 wks ago presenting with severe migraine (9/10). She has tried ibuprofen and tylenol without relief. She denies blurred vision, dizziness, or weakness, but is seeing spots with the headache. Hx of CHTN on Norvasc, her last dose was at 9:30pm last night. She has poor appetite but is drinking well. Admits to poor sleep d/t newborn.   She is also having bouts of RUQ pain and nausea that she says is consistent with what she experienced in August with her gallbladder exacerbation. She is not feeling that pain now, her last episode was last night.    OB History    Gravida  1   Para  1   Term  1   Preterm  0   AB  0   Living  1     SAB  0   TAB  0   Ectopic  0   Multiple  0   Live Births  1           Past Medical History:  Diagnosis Date  . Asthma    does not use inhaler often  . Chlamydia   . Cholecystitis   . Genital herpes   . Hypertension    on meds    Past Surgical History:  Procedure Laterality Date  . NO PAST SURGERIES      Family History  Problem Relation Age of Onset  . Hypertension Mother   . Breast cancer Maternal Grandmother   . Hypertension Maternal Grandmother   . Cancer Maternal Grandmother   . Breast cancer Paternal Grandmother   . Cancer Paternal Grandmother   . Cancer Maternal Grandfather     Social History   Tobacco Use  . Smoking status: Never Smoker  . Smokeless tobacco: Never Used  Substance Use Topics  . Alcohol use: Not Currently    Comment: socially prior to preg  . Drug use: No    Allergies:  Allergies  Allergen Reactions  . Amoxicillin Rash and Other (See Comments)    Has patient had a PCN reaction causing immediate rash, facial/tongue/throat swelling, SOB or  lightheadedness with hypotension: No Has patient had a PCN reaction causing severe rash involving mucus membranes or skin necrosis: No Has patient had a PCN reaction that required hospitalization No Has patient had a PCN reaction occurring within the last 10 years: No If all of the above answers are "NO", then may proceed with Cephalosporin use.     Medications Prior to Admission  Medication Sig Dispense Refill Last Dose  . acetaminophen (TYLENOL) 500 MG tablet Take 500 mg by mouth every 6 (six) hours as needed for headache.   05/04/2018 at Unknown time  . ibuprofen (ADVIL,MOTRIN) 800 MG tablet Take 1 tablet (800 mg total) by mouth 3 (three) times daily. 30 tablet 0 Past Week at Unknown time  . senna-docusate (SENOKOT-S) 8.6-50 MG tablet Take 2 tablets by mouth at bedtime as needed for mild constipation. 10 tablet 0 Past Week at Unknown time  . valACYclovir (VALTREX) 500 MG tablet Take 1 tablet (500 mg total) by mouth 2 (two) times daily. 30 tablet 6 Past Month at Unknown time  . [DISCONTINUED] amLODipine (NORVASC) 5 MG tablet Take  1 tablet (5 mg total) by mouth daily. 50 tablet 0 05/05/2018 at Unknown time  . albuterol (PROVENTIL HFA;VENTOLIN HFA) 108 (90 Base) MCG/ACT inhaler Inhale 1-2 puffs into the lungs every 6 (six) hours as needed for wheezing or shortness of breath. 1 Inhaler 0 Rescue    Review of Systems  Constitutional: Negative for appetite change, diaphoresis, fatigue and fever.  HENT: Negative for sinus pressure, sinus pain and sore throat.   Eyes: Positive for photophobia and visual disturbance (spots in vision, specifically in her upper peripheral). Negative for pain.  Respiratory: Negative for cough and shortness of breath.   Cardiovascular: Negative for chest pain.  Gastrointestinal: Negative for abdominal pain, constipation, diarrhea, nausea and vomiting.  Endocrine: Negative.   Genitourinary: Negative for difficulty urinating, pelvic pain and vaginal bleeding.   Musculoskeletal: Negative.   Skin: Negative.   Allergic/Immunologic: Negative.   Neurological: Positive for headaches (migraine). Negative for dizziness, syncope, facial asymmetry, weakness and light-headedness.  Hematological: Negative.   Psychiatric/Behavioral: Negative.    Physical Exam   Blood pressure (!) 143/86, pulse 65, temperature 98.1 F (36.7 C), temperature source Oral, resp. rate 18, weight 108.9 kg, SpO2 100 %, not currently breastfeeding.  Physical Exam  Constitutional: She is oriented to person, place, and time. She appears well-developed and well-nourished. No distress.  HENT:  Head: Normocephalic.  Eyes: Pupils are equal, round, and reactive to light.  Neck: Normal range of motion.  Cardiovascular: Normal rate, regular rhythm and normal heart sounds.  No murmur heard. Respiratory: Effort normal and breath sounds normal. No respiratory distress.  GI: Soft. Bowel sounds are normal. She exhibits no distension and no mass. There is no abdominal tenderness. There is no rebound and no guarding.  Musculoskeletal: Normal range of motion.        General: No tenderness or edema.  Neurological: She is alert and oriented to person, place, and time. No cranial nerve deficit.  Skin: Skin is warm and dry. She is not diaphoretic.  Psychiatric: She has a normal mood and affect. Her behavior is normal. Judgment and thought content normal.   Results for orders placed or performed during the hospital encounter of 05/05/18 (from the past 24 hour(s))  CBC     Status: None   Collection Time: 05/05/18  3:15 PM  Result Value Ref Range   WBC 6.9 4.0 - 10.5 K/uL   RBC 4.28 3.87 - 5.11 MIL/uL   Hemoglobin 12.3 12.0 - 15.0 g/dL   HCT 16.138.8 09.636.0 - 04.546.0 %   MCV 90.7 80.0 - 100.0 fL   MCH 28.7 26.0 - 34.0 pg   MCHC 31.7 30.0 - 36.0 g/dL   RDW 40.912.9 81.111.5 - 91.415.5 %   Platelets 286 150 - 400 K/uL   nRBC 0.0 0.0 - 0.2 %  Comprehensive metabolic panel     Status: Abnormal   Collection Time:  05/05/18  3:15 PM  Result Value Ref Range   Sodium 144 135 - 145 mmol/L   Potassium 4.2 3.5 - 5.1 mmol/L   Chloride 105 98 - 111 mmol/L   CO2 31 22 - 32 mmol/L   Glucose, Bld 87 70 - 99 mg/dL   BUN 16 6 - 20 mg/dL   Creatinine, Ser 7.821.21 (H) 0.44 - 1.00 mg/dL   Calcium 9.1 8.9 - 95.610.3 mg/dL   Total Protein 7.3 6.5 - 8.1 g/dL   Albumin 3.9 3.5 - 5.0 g/dL   AST 19 15 - 41 U/L   ALT 17 0 -  44 U/L   Alkaline Phosphatase 70 38 - 126 U/L   Total Bilirubin 1.0 0.3 - 1.2 mg/dL   GFR calc non Af Amer >60 >60 mL/min   GFR calc Af Amer >60 >60 mL/min   Anion gap 8 5 - 15  Protein / creatinine ratio, urine     Status: None   Collection Time: 05/05/18  3:19 PM  Result Value Ref Range   Creatinine, Urine 72.00 mg/dL   Total Protein, Urine <6 mg/dL   Protein Creatinine Ratio        0.00 - 0.15 mg/mg[Cre]  Urinalysis, Routine w reflex microscopic     Status: Abnormal   Collection Time: 05/05/18  3:19 PM  Result Value Ref Range   Color, Urine STRAW (A) YELLOW   APPearance CLEAR CLEAR   Specific Gravity, Urine 1.008 1.005 - 1.030   pH 6.0 5.0 - 8.0   Glucose, UA NEGATIVE NEGATIVE mg/dL   Hgb urine dipstick MODERATE (A) NEGATIVE   Bilirubin Urine NEGATIVE NEGATIVE   Ketones, ur NEGATIVE NEGATIVE mg/dL   Protein, ur NEGATIVE NEGATIVE mg/dL   Nitrite NEGATIVE NEGATIVE   Leukocytes, UA NEGATIVE NEGATIVE   RBC / HPF 0-5 0 - 5 RBC/hpf   WBC, UA 0-5 0 - 5 WBC/hpf   Bacteria, UA NONE SEEN NONE SEEN   Squamous Epithelial / LPF 0-5 0 - 5   MAU Course  Procedures  MDM CBC, CMP, urine Pr/Cr - increased creatinine, MD aware and recommended increased daily Norvasc and discharge to home Headache cocktail: Benadryl, Tylenol, Decadron, Reglan LR @ 181ml/hr  Assessment and Plan  Chronic hypertension - discharge to home with increased amlodipine  Bernerd Limbo, SNM 05/05/2018, 5:13 PM   CNM attestation:  I have seen and examined this patient and agree with above documentation in the  student's note.   Bailey Carney is a 28 y.o. G1P1001 2 weeks s/p SVD female reporting HA  Associated symptoms:  No blurry vision +spots, intermittently No RUQ pain No CP No SOB No edema  PE: Patient Vitals for the past 24 hrs:  BP Temp Temp src Pulse Resp SpO2 Weight  05/05/18 1646 (!) 143/86 - - 65 - - -  05/05/18 1631 130/76 - - (!) 54 - - -  05/05/18 1616 (!) 165/90 - - 72 - - -  05/05/18 1612 (!) 136/100 - - 67 - - -  05/05/18 1546 (!) 159/105 - - (!) 57 - - -  05/05/18 1531 (!) 145/79 - - 79 - - -  05/05/18 1516 (!) 135/91 - - 73 - - -  05/05/18 1457 (!) 136/93 - - 67 - - -  05/05/18 1442 (!) 137/93 98.1 F (36.7 C) Oral 81 18 100 % 108.9 kg   Gen: calm comfortable, NAD Resp: normal effort, no distress Heart: Regular rate Neuro: A&O x 4 Ext: no edema  ROS, labs, PMH reviewed  Orders Placed This Encounter  Procedures  . CBC  . Comprehensive metabolic panel  . Protein / creatinine ratio, urine  . Urinalysis, Routine w reflex microscopic  . Discharge patient   Meds ordered this encounter  Medications  . diphenhydrAMINE (BENADRYL) injection 25 mg  . dexamethasone (DECADRON) injection 10 mg  . metoCLOPramide (REGLAN) injection 10 mg  . lactated ringers infusion  . amLODipine (NORVASC) 10 MG tablet    Sig: Take 1 tablet (10 mg total) by mouth daily.    Dispense:  30 tablet    Refill:  1    Order Specific Question:   Supervising Provider    Answer:   Hermina Staggers [[id number]]   MDM Labs ordered and reviewed. HA resolved after cocktail. Consult with Dr. Alysia Penna, elevated Creatinine but could be d/t dehydration. Will increase Norvasc dose and arrange close f/u. Stable for discharge home.   Assessment 1. Migraine without aura and without status migrainosus, not intractable   2. Chronic pre-existing hypertension during pregnancy    Plan: - Discharge home in stable condition per consult with Hermina Staggers, MD. - PEC precautions. - Follow-up as at [facility] in 2  days for BP check - Return to maternity admissions symptoms worsen  I personally was present during the history, physical exam, and medical decision-making activities of this service and have verified that the service and findings are accurately documented in the student's note.  Donette Larry, CNM 05/05/2018 5:39 PM

## 2018-05-05 NOTE — MAU Note (Signed)
Has had migraines since she had her son. (1/13).  Started having pain off and on in RUQ since delivery.  Hx of hosp adm for gallbladder in Aug, this pain feels the same. Hx of HTN, med changed after delivery.

## 2018-05-05 NOTE — Discharge Instructions (Signed)

## 2018-05-07 ENCOUNTER — Ambulatory Visit (INDEPENDENT_AMBULATORY_CARE_PROVIDER_SITE_OTHER): Payer: Medicaid Other | Admitting: *Deleted

## 2018-05-07 VITALS — BP 132/79 | HR 66 | Ht 67.0 in | Wt 249.0 lb

## 2018-05-07 DIAGNOSIS — Z0130 Encounter for examination of blood pressure without abnormal findings: Secondary | ICD-10-CM

## 2018-05-07 NOTE — Progress Notes (Signed)
I have reviewed the chart and agree with nursing staff's documentation of this patient's encounter.  Fredderick Swanger, MD 05/07/2018 2:25 PM    

## 2018-05-07 NOTE — Progress Notes (Signed)
Pt states she has noticed a decrease in frequency of H/A's since her BP medication was increased on 1/20. She reports no H/A today. She occasionally sees spots, denies blurry vision and dizziness. Consult w/Dr. Macon Large and pt was advised to continue Amlodipine 10 mg daily. She should return to hospital if severe H/A's return or other sx of pre-eclampsia.  Pt has post partum check up on 2/3. Pt voiced understanding and had no questions.

## 2018-05-14 DIAGNOSIS — Z029 Encounter for administrative examinations, unspecified: Secondary | ICD-10-CM

## 2018-05-19 ENCOUNTER — Encounter: Payer: Self-pay | Admitting: Advanced Practice Midwife

## 2018-05-19 ENCOUNTER — Ambulatory Visit (INDEPENDENT_AMBULATORY_CARE_PROVIDER_SITE_OTHER): Payer: Medicaid Other | Admitting: Advanced Practice Midwife

## 2018-05-19 DIAGNOSIS — Z1389 Encounter for screening for other disorder: Secondary | ICD-10-CM

## 2018-05-19 DIAGNOSIS — O10919 Unspecified pre-existing hypertension complicating pregnancy, unspecified trimester: Secondary | ICD-10-CM

## 2018-05-19 DIAGNOSIS — Z3009 Encounter for other general counseling and advice on contraception: Secondary | ICD-10-CM

## 2018-05-19 NOTE — Progress Notes (Signed)
Subjective:     Bailey Carney is a 28 y.o. female who presents for a postpartum visit. She is 4 weeks postpartum following a spontaneous vaginal delivery. I have fully reviewed the prenatal and intrapartum course. The delivery was at 38 gestational weeks. Outcome: spontaneous vaginal delivery. Anesthesia: epidural. Postpartum course has been uncomplicated. Baby's course has been unremarkable. Baby is feeding by bottle - Gerber HA. Bleeding no bleeding. Bowel function is normal. Bladder function is normal. Patient is sexually active. Contraception method is condoms. Postpartum depression screening: negative.  The following portions of the patient's history were reviewed and updated as appropriate: allergies, current medications, past family history, past medical history, past social history, past surgical history and problem list.  Review of Systems Pertinent items are noted in HPI.   Objective:    BP 132/82   Pulse 79   Ht 5\' 6"  (1.676 m)   Wt 239 lb 14.4 oz (108.8 kg)   Breastfeeding No   BMI 38.72 kg/m    Physical Exam  Constitutional: She is oriented to person, place, and time and well-developed, well-nourished, and in no distress.  HENT:  Head: Normocephalic.  Cardiovascular: Normal rate.  Pulmonary/Chest: Effort normal.  Abdominal: Soft. There is no abdominal tenderness. There is no rebound.  Neurological: She is alert and oriented to person, place, and time.  Skin: Skin is warm and dry.  Psychiatric: Affect normal.  Nursing note and vitals reviewed.   Reviewed with the patient all forms of birth control options available including abstinence; over the counter/barrier methods; hormonal contraceptive medication including pill, patch, ring, Depo-Provera injection, Nexplanon; Mirena/Liletta and Paragard IUDs; permanent sterilization options including vasectomy, tubal ligation. Risks and benefits reviewed.  Questions were answered.  Information was given to patient to review.    Assessment:   1. Postpartum care and examination   2. General counselling and advice on contraception   3. Chronic hypertension affecting pregnancy    Plan:  - Continue norvasc at this time, and FU with PCP for further BP management.  - Reviewed BC options and given info on IUDs - FU PRN or 1 year for annual   Thressa Sheller DNP, [place]  05/19/18  2:39 PM

## 2018-05-19 NOTE — Patient Instructions (Signed)
Contraception Choices  Contraception, also called birth control, refers to methods or devices that prevent pregnancy.  Hormonal methods  Contraceptive implant    A contraceptive implant is a thin, plastic tube that contains a hormone. It is inserted into the upper part of the arm. It can remain in place for up to 3 years.  Progestin-only injections  Progestin-only injections are injections of progestin, a synthetic form of the hormone progesterone. They are given every 3 months by a health care provider.  Birth control pills    Birth control pills are pills that contain hormones that prevent pregnancy. They must be taken once a day, preferably at the same time each day.  Birth control patch    The birth control patch contains hormones that prevent pregnancy. It is placed on the skin and must be changed once a week for three weeks and removed on the fourth week. A prescription is needed to use this method of contraception.  Vaginal ring    A vaginal ring contains hormones that prevent pregnancy. It is placed in the vagina for three weeks and removed on the fourth week. After that, the process is repeated with a new ring. A prescription is needed to use this method of contraception.  Emergency contraceptive  Emergency contraceptives prevent pregnancy after unprotected sex. They come in pill form and can be taken up to 5 days after sex. They work best the sooner they are taken after having sex. Most emergency contraceptives are available without a prescription. This method should not be used as your only form of birth control.  Barrier methods  Female condom    A female condom is a thin sheath that is worn over the penis during sex. Condoms keep sperm from going inside a woman's body. They can be used with a spermicide to increase their effectiveness. They should be disposed after a single use.  Female condom    A female condom is a soft, loose-fitting sheath that is put into the vagina before sex. The condom keeps sperm  from going inside a woman's body. They should be disposed after a single use.  Diaphragm    A diaphragm is a soft, dome-shaped barrier. It is inserted into the vagina before sex, along with a spermicide. The diaphragm blocks sperm from entering the uterus, and the spermicide kills sperm. A diaphragm should be left in the vagina for 6-8 hours after sex and removed within 24 hours.  A diaphragm is prescribed and fitted by a health care provider. A diaphragm should be replaced every 1-2 years, after giving birth, after gaining more than 15 lb (6.8 kg), and after pelvic surgery.  Cervical cap    A cervical cap is a round, soft latex or plastic cup that fits over the cervix. It is inserted into the vagina before sex, along with spermicide. It blocks sperm from entering the uterus. The cap should be left in place for 6-8 hours after sex and removed within 48 hours. A cervical cap must be prescribed and fitted by a health care provider. It should be replaced every 2 years.  Sponge    A sponge is a soft, circular piece of polyurethane foam with spermicide on it. The sponge helps block sperm from entering the uterus, and the spermicide kills sperm. To use it, you make it wet and then insert it into the vagina. It should be inserted before sex, left in for at least 6 hours after sex, and removed and thrown away within   30 hours.  Spermicides  Spermicides are chemicals that kill or block sperm from entering the cervix and uterus. They can come as a cream, jelly, suppository, foam, or tablet. A spermicide should be inserted into the vagina with an applicator at least 10-15 minutes before sex to allow time for it to work. The process must be repeated every time you have sex. Spermicides do not require a prescription.  Intrauterine contraception  Intrauterine device (IUD)  An IUD is a T-shaped device that is put in a woman's uterus. There are two types:   Hormone IUD.This type contains progestin, a synthetic form of the hormone  progesterone. This type can stay in place for 3-5 years.   Copper IUD.This type is wrapped in copper wire. It can stay in place for 10 years.    Permanent methods of contraception  Female tubal ligation  In this method, a woman's fallopian tubes are sealed, tied, or blocked during surgery to prevent eggs from traveling to the uterus.  Hysteroscopic sterilization  In this method, a small, flexible insert is placed into each fallopian tube. The inserts cause scar tissue to form in the fallopian tubes and block them, so sperm cannot reach an egg. The procedure takes about 3 months to be effective. Another form of birth control must be used during those 3 months.  Female sterilization  This is a procedure to tie off the tubes that carry sperm (vasectomy). After the procedure, the man can still ejaculate fluid (semen).  Natural planning methods  Natural family planning  In this method, a couple does not have sex on days when the woman could become pregnant.  Calendar method  This means keeping track of the length of each menstrual cycle, identifying the days when pregnancy can happen, and not having sex on those days.  Ovulation method  In this method, a couple avoids sex during ovulation.  Symptothermal method  This method involves not having sex during ovulation. The woman typically checks for ovulation by watching changes in her temperature and in the consistency of cervical mucus.  Post-ovulation method  In this method, a couple waits to have sex until after ovulation.  Summary   Contraception, also called birth control, means methods or devices that prevent pregnancy.   Hormonal methods of contraception include implants, injections, pills, patches, vaginal rings, and emergency contraceptives.   Barrier methods of contraception can include female condoms, female condoms, diaphragms, cervical caps, sponges, and spermicides.   There are two types of IUDs (intrauterine devices). An IUD can be put in a woman's uterus to  prevent pregnancy for 3-5 years.   Permanent sterilization can be done through a procedure for males, females, or both.   Natural family planning methods involve not having sex on days when the woman could become pregnant.  This information is not intended to replace advice given to you by your health care provider. Make sure you discuss any questions you have with your health care provider.  Document Released: 04/02/2005 Document Revised: 04/04/2017 Document Reviewed: 05/05/2016  [employer]vier Interactive Patient Education  2019 [employer]

## 2018-05-19 NOTE — Progress Notes (Signed)
Has been having headaches since delivery date, stopped for awhile but has come back. Has not taken any thing for the headaches.

## 2018-06-02 ENCOUNTER — Encounter: Payer: Self-pay | Admitting: Family Medicine

## 2018-06-23 ENCOUNTER — Ambulatory Visit: Payer: Self-pay | Admitting: Advanced Practice Midwife

## 2018-07-04 ENCOUNTER — Encounter: Payer: Self-pay | Admitting: *Deleted

## 2018-07-16 ENCOUNTER — Other Ambulatory Visit: Payer: Self-pay

## 2018-07-16 ENCOUNTER — Encounter (HOSPITAL_COMMUNITY): Payer: Self-pay | Admitting: Emergency Medicine

## 2018-07-16 ENCOUNTER — Emergency Department (HOSPITAL_COMMUNITY)
Admission: EM | Admit: 2018-07-16 | Discharge: 2018-07-16 | Disposition: A | Discharge status: Home / Self Care | Payer: Medicaid Other | Attending: Emergency Medicine | Admitting: Emergency Medicine

## 2018-07-16 DIAGNOSIS — I10 Essential (primary) hypertension: Secondary | ICD-10-CM | POA: Insufficient documentation

## 2018-07-16 DIAGNOSIS — J45909 Unspecified asthma, uncomplicated: Secondary | ICD-10-CM | POA: Insufficient documentation

## 2018-07-16 DIAGNOSIS — R1030 Lower abdominal pain, unspecified: Secondary | ICD-10-CM | POA: Diagnosis not present

## 2018-07-16 DIAGNOSIS — M545 Low back pain, unspecified: Secondary | ICD-10-CM

## 2018-07-16 DIAGNOSIS — N939 Abnormal uterine and vaginal bleeding, unspecified: Secondary | ICD-10-CM | POA: Diagnosis not present

## 2018-07-16 LAB — WET PREP, GENITAL
Sperm: NONE SEEN
Trich, Wet Prep: NONE SEEN
Yeast Wet Prep HPF POC: NONE SEEN

## 2018-07-16 LAB — CBC WITH DIFFERENTIAL/PLATELET
Abs Immature Granulocytes: 0.02 10*3/uL (ref 0.00–0.07)
Basophils Absolute: 0 10*3/uL (ref 0.0–0.1)
Basophils Relative: 0 %
Eosinophils Absolute: 0 10*3/uL (ref 0.0–0.5)
Eosinophils Relative: 1 %
HCT: 38.5 % (ref 36.0–46.0)
Hemoglobin: 11.7 g/dL — ABNORMAL LOW (ref 12.0–15.0)
Immature Granulocytes: 0 %
Lymphocytes Relative: 28 %
Lymphs Abs: 2.4 10*3/uL (ref 0.7–4.0)
MCH: 25.9 pg — ABNORMAL LOW (ref 26.0–34.0)
MCHC: 30.4 g/dL (ref 30.0–36.0)
MCV: 85.2 fL (ref 80.0–100.0)
Monocytes Absolute: 0.4 10*3/uL (ref 0.1–1.0)
Monocytes Relative: 4 %
Neutro Abs: 5.6 10*3/uL (ref 1.7–7.7)
Neutrophils Relative %: 67 %
Platelets: 216 10*3/uL (ref 150–400)
RBC: 4.52 MIL/uL (ref 3.87–5.11)
RDW: 13.3 % (ref 11.5–15.5)
WBC: 8.5 10*3/uL (ref 4.0–10.5)
nRBC: 0 % (ref 0.0–0.2)

## 2018-07-16 LAB — I-STAT BETA HCG BLOOD, ED (MC, WL, AP ONLY): I-stat hCG, quantitative: <5 m[IU]/mL (ref <5)

## 2018-07-16 LAB — COMPREHENSIVE METABOLIC PANEL
ALT: 21 U/L (ref 0–44)
AST: 23 U/L (ref 15–41)
Albumin: 3.8 g/dL (ref 3.5–5.0)
Alkaline Phosphatase: 54 U/L (ref 38–126)
Anion gap: 6 (ref 5–15)
BUN: 11 mg/dL (ref 6–20)
CO2: 29 mmol/L (ref 22–32)
Calcium: 9.2 mg/dL (ref 8.9–10.3)
Chloride: 103 mmol/L (ref 98–111)
Creatinine, Ser: 0.92 mg/dL (ref 0.44–1.00)
GFR calc Af Amer: >60 mL/min (ref >60)
GFR calc non Af Amer: >60 mL/min (ref >60)
Glucose, Bld: 110 mg/dL — ABNORMAL HIGH (ref 70–99)
Potassium: 4.1 mmol/L (ref 3.5–5.1)
Sodium: 138 mmol/L (ref 135–145)
Total Bilirubin: 1 mg/dL (ref 0.3–1.2)
Total Protein: 7 g/dL (ref 6.5–8.1)

## 2018-07-16 LAB — LIPASE, BLOOD: Lipase: 22 U/L (ref 11–51)

## 2018-07-16 MED ORDER — IBUPROFEN 800 MG PO TABS: 800.0000 mg | ORAL_TABLET | Freq: Three times a day (TID) | ORAL | 0 refills | Status: DC | PRN

## 2018-07-16 NOTE — ED Provider Notes (Addendum)
[facility] EMERGENCY DEPARTMENT Provider Note   CSN: [id number] Arrival date & time: 07/16/18  1512    History   Chief Complaint Chief Complaint  Patient presents with  . Abdominal Pain    HPI Bailey Carney is a 28 y.o. female.     28 year old female with past medical history including asthma, hypertension, STD, cholecystitis who presents with abdominal pain.  Patient reports that she has had 2 days of constant, crampy lower abdominal pain that is across her entire lower abdomen and wraps around to both sides of her back.  The pain also radiates down both legs.  Pain is worse with prolonged sitting.  She denies any associated urinary symptoms, vaginal discharge, diarrhea, constipation, nausea, or vomiting.  She started having some mild vaginal bleeding when she arrived to the ED.  She is sexually active with one partner.  She had an uncomplicated vaginal delivery back in January and has not yet had a period since that time.  She reports not eating as much but has been tolerating liquids fine.  Nothing seems to make her symptoms better or worse.  She tried an 800 mg ibuprofen without much relief. No back injury or change in physical activity.  The history is provided by the patient.  Abdominal Pain    Past Medical History:  Diagnosis Date  . Asthma    does not use inhaler often  . Chlamydia   . Cholecystitis   . Genital herpes   . Hypertension    on meds    Patient Active Problem List   Diagnosis Date Noted  . Obesity in pregnancy 04/03/2018  . Acute cholecystitis 11/25/2017  . History of ELISA positive for HSV 10/11/2017  . Chronic hypertension during pregnancy, antepartum 09/06/2017  . Supervision of high risk pregnancy, antepartum 09/06/2017  . Asthma   . Hypertension     Past Surgical History:  Procedure Laterality Date  . NO PAST SURGERIES       OB History    Gravida  1   Para  1   Term  1   Preterm  0   AB  0   Living  1     SAB  0   TAB  0   Ectopic  0   Multiple  0   Live Births  1            Home Medications    Prior to Admission medications   Medication Sig Start Date End Date Taking? Authorizing Provider  albuterol (PROVENTIL HFA;VENTOLIN HFA) 108 (90 Base) MCG/ACT inhaler Inhale 1-2 puffs into the lungs every 6 (six) hours as needed for wheezing or shortness of breath. Patient not taking: Reported on 05/07/2018 11/14/17   Currie Paris, NP  amLODipine (NORVASC) 10 MG tablet Take 1 tablet (10 mg total) by mouth daily. Patient not taking: Reported on 07/16/2018 05/05/18 07/04/18  Donette Larry, CNM  ibuprofen (ADVIL,MOTRIN) 800 MG tablet Take 1 tablet (800 mg total) by mouth every 8 (eight) hours as needed for moderate pain or cramping. 07/16/18   , Ambrose Finland, MD  senna-docusate (SENOKOT-S) 8.6-50 MG tablet Take 2 tablets by mouth at bedtime as needed for mild constipation. Patient not taking: Reported on 05/07/2018 04/20/18   Tamera Stands, DO  valACYclovir (VALTREX) 500 MG tablet Take 1 tablet (500 mg total) by mouth 2 (two) times daily. Patient not taking: Reported on 05/07/2018 03/20/18   Allie Bossier, MD    Family  History Family History  Problem Relation Age of Onset  . Hypertension Mother   . Breast cancer Maternal Grandmother   . Hypertension Maternal Grandmother   . Cancer Maternal Grandmother   . Breast cancer Paternal Grandmother   . Cancer Paternal Grandmother   . Cancer Maternal Grandfather     Social History Social History   Tobacco Use  . Smoking status: Never Smoker  . Smokeless tobacco: Never Used  Substance Use Topics  . Alcohol use: Yes    Comment: socially  . Drug use: No     Allergies   Amoxicillin   Review of Systems Review of Systems  Gastrointestinal: Positive for abdominal pain.     Physical Exam Updated Vital Signs BP (!) 132/91   Pulse 83   Temp 98.7 F (37.1 C) (Oral)   Resp 15   Ht 5\' 6"  (1.676 m)   Wt 104.3 kg   SpO2 100%    BMI 37.12 kg/m   Physical Exam Vitals signs and nursing note reviewed. Exam conducted with a chaperone present.  Constitutional:      General: She is not in acute distress.    Appearance: She is well-developed.  HENT:     Head: Normocephalic and atraumatic.  Eyes:     Conjunctiva/sclera: Conjunctivae normal.  Neck:     Musculoskeletal: Neck supple.  Cardiovascular:     Rate and Rhythm: Normal rate and regular rhythm.     Heart sounds: Normal heart sounds. No murmur.  Pulmonary:     Effort: Pulmonary effort is normal.     Breath sounds: Normal breath sounds.  Abdominal:     General: Bowel sounds are normal. There is no distension.     Palpations: Abdomen is soft.     Tenderness: There is no abdominal tenderness.  Genitourinary:    Comments: Small amount of blood in vaginal vault and cervical os, no CMT or adnexal tenderness Musculoskeletal:     Comments: No midline spinal tenderness; mild b/l lower paraspinal muscle tenderness just above buttocks  Skin:    General: Skin is warm and dry.  Neurological:     Mental Status: She is alert and oriented to person, place, and time.     Comments: Fluent speech  Psychiatric:        Mood and Affect: Mood normal.        Judgment: Judgment normal.      ED Treatments / Results  Labs (all labs ordered are listed, but only abnormal results are displayed) Labs Reviewed  WET PREP, GENITAL - Abnormal; Notable for the following components:      Result Value   Clue Cells Wet Prep HPF POC PRESENT (*)    WBC, Wet Prep HPF POC MANY (*)    All other components within normal limits  COMPREHENSIVE METABOLIC PANEL - Abnormal; Notable for the following components:   Glucose, Bld 110 (*)    All other components within normal limits  CBC WITH DIFFERENTIAL/PLATELET - Abnormal; Notable for the following components:   Hemoglobin 11.7 (*)    MCH 25.9 (*)    All other components within normal limits  LIPASE, BLOOD  URINALYSIS, ROUTINE W REFLEX  MICROSCOPIC  I-STAT BETA HCG BLOOD, ED ([facility], [facility], [facility] ONLY)  GC/CHLAMYDIA PROBE AMP ([place]) NOT AT [facility]    EKG None  Radiology No results found.  Procedures Procedures (including critical care time)  Medications Ordered in ED Medications - No data to display   Initial Impression / Assessment  and Plan / ED Course  I have reviewed the triage vital signs and the nursing notes.  Pertinent labs that were available during my care of the patient were reviewed by me and considered in my medical decision making (see chart for details).        Well appearing, afebrile. No focal abdominal tenderness. Mild vaginal bleeding on exam but no signs/sx of PID. UPT negative. Has h/o cholecystitis but denies upper abdominal pain and LFTs are normal. Doubt that today's sx are related to gallbladder. Some of her symptoms suggest lumbar muscle strain esp given pain worse with sitting and radiating down legs. No signs/sx of cauda equina or infection. Labs show normal CMP, CBC, and lipase. Wet prep shows clue cells but she had vaginal bleeding without discharge therefore will hold off on flagyl for now. I was awaiting UA when I was contacted by staff to state that patient wanted to leave. She had not provided urine sample but was unwilling to stay for it. I explained that without the UA, I cannot rule out infection. She voiced understanding of risks.   Discussed supportive measures including ibuprofen, muscle relaxant, heat therapy, lidocaine patches. Return precautions reviewed and she voiced understanding.  Of note, patient left without discharge papers or motrin Rx.  Final Clinical Impressions(s) / ED Diagnoses   Final diagnoses:  Lower abdominal pain  Vaginal bleeding  Acute bilateral low back pain, unspecified whether sciatica present    ED Discharge Orders         Ordered    ibuprofen (ADVIL,MOTRIN) 800 MG tablet  Every 8 hours PRN     07/16/18 1758           , Ambrose Finland,  MD 07/16/18 1800    , Ambrose Finland, MD 07/16/18 1948

## 2018-07-16 NOTE — ED Notes (Addendum)
Pt seen walking out of exam room to the waiting room after talking to Dr. Clarene Duke. Pt did not wait for discharge papers.

## 2018-07-16 NOTE — ED Notes (Signed)
Patient verbalizes understanding of discharge instructions. Opportunity for questioning and answers were provided. Armband removed by staff, pt discharged from ED ambulatory to home.  

## 2018-07-16 NOTE — ED Notes (Addendum)
Pt walked out of exam room and to the nurses station stating that she needs to leave and pick up her son from daycare because it closes at 6 p.m. Dr. Clarene Duke was notified and went to talk to patient.

## 2018-07-16 NOTE — ED Triage Notes (Signed)
Pt states she had a baby in January of this year. Pt states she has been having bad lower abd cramping that wraps around her back since Sunday. Pt has not started her period yet, is not breast feeding. Pt states she is sexually active. Denies any urinary symptoms.

## 2018-07-17 LAB — GC/CHLAMYDIA PROBE AMP (~~LOC~~) NOT AT ARMC
Chlamydia: NEGATIVE
Neisseria Gonorrhea: NEGATIVE

## 2018-07-31 ENCOUNTER — Encounter (HOSPITAL_COMMUNITY): Payer: Self-pay | Admitting: Emergency Medicine

## 2018-07-31 ENCOUNTER — Emergency Department (HOSPITAL_COMMUNITY)
Admission: EM | Admit: 2018-07-31 | Discharge: 2018-08-01 | Disposition: A | Discharge status: Home / Self Care | Payer: Medicaid Other | Attending: Emergency Medicine | Admitting: Emergency Medicine

## 2018-07-31 ENCOUNTER — Emergency Department (HOSPITAL_COMMUNITY): Payer: Medicaid Other

## 2018-07-31 ENCOUNTER — Telehealth: Payer: Medicaid Other | Admitting: Physician Assistant

## 2018-07-31 ENCOUNTER — Other Ambulatory Visit: Payer: Self-pay

## 2018-07-31 ENCOUNTER — Ambulatory Visit: Payer: Self-pay | Admitting: *Deleted

## 2018-07-31 DIAGNOSIS — Z8709 Personal history of other diseases of the respiratory system: Secondary | ICD-10-CM | POA: Diagnosis not present

## 2018-07-31 DIAGNOSIS — R6889 Other general symptoms and signs: Principal | ICD-10-CM

## 2018-07-31 DIAGNOSIS — R05 Cough: Secondary | ICD-10-CM | POA: Insufficient documentation

## 2018-07-31 DIAGNOSIS — B349 Viral infection, unspecified: Secondary | ICD-10-CM | POA: Insufficient documentation

## 2018-07-31 DIAGNOSIS — J029 Acute pharyngitis, unspecified: Secondary | ICD-10-CM | POA: Diagnosis not present

## 2018-07-31 DIAGNOSIS — Z20822 Contact with and (suspected) exposure to covid-19: Secondary | ICD-10-CM

## 2018-07-31 DIAGNOSIS — M7918 Myalgia, other site: Secondary | ICD-10-CM | POA: Diagnosis not present

## 2018-07-31 DIAGNOSIS — I10 Essential (primary) hypertension: Secondary | ICD-10-CM | POA: Diagnosis not present

## 2018-07-31 DIAGNOSIS — R0602 Shortness of breath: Secondary | ICD-10-CM | POA: Insufficient documentation

## 2018-07-31 DIAGNOSIS — R509 Fever, unspecified: Secondary | ICD-10-CM | POA: Diagnosis present

## 2018-07-31 DIAGNOSIS — R0600 Dyspnea, unspecified: Secondary | ICD-10-CM

## 2018-07-31 NOTE — Progress Notes (Signed)
  E-Visit for Corona Virus Screening  Based on what you have shared with me, you need to seek an evaluation for a severe illness that is causing your symptoms which may be coronavirus or some other illness. I recommend that you be seen and evaluated "face to face". Our Emergency Departments are best equipped to handle patients with severe symptoms.   I recommend the following:  . If you are having a true medical emergency please call 911. . If you are considered high risk for Corona virus because of a known exposure, fever, shortness of breath and cough, OR if you have severe symptoms of any kind, seek medical care at an emergency room.  . Please call ahead and tell them that you were seen by telemedicine and they have recommended that you have a face to face evaluation. . [facility] Emergency Department [address], [place], [place] [address] [phone]  . [facility] [place] Emergency Department [address], [place], [place] [address] [phone]  . [facility] Emergency Department [address], [place], [place] [address] [phone]  . [facility] Emergency Department [address], [place], [place] [address] [phone]  . [facility] Emergency Department [address], [place], [place] [address] [phone]  NOTE: If you entered your credit card information for this eVisit, you will not be charged. You may see a "hold" on your card for the $35 but that hold will drop off and you will not have a charge processed.   Your e-visit answers were reviewed by a board certified advanced clinical practitioner to complete your personal care plan.  Thank you for using e-Visits.  

## 2018-07-31 NOTE — ED Provider Notes (Signed)
[facility] EMERGENCY DEPARTMENT Provider Note   CSN: [id number] Arrival date & time: 07/31/18  2241    History   Chief Complaint No chief complaint on file. Fever, cough  HPI Bailey Carney is a 28 y.o. female.     Patient presents to the emergency department with a chief complaint of fever, cough, sore throat, and body aches that started on Tuesday of this week.  She reports a T-max of 100.1 degrees.  She reports suspected known exposure to patient with COVID-19.  She states that she persists in having a cough, which is dry and nonproductive and nonbloody.  She reports feeling SOB with exertion. She reports having used an inhaler at home, and also reports that she has asthma.  She denies any other associated symptoms.  The history is provided by the patient. No language interpreter was used.    Past Medical History:  Diagnosis Date  . Asthma    does not use inhaler often  . Chlamydia   . Cholecystitis   . Genital herpes   . Hypertension    on meds    Patient Active Problem List   Diagnosis Date Noted  . Obesity in pregnancy 04/03/2018  . Acute cholecystitis 11/25/2017  . History of ELISA positive for HSV 10/11/2017  . Chronic hypertension during pregnancy, antepartum 09/06/2017  . Supervision of high risk pregnancy, antepartum 09/06/2017  . Asthma   . Hypertension     Past Surgical History:  Procedure Laterality Date  . NO PAST SURGERIES       OB History    Gravida  1   Para  1   Term  1   Preterm  0   AB  0   Living  1     SAB  0   TAB  0   Ectopic  0   Multiple  0   Live Births  1            Home Medications    Prior to Admission medications   Medication Sig Start Date End Date Taking? Authorizing Provider  albuterol (PROVENTIL HFA;VENTOLIN HFA) 108 (90 Base) MCG/ACT inhaler Inhale 1-2 puffs into the lungs every 6 (six) hours as needed for wheezing or shortness of breath. Patient not taking: Reported on  05/07/2018 11/14/17   Currie Paris, NP  amLODipine (NORVASC) 10 MG tablet Take 1 tablet (10 mg total) by mouth daily. Patient not taking: Reported on 07/16/2018 05/05/18 07/04/18  Donette Larry, CNM  ibuprofen (ADVIL,MOTRIN) 800 MG tablet Take 1 tablet (800 mg total) by mouth every 8 (eight) hours as needed for moderate pain or cramping. 07/16/18   Little, Ambrose Finland, MD  senna-docusate (SENOKOT-S) 8.6-50 MG tablet Take 2 tablets by mouth at bedtime as needed for mild constipation. Patient not taking: Reported on 05/07/2018 04/20/18   Tamera Stands, DO  valACYclovir (VALTREX) 500 MG tablet Take 1 tablet (500 mg total) by mouth 2 (two) times daily. Patient not taking: Reported on 05/07/2018 03/20/18   Allie Bossier, MD    Family History Family History  Problem Relation Age of Onset  . Hypertension Mother   . Breast cancer Maternal Grandmother   . Hypertension Maternal Grandmother   . Cancer Maternal Grandmother   . Breast cancer Paternal Grandmother   . Cancer Paternal Grandmother   . Cancer Maternal Grandfather     Social History Social History   Tobacco Use  . Smoking status: Never Smoker  . Smokeless  tobacco: Never Used  Substance Use Topics  . Alcohol use: Yes    Comment: socially  . Drug use: No     Allergies   Amoxicillin   Review of Systems Review of Systems  All other systems reviewed and are negative.    Physical Exam Updated Vital Signs BP (!) 155/90 (BP Location: Right Arm)   Pulse 85   Temp 97.7 F (36.5 C) (Oral)   Resp (!) 189   LMP 07/31/2018   SpO2 98%   Physical Exam Vitals signs and nursing note reviewed.  Constitutional:      Appearance: She is well-developed.     Comments: Well-appearing, NAD  HENT:     Head: Normocephalic and atraumatic.     Mouth/Throat:     Comments: Mildly erythematous oropharynx Eyes:     Conjunctiva/sclera: Conjunctivae normal.     Pupils: Pupils are equal, round, and reactive to light.  Neck:      Musculoskeletal: Normal range of motion and neck supple.  Cardiovascular:     Rate and Rhythm: Normal rate and regular rhythm.     Heart sounds: No murmur. No friction rub. No gallop.   Pulmonary:     Effort: Pulmonary effort is normal. No respiratory distress.     Breath sounds: Normal breath sounds. No wheezing or rales.     Comments: CTAB Chest:     Chest wall: No tenderness.  Abdominal:     General: Bowel sounds are normal. There is no distension.     Palpations: Abdomen is soft. There is no mass.     Tenderness: There is no abdominal tenderness. There is no guarding or rebound.  Musculoskeletal: Normal range of motion.        General: No tenderness.  Skin:    General: Skin is warm and dry.  Neurological:     Mental Status: She is alert and oriented to person, place, and time.  Psychiatric:        Behavior: Behavior normal.        Thought Content: Thought content normal.        Judgment: Judgment normal.      ED Treatments / Results  Labs (all labs ordered are listed, but only abnormal results are displayed) Labs Reviewed - No data to display  EKG None  Radiology No results found.  Procedures Procedures (including critical care time)  Medications Ordered in ED Medications - No data to display   Initial Impression / Assessment and Plan / ED Course  I have reviewed the triage vital signs and the nursing notes.  Pertinent labs & imaging results that were available during my care of the patient were reviewed by me and considered in my medical decision making (see chart for details).        Pt CXR negative for acute infiltrate. Patients symptoms are consistent with URI, likely viral etiology. Discussed that antibiotics are not indicated for viral infections. Pt will be discharged with symptomatic treatment.  Verbalizes understanding and is agreeable with plan. Pt is hemodynamically stable & in NAD prior to dc.   Bailey Carney was evaluated in Emergency  Department on 08/01/2018 for the symptoms described in the history of present illness. She was evaluated in the context of the global COVID-19 pandemic, which necessitated consideration that the patient might be at risk for infection with the SARS-CoV-2 virus that causes COVID-19. Institutional protocols and algorithms that pertain to the evaluation of patients at risk for COVID-19 are in a  state of rapid change based on information released by regulatory bodies including the CDC and federal and state organizations. These policies and algorithms were followed during the patient's care in the ED.   Final Clinical Impressions(s) / ED Diagnoses   Final diagnoses:  Viral illness    ED Discharge Orders    None       Roxy HorsemanBrowning, Juel Ripley, PA-C 08/01/18 0113    Glynn Octaveancour, Stephen, MD 08/01/18 [phone]

## 2018-07-31 NOTE — Telephone Encounter (Signed)
Pt calling with complaints of fever, shortness of breath with activity, dry cough, diarrhea, sore throat, runny nose and body feeling sore. Pt states that her symptoms started on Tuesday. Pt states she was in direct contact with someone who was helping her of Monday and Tuesday who was rumored to be sent home last week for COVID-19. Pt states that the person was also was the significant other of boss and they live together. Pt states she has been around her boss everyday. Pt has a history of asthma and HTN and also has a 353 month old baby. Pt advised that she needed to self isolate at home for at least 7 days since symptoms started and >72 hours after fever resolution in addition to improvement of respiratory symptoms.Pt does not have a PCP at this time but does have MyChart. Pt advised to do an evisit via MyChart and to seek treatment in the ED with worsening shortness of breath. Pt verbalized understanding.  Reason for Disposition . MILD difficulty breathing (e.g., minimal/no SOB at rest, SOB with walking, pulse <100)  Answer Assessment - Initial Assessment Questions 1. CLOSE CONTACT: "Who is the person with the confirmed or suspected COVID-19 infection that you were exposed to?"     *No Answer* 2. PLACE of CONTACT: "Where were you when you were exposed to COVID-19?" (e.g., home, school, medical waiting room; which city?)     *No Answer* 3. TYPE of CONTACT: "How much contact was there?" (e.g., sitting next to, live in same house, work in same office, same building)     At work 4. DURATION of CONTACT: "How long were you in contact with the COVID-19 patient?" (e.g., a few seconds, passed by person, a few minutes, live with the patient)     No contact 5. DATE of CONTACT: "When did you have contact with a COVID-19 patient?" (e.g., how many days ago)     n/a 6. TRAVEL: "Have you traveled out of the country recently?" If so, "When and where?"     * Also ask about out-of-state travel, since the CDC has  identified some high risk cities for community spread in the [place].     * Note: Travel becomes less relevant if there is widespread community transmission where the patient lives.    No 7. COMMUNITY SPREAD: "Are there lots of cases or COVID-19 (community spread) where you live?" (See public health department website, if unsure)   * MAJOR community spread: high number of cases; numbers of cases are increasing; many people hospitalized.   * MINOR community spread: low number of cases; not increasing; few or no people hospitalized     yes 8. SYMPTOMS: "Do you have any symptoms?" (e.g., fever, cough, breathing difficulty)     Shortness of breath that gets worse with activity and sometimes it feels like chest is heavy with lying there 9. PREGNANCY OR POSTPARTUM: "Is there any chance you are pregnant?" "When was your last menstrual period?" "Did you deliver in the last 2 weeks?"     No, I don't believe so, First menstrual cycle now since having her son 5710. HIGH RISK: "Do you have any heart or lung problems? Do you have a weak immune system?" (e.g., CHF, COPD, asthma, HIV positive, chemotherapy, renal failure, diabetes mellitus, sickle cell anemia)       HTN, asthma  Answer Assessment - Initial Assessment Questions 1. COVID-19 DIAGNOSIS: "Who made your Coronavirus (COVID-19) diagnosis?" "Was it confirmed by a positive lab test?" If not diagnosed  by a HCP, ask "Are there lots of cases (community spread) where you live?" (See public health department website, if unsure)   * MAJOR community spread: high number of cases; numbers of cases are increasing; many people hospitalized.   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 Pt states that that coworkers significant other is her boss and they live together 2. ONSET: "When did the COVID-19 symptoms  start?"      Tuesday of this week 3. WORST SYMPTOM: "What is your worst symptom?" (e.g., cough, fever, shortness of breath, muscle aches)     Shortness of breath 4. COUGH: "How bad is the cough?"       Pt states that cough is dry 5. FEVER: "Do you have a fever?" If so, ask: "What is your temperature, how was it measured, and when did it start?"     Pt reports that she did have a fever but it has not been higher than 100.1, temperature taken this morning was 98.7 6. RESPIRATORY STATUS: "Describe your breathing?" (e.g., shortness of breath, wheezing, unable to speak)      Shortness of breath with activity 7. HIGH RISK DISEASE: "Do you have any chronic medical problems?" (e.g., asthma, heart or lung disease, weak immune system, etc.)    HTN, asthma 8. OTHER SYMPTOMS: "Do you have any other symptoms?"  (e.g., runny nose, headache, sore throat, loss of smell)      Pt states she started out having a sore throat and runny nose which continue along with diarrhea and states that her body feels sore  Protocols used: CORONAVIRUS (COVID-19) DIAGNOSED OR SUSPECTED-A-AH, CORONAVIRUS (COVID-19) EXPOSURE-A-AH

## 2018-07-31 NOTE — ED Triage Notes (Signed)
Pt reports cough, fever of 100.1 Tuesday that broke Wednesday, chills, body aches, sore throat, dry cough, runny nose, SOB with exertion.

## 2018-08-01 NOTE — Discharge Instructions (Addendum)
Person Under Monitoring Name: Bailey Carney  Location: [address] [place] [address]   Infection Prevention Recommendations for Individuals Confirmed to have, or Being Evaluated for, 2019 Novel Coronavirus (COVID-19) Infection Who Receive Care at Home  Individuals who are confirmed to have, or are being evaluated for, COVID-19 should follow the prevention steps below until a healthcare provider or local or state health department says they can return to normal activities.  Stay home except to get medical care You should restrict activities outside your home, except for getting medical care. Do not go to work, school, or public areas, and do not use public transportation or taxis.  Call ahead before visiting your doctor Before your medical appointment, call the healthcare provider and tell them that you have, or are being evaluated for, COVID-19 infection. This will help the healthcare providers office take steps to keep other people from getting infected. Ask your healthcare provider to call the local or state health department.  Monitor your symptoms Seek prompt medical attention if your illness is worsening (e.g., difficulty breathing). Before going to your medical appointment, call the healthcare provider and tell them that you have, or are being evaluated for, COVID-19 infection. Ask your healthcare provider to call the local or state health department.  Wear a facemask You should wear a facemask that covers your nose and mouth when you are in the same room with other people and when you visit a healthcare provider. People who live with or visit you should also wear a facemask while they are in the same room with you.  Separate yourself from other people in your home As much as possible, you should stay in a different room from other people in your home. Also, you should use a separate bathroom, if available.  Avoid sharing household items You should not  share dishes, drinking glasses, cups, eating utensils, towels, bedding, or other items with other people in your home. After using these items, you should wash them thoroughly with soap and water.  Cover your coughs and sneezes Cover your mouth and nose with a tissue when you cough or sneeze, or you can cough or sneeze into your sleeve. Throw used tissues in a lined trash can, and immediately wash your hands with soap and water for at least 20 seconds or use an alcohol-based hand rub.  Wash your [employer] your hands often and thoroughly with soap and water for at least 20 seconds. You can use an alcohol-based hand sanitizer if soap and water are not available and if your hands are not visibly dirty. Avoid touching your eyes, nose, and mouth with unwashed hands.   Prevention Steps for Caregivers and Household Members of Individuals Confirmed to have, or Being Evaluated for, COVID-19 Infection Being Cared for in the Home  If you live with, or provide care at home for, a person confirmed to have, or being evaluated for, COVID-19 infection please follow these guidelines to prevent infection:  Follow healthcare providers instructions Make sure that you understand and can help the patient follow any healthcare provider instructions for all care.  Provide for the patients basic needs You should help the patient with basic needs in the home and provide support for getting groceries, prescriptions, and other personal needs.  Monitor the patients symptoms If they are getting sicker, call his or her medical provider and tell them that the patient has, or is being evaluated for, COVID-19 infection. This will help the healthcare  providers office take steps to keep other people from getting infected. Ask the healthcare provider to call the local or state health department.  Limit the number of people who have contact with the patient If possible, have only one caregiver for the  patient. Other household members should stay in another home or place of residence. If this is not possible, they should stay in another room, or be separated from the patient as much as possible. Use a separate bathroom, if available. Restrict visitors who do not have an essential need to be in the home.  Keep older adults, very young children, and other sick people away from the patient Keep older adults, very young children, and those who have compromised immune systems or chronic health conditions away from the patient. This includes people with chronic heart, lung, or kidney conditions, diabetes, and cancer.  Ensure good ventilation Make sure that shared spaces in the home have good air flow, such as from an air conditioner or an opened window, weather permitting.  Wash your hands often Wash your hands often and thoroughly with soap and water for at least 20 seconds. You can use an alcohol based hand sanitizer if soap and water are not available and if your hands are not visibly dirty. Avoid touching your eyes, nose, and mouth with unwashed hands. Use disposable paper towels to dry your hands. If not available, use dedicated cloth towels and replace them when they become wet.  Wear a facemask and gloves Wear a disposable facemask at all times in the room and gloves when you touch or have contact with the patients blood, body fluids, and/or secretions or excretions, such as sweat, saliva, sputum, nasal mucus, vomit, urine, or feces.  Ensure the mask fits over your nose and mouth tightly, and do not touch it during use. Throw out disposable facemasks and gloves after using them. Do not reuse. Wash your hands immediately after removing your facemask and gloves. If your personal clothing becomes contaminated, carefully remove clothing and launder. Wash your hands after handling contaminated clothing. Place all used disposable facemasks, gloves, and other waste in a lined container before  disposing them with other household waste. Remove gloves and wash your hands immediately after handling these items.  Do not share dishes, glasses, or other household items with the patient Avoid sharing household items. You should not share dishes, drinking glasses, cups, eating utensils, towels, bedding, or other items with a patient who is confirmed to have, or being evaluated for, COVID-19 infection. After the person uses these items, you should wash them thoroughly with soap and water.  Wash laundry thoroughly Immediately remove and wash clothes or bedding that have blood, body fluids, and/or secretions or excretions, such as sweat, saliva, sputum, nasal mucus, vomit, urine, or feces, on them. Wear gloves when handling laundry from the patient. Read and follow directions on labels of laundry or clothing items and detergent. In general, wash and dry with the warmest temperatures recommended on the label.  Clean all areas the individual has used often Clean all touchable surfaces, such as counters, tabletops, doorknobs, bathroom fixtures, toilets, phones, keyboards, tablets, and bedside tables, every day. Also, clean any surfaces that may have blood, body fluids, and/or secretions or excretions on them. Wear gloves when cleaning surfaces the patient has come in contact with. Use a diluted bleach solution (e.g., dilute bleach with 1 part bleach and 10 parts water) or a household disinfectant with a label that says EPA-registered for coronaviruses. To make  a bleach solution at home, add 1 tablespoon of bleach to 1 quart (4 cups) of water. For a larger supply, add  cup of bleach to 1 gallon (16 cups) of water. Read labels of cleaning products and follow recommendations provided on product labels. Labels contain instructions for safe and effective use of the cleaning product including precautions you should take when applying the product, such as wearing gloves or eye protection and making sure you  have good ventilation during use of the product. Remove gloves and wash hands immediately after cleaning.  Monitor yourself for signs and symptoms of illness Caregivers and household members are considered close contacts, should monitor their health, and will be asked to limit movement outside of the home to the extent possible. Follow the monitoring steps for close contacts listed on the symptom monitoring form.   ? If you have additional questions, contact your local health department or call the epidemiologist on call at [phone] (available 24/7). ? This guidance is subject to change. For the most up-to-date guidance from [facility], please refer to their website: [email or web address]

## 2018-08-02 NOTE — ED Notes (Signed)
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  Onset symptoms 07-29-18, fever broke 07-30-18.  Per patient, was told to stay out of work 7 days.  Discussed with Rolan Bucco, PA, work note authorized.

## 2018-10-23 ENCOUNTER — Emergency Department (HOSPITAL_COMMUNITY)
Admission: EM | Admit: 2018-10-23 | Discharge: 2018-10-23 | Disposition: A | Discharge status: Home / Self Care | Payer: Medicaid Other | Attending: Emergency Medicine | Admitting: Emergency Medicine

## 2018-10-23 ENCOUNTER — Other Ambulatory Visit: Payer: Self-pay

## 2018-10-23 DIAGNOSIS — G44209 Tension-type headache, unspecified, not intractable: Secondary | ICD-10-CM | POA: Diagnosis not present

## 2018-10-23 DIAGNOSIS — I10 Essential (primary) hypertension: Secondary | ICD-10-CM | POA: Insufficient documentation

## 2018-10-23 DIAGNOSIS — Y9389 Activity, other specified: Secondary | ICD-10-CM | POA: Diagnosis not present

## 2018-10-23 DIAGNOSIS — S0990XA Unspecified injury of head, initial encounter: Secondary | ICD-10-CM | POA: Diagnosis present

## 2018-10-23 DIAGNOSIS — Y999 Unspecified external cause status: Secondary | ICD-10-CM | POA: Insufficient documentation

## 2018-10-23 DIAGNOSIS — Z79899 Other long term (current) drug therapy: Secondary | ICD-10-CM | POA: Diagnosis not present

## 2018-10-23 DIAGNOSIS — Y92481 Parking lot as the place of occurrence of the external cause: Secondary | ICD-10-CM | POA: Diagnosis not present

## 2018-10-23 DIAGNOSIS — S161XXA Strain of muscle, fascia and tendon at neck level, initial encounter: Secondary | ICD-10-CM | POA: Insufficient documentation

## 2018-10-23 DIAGNOSIS — J45909 Unspecified asthma, uncomplicated: Secondary | ICD-10-CM | POA: Insufficient documentation

## 2018-10-23 DIAGNOSIS — T148XXA Other injury of unspecified body region, initial encounter: Secondary | ICD-10-CM

## 2018-10-23 MED ORDER — LIDOCAINE 5 % EX PTCH
1.0000 | MEDICATED_PATCH | CUTANEOUS | Status: DC
  Administered 2018-10-23: 1 via TRANSDERMAL
  Filled 2018-10-23: qty 1

## 2018-10-23 MED ORDER — NAPROXEN 250 MG PO TABS
500.0000 mg | ORAL_TABLET | Freq: Once | ORAL | Status: AC
  Administered 2018-10-23: 500 mg via ORAL
  Filled 2018-10-23: qty 2

## 2018-10-23 MED ORDER — TRAMADOL HCL 50 MG PO TABS: 50.0000 mg | ORAL_TABLET | Freq: Three times a day (TID) | ORAL | 0 refills | Status: DC | PRN

## 2018-10-23 MED ORDER — METHOCARBAMOL 500 MG PO TABS: 500.0000 mg | ORAL_TABLET | Freq: Two times a day (BID) | ORAL | 0 refills | Status: DC | PRN

## 2018-10-23 MED ORDER — IBUPROFEN 800 MG PO TABS: 800.0000 mg | ORAL_TABLET | Freq: Three times a day (TID) | ORAL | 0 refills | Status: DC | PRN

## 2018-10-23 NOTE — Discharge Instructions (Signed)
You had a normal neurologic exam while in the emergency department.  This is reassuring.  It is possible that you may have a mild concussion.  A concussion is a diagnosis that is made clinically and does not show any abnormal results on imaging such as a CT scan or MRI.    A concussion may cause a persistent headache over the next few days. This can be brought on or worsened by loud sounds or bright lights. Try to avoid excessive use of cell phones, television, video games as this may worsening headaches.  Avoid strenuous activity and heavy lifting over the next few days.  If you develop severe worsening of your headache, vision changes or loss, uncontrolled vomiting, numbness or tingling to one side of your body, difficulty walking or lifting your arms or legs, return promptly to the emergency department for repeat evaluation.  For neck and back pain, alternate ice and heat to areas of injury 3-4 times per day to limit inflammation and spasm.  Avoid strenuous activity and heavy lifting.  We recommend consistent use of ibuprofen in addition to Robaxin for muscle spasms.  You have been prescribed tramadol to take as needed for severe pain.  Do not drive or drink alcohol after taking this medication as it may make you drowsy and impair your judgment.  We recommend follow-up with a primary care doctor to ensure resolution of symptoms.
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
## 2018-10-23 NOTE — ED Notes (Signed)
Patient verbalized understanding of dc instructions, vss, ambulatory with nad.   
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
## 2018-10-27 ENCOUNTER — Encounter (HOSPITAL_COMMUNITY): Payer: Self-pay | Admitting: *Deleted

## 2018-10-27 ENCOUNTER — Emergency Department (HOSPITAL_COMMUNITY)
Admission: EM | Admit: 2018-10-27 | Discharge: 2018-10-27 | Disposition: A | Discharge status: Home / Self Care | Payer: Medicaid Other | Attending: Emergency Medicine | Admitting: Emergency Medicine

## 2018-10-27 ENCOUNTER — Other Ambulatory Visit: Payer: Self-pay

## 2018-10-27 DIAGNOSIS — I10 Essential (primary) hypertension: Secondary | ICD-10-CM | POA: Insufficient documentation

## 2018-10-27 DIAGNOSIS — S060X1D Concussion with loss of consciousness of 30 minutes or less, subsequent encounter: Secondary | ICD-10-CM | POA: Insufficient documentation

## 2018-10-27 DIAGNOSIS — M25519 Pain in unspecified shoulder: Secondary | ICD-10-CM | POA: Diagnosis not present

## 2018-10-27 DIAGNOSIS — H538 Other visual disturbances: Secondary | ICD-10-CM | POA: Diagnosis not present

## 2018-10-27 DIAGNOSIS — R42 Dizziness and giddiness: Secondary | ICD-10-CM | POA: Diagnosis not present

## 2018-10-27 DIAGNOSIS — M542 Cervicalgia: Secondary | ICD-10-CM | POA: Insufficient documentation

## 2018-10-27 DIAGNOSIS — S0990XD Unspecified injury of head, subsequent encounter: Secondary | ICD-10-CM | POA: Diagnosis present

## 2018-10-27 DIAGNOSIS — M549 Dorsalgia, unspecified: Secondary | ICD-10-CM | POA: Insufficient documentation

## 2018-10-27 DIAGNOSIS — J45909 Unspecified asthma, uncomplicated: Secondary | ICD-10-CM | POA: Diagnosis not present
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
## 2018-12-10 ENCOUNTER — Other Ambulatory Visit: Payer: Self-pay

## 2018-12-10 ENCOUNTER — Emergency Department (HOSPITAL_COMMUNITY)
Admission: EM | Admit: 2018-12-10 | Discharge: 2018-12-11 | Discharge status: Against Medical Advice | Payer: Medicaid Other | Attending: Emergency Medicine | Admitting: Emergency Medicine

## 2018-12-10 ENCOUNTER — Telehealth: Payer: Self-pay | Admitting: Advanced Practice Midwife

## 2018-12-10 ENCOUNTER — Encounter (HOSPITAL_COMMUNITY): Payer: Self-pay

## 2018-12-10 DIAGNOSIS — M545 Low back pain: Secondary | ICD-10-CM | POA: Insufficient documentation

## 2018-12-10 DIAGNOSIS — Z5321 Procedure and treatment not carried out due to patient leaving prior to being seen by health care provider: Secondary | ICD-10-CM | POA: Diagnosis not present

## 2018-12-10 LAB — CBC WITH DIFFERENTIAL/PLATELET
Abs Immature Granulocytes: 0.01 10*3/uL (ref 0.00–0.07)
Basophils Absolute: 0 10*3/uL (ref 0.0–0.1)
Basophils Relative: 1 %
Eosinophils Absolute: 0 10*3/uL (ref 0.0–0.5)
Eosinophils Relative: 1 %
HCT: 36.9 % (ref 36.0–46.0)
Hemoglobin: 11.6 g/dL — ABNORMAL LOW (ref 12.0–15.0)
Immature Granulocytes: 0 %
Lymphocytes Relative: 39 %
Lymphs Abs: 3.3 10*3/uL (ref 0.7–4.0)
MCH: 26.7 pg (ref 26.0–34.0)
MCHC: 31.4 g/dL (ref 30.0–36.0)
MCV: 85 fL (ref 80.0–100.0)
Monocytes Absolute: 0.6 10*3/uL (ref 0.1–1.0)
Monocytes Relative: 7 %
Neutro Abs: 4.5 10*3/uL (ref 1.7–7.7)
Neutrophils Relative %: 52 %
Platelets: 188 10*3/uL (ref 150–400)
RBC: 4.34 MIL/uL (ref 3.87–5.11)
RDW: 14.6 % (ref 11.5–15.5)
WBC: 8.4 10*3/uL (ref 4.0–10.5)
nRBC: 0 % (ref 0.0–0.2)

## 2018-12-10 LAB — URINALYSIS, ROUTINE W REFLEX MICROSCOPIC
Bacteria, UA: NONE SEEN
Bilirubin Urine: NEGATIVE
Glucose, UA: NEGATIVE mg/dL
Hgb urine dipstick: NEGATIVE
Ketones, ur: NEGATIVE mg/dL
Leukocytes,Ua: NEGATIVE
Nitrite: NEGATIVE
Protein, ur: 30 mg/dL — AB
Specific Gravity, Urine: 1.026 (ref 1.005–1.030)
pH: 7 (ref 5.0–8.0)

## 2018-12-10 LAB — I-STAT BETA HCG BLOOD, ED (MC, WL, AP ONLY): I-stat hCG, quantitative: <5 m[IU]/mL (ref <5)

## 2018-12-10 MED ORDER — SODIUM CHLORIDE 0.9% FLUSH: 3.0000 mL | Freq: Once | INTRAVENOUS | Status: DC

## 2018-12-10 NOTE — Telephone Encounter (Signed)
Received a call this morning from Ms. Hanisch requesting birth control. Informed her our appointments are out till the end of September, early October. Norman Herrlich is not available, so I asked if she was willing to see another provider. She stated she would just seek out another provider.

## 2018-12-10 NOTE — ED Triage Notes (Signed)
Pt arrives POV for eval of lower back and R sided flank/abd pain. Pt reports onset of R flank pain x 1 year intermittently, worse recently and worse lower back pain x 4 days.

## 2018-12-11 LAB — HEPATIC FUNCTION PANEL
ALT: 17 U/L (ref 0–44)
AST: 17 U/L (ref 15–41)
Albumin: 3.6 g/dL (ref 3.5–5.0)
Alkaline Phosphatase: 47 U/L (ref 38–126)
Bilirubin, Direct: <0.1 mg/dL (ref 0.0–0.2)
Total Bilirubin: 0.5 mg/dL (ref 0.3–1.2)
Total Protein: 6.4 g/dL — ABNORMAL LOW (ref 6.5–8.1)

## 2018-12-11 LAB — BASIC METABOLIC PANEL
Anion gap: 7 (ref 5–15)
BUN: 16 mg/dL (ref 6–20)
CO2: 27 mmol/L (ref 22–32)
Calcium: 8.9 mg/dL (ref 8.9–10.3)
Chloride: 105 mmol/L (ref 98–111)
Creatinine, Ser: 1.43 mg/dL — ABNORMAL HIGH (ref 0.44–1.00)
GFR calc Af Amer: 58 mL/min — ABNORMAL LOW (ref >60)
GFR calc non Af Amer: 50 mL/min — ABNORMAL LOW (ref >60)
Glucose, Bld: 97 mg/dL (ref 70–99)
Potassium: 4 mmol/L (ref 3.5–5.1)
Sodium: 139 mmol/L (ref 135–145)

## 2018-12-11 LAB — LIPASE, BLOOD: Lipase: 26 U/L (ref 11–51)

## 2018-12-12 ENCOUNTER — Telehealth (HOSPITAL_COMMUNITY): Payer: Self-pay

## 2018-12-31 IMAGING — US US MFM FETAL BPP W/O NON-STRESS
1 series · 12 of 17 positions shown · non-contrast
Comparison: none

[Series 1: us mfm fetal bpp w/o non-stress · 17 acquisitions, 12 frames shown]
[im 1/17]
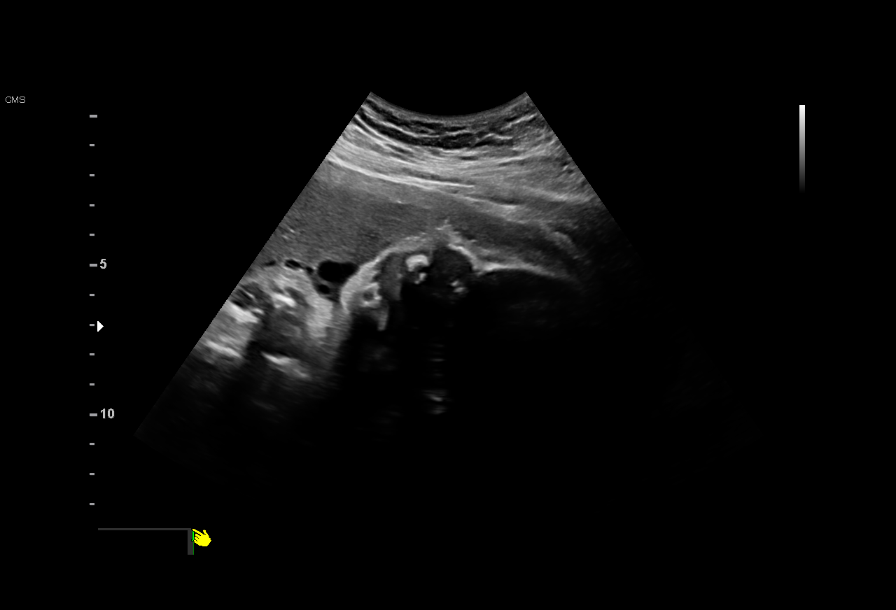
[im 3/17]
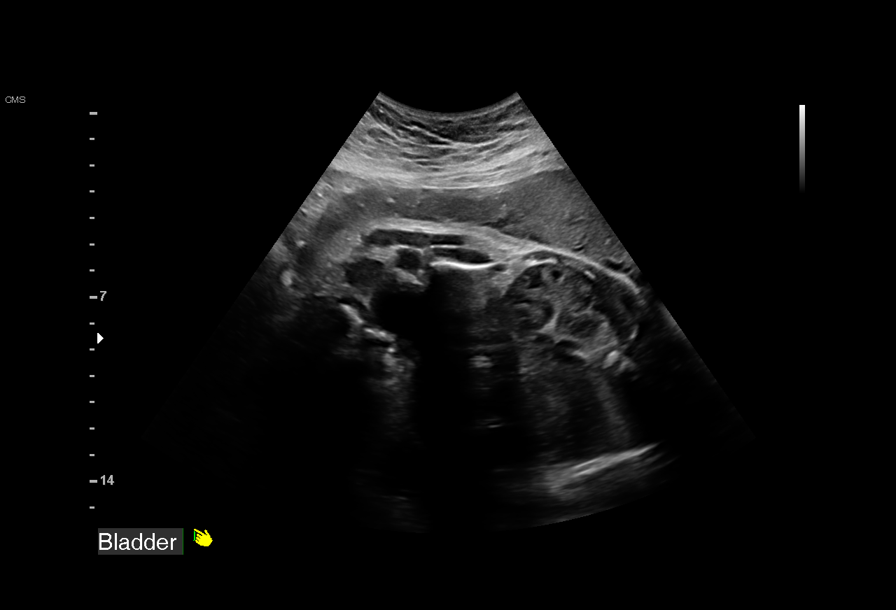
[im 4/17]
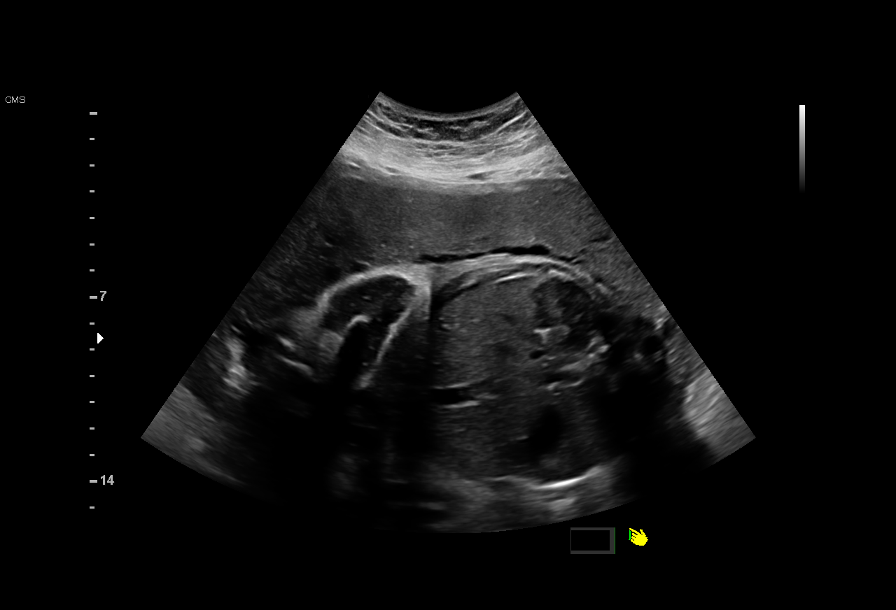
[im 5/17]
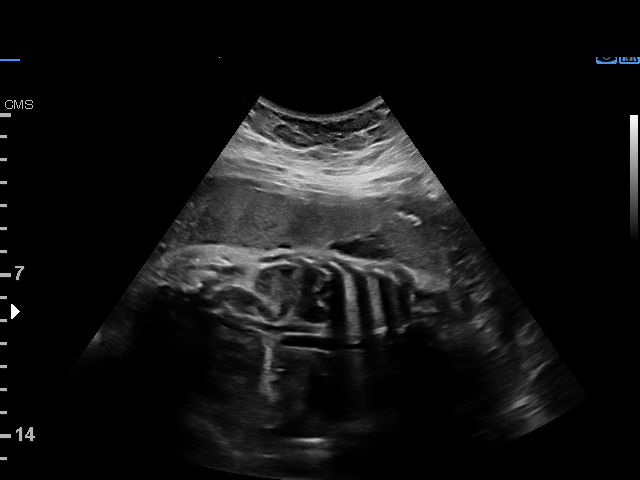
[im 7/17]
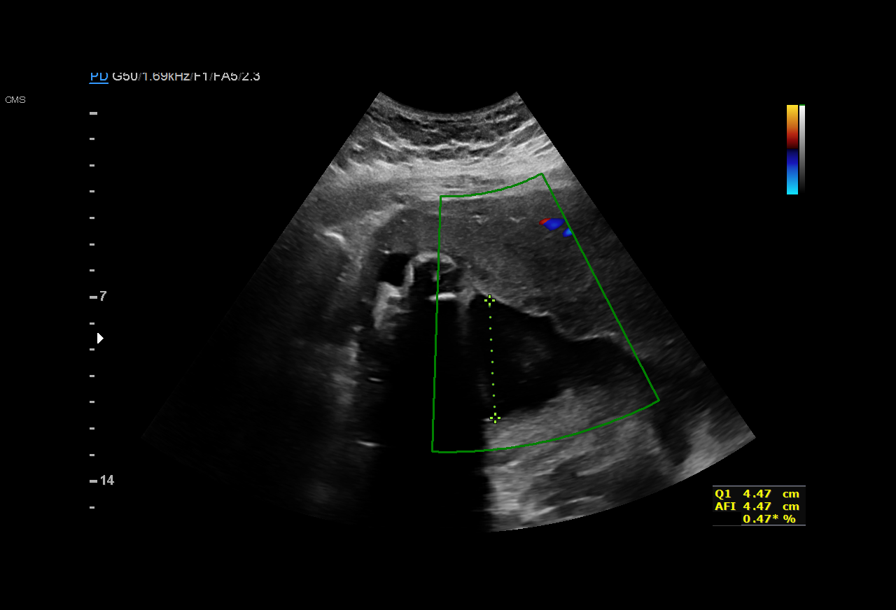
[im 8/17]
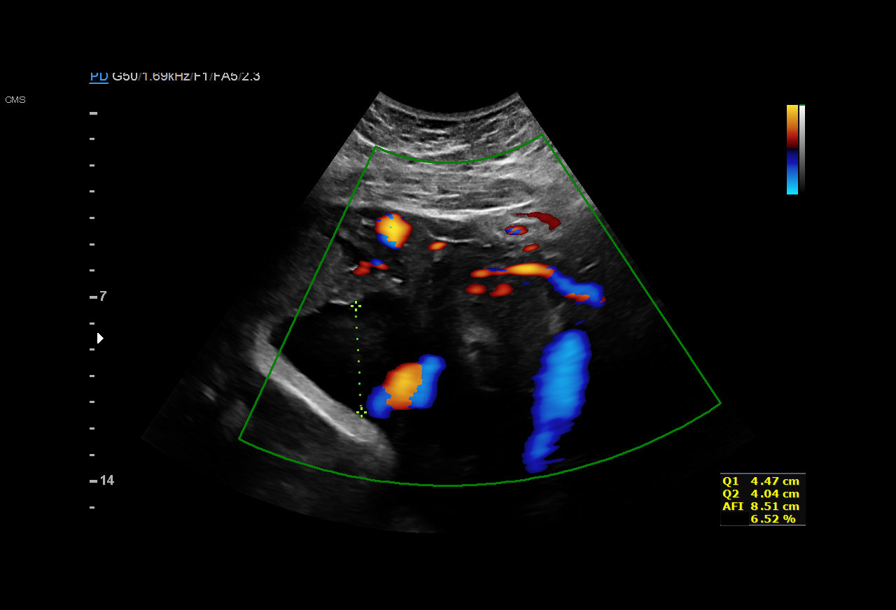
[im 10/17]
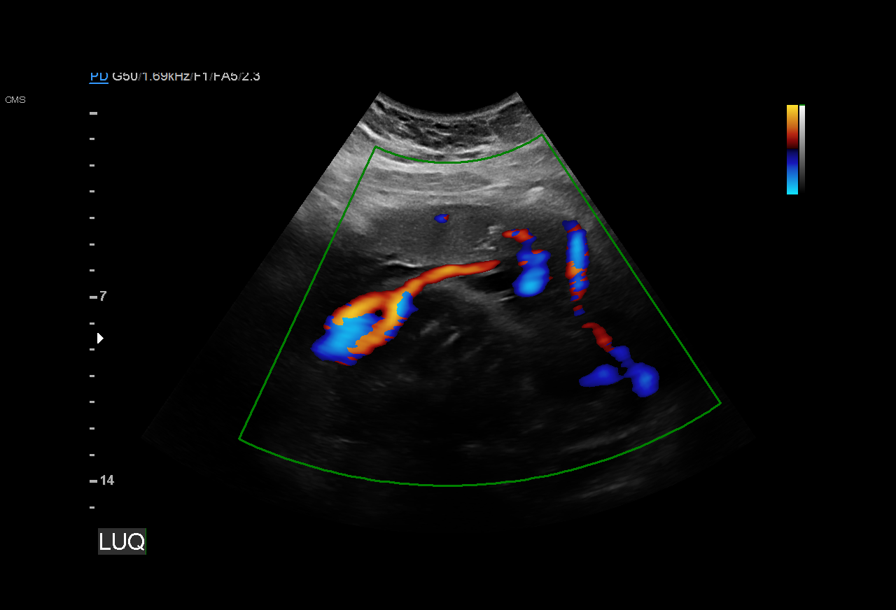
[im 11/17]
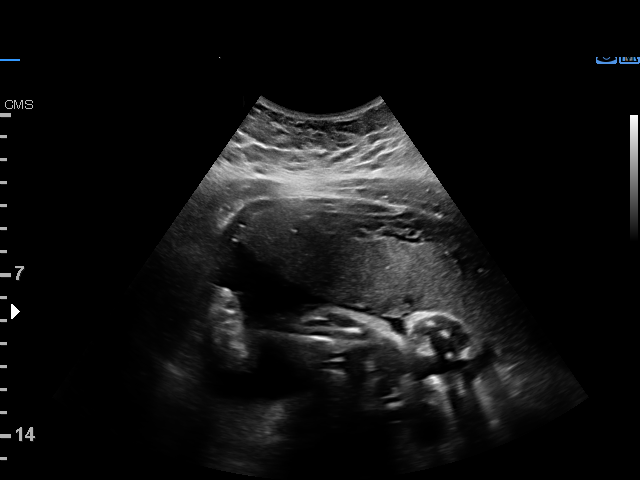
[im 13/17]
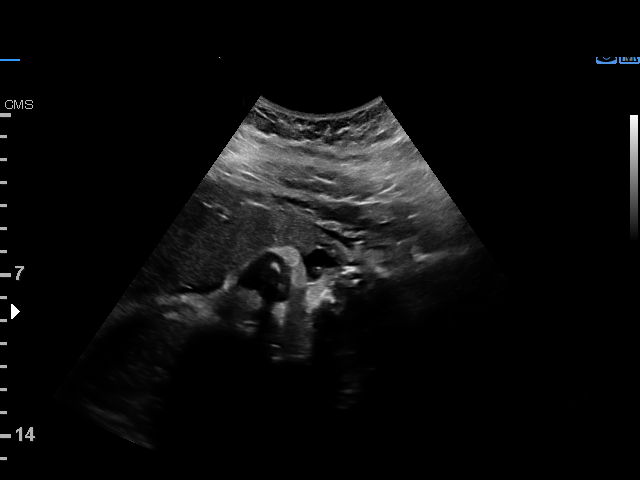
[im 14/17]
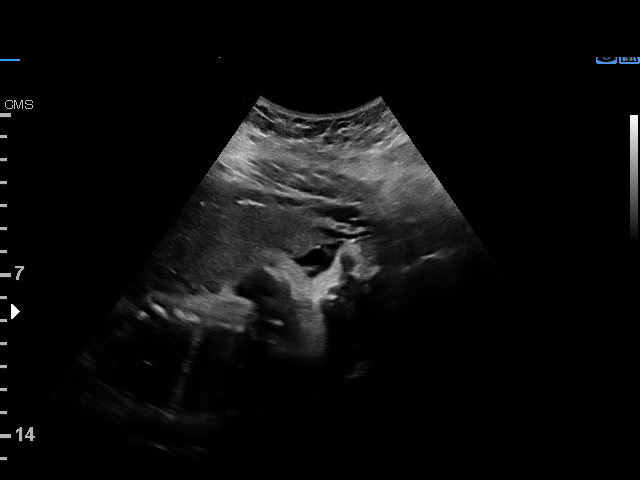
[im 15/17]
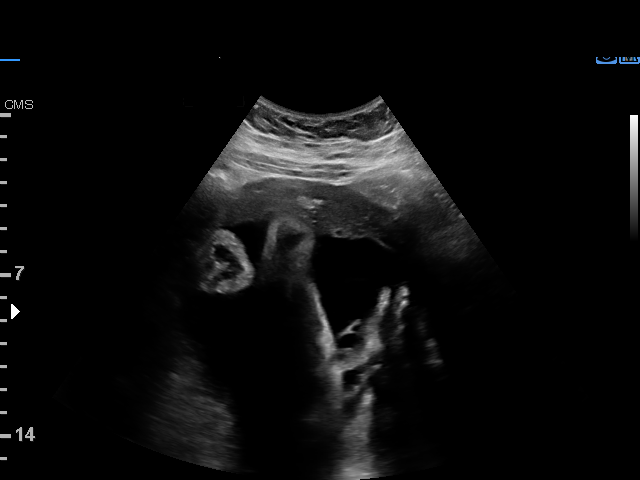
[im 17/17]
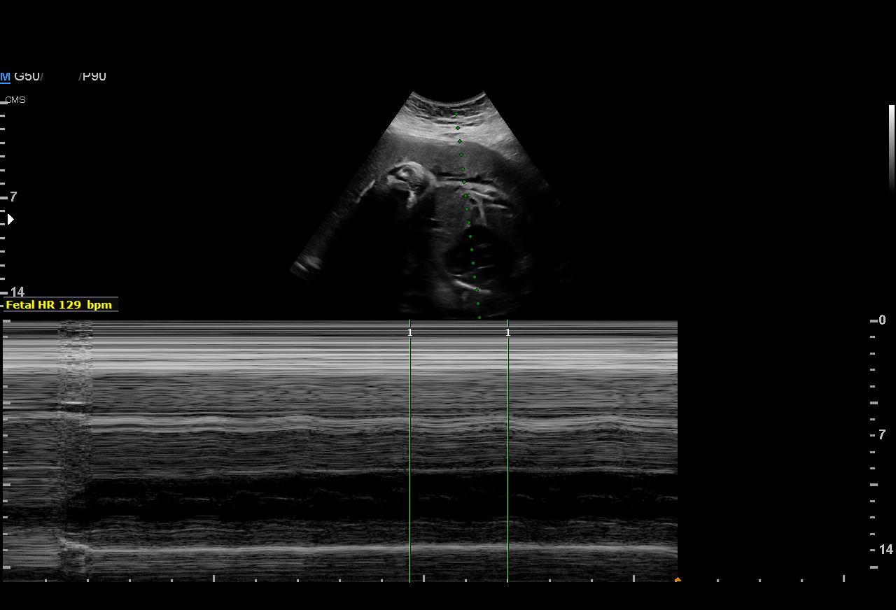

[12 of 17 positions shown; findings below may reference images not displayed]

----------------------------------------------------------------------

 ----------------------------------------------------------------------
Indications

  34 weeks gestation of pregnancy
  Hypertension - Chronic/Pre-existing
  (labetalol 200mg)
  Obesity complicating pregnancy, third
  trimester
  Asthma (albuterol)                             HLL.XL j39.454
 ----------------------------------------------------------------------
Vital Signs

                                                Height:        5'7"
Fetal Evaluation

 Num Of Fetuses:          1
 Fetal Heart Rate(bpm):   129
 Cardiac Activity:        Observed
 Presentation:            Cephalic
 Placenta:                Anterior
 P. Cord Insertion:       Visualized

 Amniotic Fluid
 AFI FV:      Within normal limits

 AFI Sum(cm)     %Tile       Largest Pocket(cm)
 10              20

 RUQ(cm)       RLQ(cm)       LUQ(cm)

Biophysical Evaluation
 Amniotic F.V:   Within normal limits       F. Tone:         Observed
 F. Movement:    Observed                   Score:           [DATE]
 F. Breathing:   Observed
OB History

 Gravidity:    1
Gestational Age

 LMP:           36w 1d        Date:  07/14/17                 EDD:   04/20/18
 Best:          34w 3d     Det. By:  Early Ultrasound         EDD:   05/02/18
                                     (09/05/17)
Impression

 Biophysical profile [DATE]
Recommendations

 Follow up growth and BPP scheduld on 03/27/2018

## 2019-01-26 ENCOUNTER — Encounter (HOSPITAL_COMMUNITY): Payer: Self-pay | Admitting: Emergency Medicine

## 2019-01-26 ENCOUNTER — Emergency Department (HOSPITAL_COMMUNITY)
Admission: EM | Admit: 2019-01-26 | Discharge: 2019-01-26 | Disposition: A | Discharge status: Home / Self Care | Payer: Medicaid Other | Attending: Emergency Medicine | Admitting: Emergency Medicine

## 2019-01-26 ENCOUNTER — Other Ambulatory Visit: Payer: Self-pay

## 2019-01-26 DIAGNOSIS — J45909 Unspecified asthma, uncomplicated: Secondary | ICD-10-CM | POA: Diagnosis not present

## 2019-01-26 DIAGNOSIS — M545 Low back pain, unspecified: Secondary | ICD-10-CM

## 2019-01-26 DIAGNOSIS — N3000 Acute cystitis without hematuria: Secondary | ICD-10-CM | POA: Insufficient documentation

## 2019-01-26 DIAGNOSIS — N898 Other specified noninflammatory disorders of vagina: Secondary | ICD-10-CM | POA: Diagnosis not present

## 2019-01-26 DIAGNOSIS — Z113 Encounter for screening for infections with a predominantly sexual mode of transmission: Secondary | ICD-10-CM | POA: Diagnosis not present

## 2019-01-26 DIAGNOSIS — I10 Essential (primary) hypertension: Secondary | ICD-10-CM | POA: Insufficient documentation

## 2019-01-26 LAB — URINALYSIS, ROUTINE W REFLEX MICROSCOPIC
Bilirubin Urine: NEGATIVE
Glucose, UA: NEGATIVE mg/dL
Hgb urine dipstick: NEGATIVE
Ketones, ur: NEGATIVE mg/dL
Nitrite: NEGATIVE
Protein, ur: NEGATIVE mg/dL
Specific Gravity, Urine: 1.025 (ref 1.005–1.030)
pH: 8 (ref 5.0–8.0)

## 2019-01-26 LAB — PREGNANCY, URINE: Preg Test, Ur: NEGATIVE

## 2019-01-26 LAB — WET PREP, GENITAL
Clue Cells Wet Prep HPF POC: NONE SEEN
Sperm: NONE SEEN
Trich, Wet Prep: NONE SEEN

## 2019-01-26 MED ORDER — NITROFURANTOIN MONOHYD MACRO 100 MG PO CAPS: 100.0000 mg | ORAL_CAPSULE | Freq: Two times a day (BID) | ORAL | 0 refills | Status: DC

## 2019-01-26 NOTE — Discharge Instructions (Addendum)
Today we discussed that the medical recommendation is for a pelvic examination to evaluate for additional conditions.  We discussed that these include, but are not limited to, pelvic inflammatory disease, tubo-ovarian abscess, which can result in loss of future fertility, and can lead to severe possibly life-threatening infections if not appropriately treated resulting in multiple complications.  We also discussed that this limits my ability to evaluate you for tenderness in your adnexa (the area where your ovaries are) and to evaluate you for other possibly serious conditions.  We also discussed that by leaving before getting the results of your wet prep you may have an abnormal wet prep, that may show evidence of sexually transmitted infections, and you will not be notified if this is abnormal.  If your symptoms worsen, or you have additional concerns please seek additional medical care and evaluation.  Please schedule a follow-up appointment with your gynecologist.  If you do not have an OB/GYN I have given you the information for the [facility] outpatient clinic.  Today you have been given medications for a urinary tract infection.  You may have diarrhea from the antibiotics.  It is very important that you continue to take the antibiotics even if you get diarrhea unless a medical professional tells you that you may stop taking them.  If you stop too early the bacteria you are being treated for will become stronger and you may need different, more powerful antibiotics that have more side effects and worsening diarrhea.  Please stay well hydrated and consider probiotics as they may decrease the severity of your diarrhea.  Please be aware that if you take any hormonal contraception (birth control pills, nexplanon, the ring, etc) that your birth control will not work while you are taking antibiotics and you need to use back up protection as directed on the birth control medication information insert.     While in the ED your blood pressure was high.  Please follow up with your primary care doctor or the wellness clinic for repeat evaluation as you may need medication.  High blood pressure can cause long term, potentially serious, damage if left untreated.

## 2019-01-26 NOTE — ED Provider Notes (Signed)
[facility] EMERGENCY DEPARTMENT Provider Note   CSN: [id number] Arrival date & time: 01/26/19  1129     History   Chief Complaint No chief complaint on file.   HPI  Bailey Carney is a 28 y.o. female with a past medical history of hypertension who presents today for evaluation of back pain worsening over the past 4 days.  She reports that she has intermittently had back pain since the birth of her son 9 months ago.  She reports that her pain is a 5 out of 10.  Does not radiate or move.  She denies any saddle anesthesias, fevers.  She does report dysuria, frequency, urgency and suprapubic pain.  She also reports a white vaginal discharge.  She denies any IV drug use or personal history of cancer.    She denies any injury.  [facility].  She thinks she was diagnosed with BV at that time, however is unsure.  She does report that she has concerns about sexually transmitted infections as her partner has previously been unfaithful.  She also reports that she is setting up an appointment with her primary care doctor to start on hypertension medications again.     HPI  Past Medical History:  Diagnosis Date  . Asthma    does not use inhaler often  . Chlamydia   . Cholecystitis   . Genital herpes   . Hypertension    on meds    Patient Active Problem List   Diagnosis Date Noted  . Obesity in pregnancy 04/03/2018  . Acute cholecystitis 11/25/2017  . History of ELISA positive for HSV 10/11/2017  . Chronic hypertension during pregnancy, antepartum 09/06/2017  . Supervision of high risk pregnancy, antepartum 09/06/2017  . Asthma   . Hypertension     Past Surgical History:  Procedure Laterality Date  . NO PAST SURGERIES       OB History    Gravida  1   Para  1   Term  1   Preterm  0   AB  0   Living  1     SAB  0   TAB  0   Ectopic  0   Multiple  0   Live Births  1            Home  Medications    Prior to Admission medications   Medication Sig Start Date End Date Taking? Authorizing Provider  albuterol (PROVENTIL HFA;VENTOLIN HFA) 108 (90 Base) MCG/ACT inhaler Inhale 1-2 puffs into the lungs every 6 (six) hours as needed for wheezing or shortness of breath. Patient not taking: Reported on 05/07/2018 11/14/17   Virginia Rochester, NP  amLODipine (NORVASC) 10 MG tablet Take 1 tablet (10 mg total) by mouth daily. Patient not taking: Reported on 08/01/2018 05/05/18 08/01/27  Julianne Handler, CNM  ibuprofen (ADVIL) 800 MG tablet Take 1 tablet (800 mg total) by mouth every 8 (eight) hours as needed for headache, mild pain or moderate pain. 10/23/18   Antonietta Breach, PA-C  methocarbamol (ROBAXIN) 500 MG tablet Take 1 tablet (500 mg total) by mouth every 12 (twelve) hours as needed for muscle spasms. 10/23/18   Antonietta Breach, PA-C  nitrofurantoin, macrocrystal-monohydrate, (MACROBID) 100 MG capsule Take 1 capsule (100 mg total) by mouth 2 (two) times daily. 01/26/19   Lorin Glass, PA-C  senna-docusate (SENOKOT-S) 8.6-50 MG tablet Take 2 tablets by mouth at bedtime as  needed for mild constipation. Patient not taking: Reported on 05/07/2018 04/20/18   Tamera Stands, DO  traMADol (ULTRAM) 50 MG tablet Take 1 tablet (50 mg total) by mouth every 8 (eight) hours as needed for severe pain. 10/23/18   Antony Madura, PA-C  valACYclovir (VALTREX) 500 MG tablet Take 1 tablet (500 mg total) by mouth 2 (two) times daily. Patient not taking: Reported on 05/07/2018 03/20/18   Allie Bossier, MD    Family History Family History  Problem Relation Age of Onset  . Hypertension Mother   . Breast cancer Maternal Grandmother   . Hypertension Maternal Grandmother   . Cancer Maternal Grandmother   . Breast cancer Paternal Grandmother   . Cancer Paternal Grandmother   . Cancer Maternal Grandfather     Social History Social History   Tobacco Use  . Smoking status: Never Smoker  . Smokeless tobacco:  Never Used  Substance Use Topics  . Alcohol use: Yes    Comment: socially  . Drug use: No     Allergies   Amoxicillin   Review of Systems Review of Systems  Constitutional: Negative for chills and fever.  Gastrointestinal: Negative for abdominal pain, diarrhea, nausea and vomiting.  Genitourinary: Positive for dysuria, frequency, urgency and vaginal discharge. Negative for flank pain, hematuria, pelvic pain, vaginal bleeding and vaginal pain.  Musculoskeletal: Positive for back pain.  All other systems reviewed and are negative.    Physical Exam Updated Vital Signs BP (!) 152/107 (BP Location: Right Arm)   Pulse 77   Temp 98.2 F (36.8 C) (Oral)   Resp 16   Ht 5\' 7"  (1.702 m)   Wt 99.8 kg   SpO2 100%   BMI 34.46 kg/m   Physical Exam Vitals signs and nursing note reviewed.  Constitutional:      General: She is not in acute distress.    Appearance: She is well-developed. She is not diaphoretic.  HENT:     Head: Normocephalic and atraumatic.  Eyes:     General: No scleral icterus.       Right eye: No discharge.        Left eye: No discharge.     Conjunctiva/sclera: Conjunctivae normal.  Neck:     Musculoskeletal: Normal range of motion.  Cardiovascular:     Rate and Rhythm: Normal rate and regular rhythm.  Pulmonary:     Effort: Pulmonary effort is normal. No respiratory distress.     Breath sounds: No stridor.  Abdominal:     General: There is no distension.     Tenderness: There is no abdominal tenderness. There is no right CVA tenderness, left CVA tenderness or guarding.  Musculoskeletal:        General: No deformity.     Comments: L-spine palpated without midline tenderness palpation, step-offs, or deformities.  There is diffuse right-sided paraspinal muscle tenderness to palpation.  Palpation here both re-creates and exacerbates her reported back pain.    Skin:    General: Skin is warm and dry.  Neurological:     General: No focal deficit present.      Mental Status: She is alert.     Cranial Nerves: No cranial nerve deficit.     Motor: No abnormal muscle tone.  Psychiatric:        Mood and Affect: Mood normal.        Behavior: Behavior normal.      ED Treatments / Results  Labs (all labs ordered are listed, but  only abnormal results are displayed) Labs Reviewed  URINALYSIS, ROUTINE W REFLEX MICROSCOPIC - Abnormal; Notable for the following components:      Result Value   Leukocytes,Ua SMALL (*)    Bacteria, UA RARE (*)    All other components within normal limits  WET PREP, GENITAL  PREGNANCY, URINE  GC/CHLAMYDIA PROBE AMP ([place]) NOT AT [facility]    EKG None  Radiology No results found.  Procedures Procedures (including critical care time)  Medications Ordered in ED Medications - No data to display   Initial Impression / Assessment and Plan / ED Course  I have reviewed the triage vital signs and the nursing notes.  Pertinent labs & imaging results that were available during my care of the patient were reviewed by me and considered in my medical decision making (see chart for details).       Patient presents today for evaluation of multiple complaints. She reports dysuria, increased frequency and urgency over the past 4 days.  UA obtained with microscopy showing rare bacteria, 21-50 white blood cells, 0-5 red cells, rare bacteria with 0-5 squamous epithelial cells.  This with the combination of urinary symptoms is indicative of urinary tract infection.  While she does have back pain she does not have true CVA tenderness to percussion.  Her back pain has been intermittent since the birth of her son 9 months ago and she says feels consistent with that pain.  She is generally well-appearing, not tachycardic or tachypneic and afebrile.  Not suspect pyelonephritis. As she is allergic to amoxicillin we will treat with Macrobid. She also reported white vaginal discharge.  We discussed that a pelvic exam is medically  indicated and that by not having a pelvic exam there is a high possibility to delay or not treat conditions that may be serious and resulted in possibly life-threatening infection or loss of fertility including, but not limited to, TOA, PID and ovarian mass.  She refused pelvic exam stating that she needed to leave to go pick up her child.  She was given the option to ask questions and after which made the informed decision to refuse.  She wished to self swab for GC and wet prep.  She is aware that, with the flow of the emergency room, if her wet prep is abnormal she will not be notified and need to follow that on her own.  She states her understanding of this.  She was offered empiric treatment for gonorrhea and chlamydia which she declined.  Her pregnancy test is negative. While in the emergency room she is noted to be hypertensive at 152/107.  She states that she has a primary care doctor and is setting up an appointment to get this addressed.  She is given information on [employer] eating plan.   Return precautions were discussed with patient who states their understanding.  At the time of discharge patient denied any unaddressed complaints or concerns.  Patient is agreeable for discharge home.   Final Clinical Impressions(s) / ED Diagnoses   Final diagnoses:  Acute midline low back pain without sciatica  Vaginal discharge  Hypertension, unspecified type  Acute cystitis without hematuria    ED Discharge Orders         Ordered    nitrofurantoin, macrocrystal-monohydrate, (MACROBID) 100 MG capsule  2 times daily     01/26/19 1414           Cristina GongHammond, Nickalus Thornsberry W, [place] JerseyPA-C 01/26/19 2302    Arby BarrettePfeiffer, Marcy, MD 02/04/19 [phone]

## 2019-01-26 NOTE — ED Triage Notes (Signed)
Pt here with complaints of back pain and burning while urinating. Pt recently treated for BV.

## 2019-01-27 LAB — GC/CHLAMYDIA PROBE AMP (~~LOC~~) NOT AT ARMC
Chlamydia: NEGATIVE
Neisseria Gonorrhea: NEGATIVE

## 2019-02-06 ENCOUNTER — Encounter (HOSPITAL_COMMUNITY): Payer: Self-pay | Admitting: Emergency Medicine

## 2019-02-06 ENCOUNTER — Other Ambulatory Visit: Payer: Self-pay

## 2019-02-06 ENCOUNTER — Emergency Department (HOSPITAL_COMMUNITY)
Admission: EM | Admit: 2019-02-06 | Discharge: 2019-02-07 | Disposition: A | Discharge status: Home / Self Care | Payer: Medicaid Other | Attending: Emergency Medicine | Admitting: Emergency Medicine

## 2019-02-06 DIAGNOSIS — Z5321 Procedure and treatment not carried out due to patient leaving prior to being seen by health care provider: Secondary | ICD-10-CM | POA: Insufficient documentation

## 2019-02-06 DIAGNOSIS — R109 Unspecified abdominal pain: Secondary | ICD-10-CM | POA: Insufficient documentation

## 2019-02-06 LAB — COMPREHENSIVE METABOLIC PANEL
ALT: 15 U/L (ref 0–44)
AST: 17 U/L (ref 15–41)
Albumin: 3.8 g/dL (ref 3.5–5.0)
Alkaline Phosphatase: 50 U/L (ref 38–126)
Anion gap: 10 (ref 5–15)
BUN: 12 mg/dL (ref 6–20)
CO2: 24 mmol/L (ref 22–32)
Calcium: 9.3 mg/dL (ref 8.9–10.3)
Chloride: 106 mmol/L (ref 98–111)
Creatinine, Ser: 0.95 mg/dL (ref 0.44–1.00)
GFR calc Af Amer: >60 mL/min (ref >60)
GFR calc non Af Amer: >60 mL/min (ref >60)
Glucose, Bld: 94 mg/dL (ref 70–99)
Potassium: 3.7 mmol/L (ref 3.5–5.1)
Sodium: 140 mmol/L (ref 135–145)
Total Bilirubin: 0.7 mg/dL (ref 0.3–1.2)
Total Protein: 7 g/dL (ref 6.5–8.1)

## 2019-02-06 LAB — CBC
HCT: 38.2 % (ref 36.0–46.0)
Hemoglobin: 12.1 g/dL (ref 12.0–15.0)
MCH: 27.3 pg (ref 26.0–34.0)
MCHC: 31.7 g/dL (ref 30.0–36.0)
MCV: 86.2 fL (ref 80.0–100.0)
Platelets: 196 10*3/uL (ref 150–400)
RBC: 4.43 MIL/uL (ref 3.87–5.11)
RDW: 13.5 % (ref 11.5–15.5)
WBC: 9.9 10*3/uL (ref 4.0–10.5)
nRBC: 0 % (ref 0.0–0.2)

## 2019-02-06 LAB — URINALYSIS, ROUTINE W REFLEX MICROSCOPIC
Bacteria, UA: NONE SEEN
Bilirubin Urine: NEGATIVE
Glucose, UA: NEGATIVE mg/dL
Hgb urine dipstick: NEGATIVE
Ketones, ur: NEGATIVE mg/dL
Nitrite: NEGATIVE
Protein, ur: NEGATIVE mg/dL
Specific Gravity, Urine: 1.025 (ref 1.005–1.030)
pH: 5 (ref 5.0–8.0)

## 2019-02-06 LAB — I-STAT BETA HCG BLOOD, ED (MC, WL, AP ONLY): I-stat hCG, quantitative: <5 m[IU]/mL (ref <5)

## 2019-02-06 LAB — LIPASE, BLOOD: Lipase: 27 U/L (ref 11–51)

## 2019-02-06 MED ORDER — SODIUM CHLORIDE 0.9% FLUSH: 3.0000 mL | Freq: Once | INTRAVENOUS | Status: DC

## 2019-02-06 NOTE — ED Triage Notes (Signed)
Pt reports 10/10 left sided abd pain/flank pain that started 2 days ago and has gotten worse. Pt reports some dysuria. Pt denies N/V/D. Hx of htn.

## 2019-02-06 NOTE — ED Notes (Signed)
Patient observed by registration leaving. 

## 2019-02-17 ENCOUNTER — Ambulatory Visit (INDEPENDENT_AMBULATORY_CARE_PROVIDER_SITE_OTHER): Payer: Medicaid Other | Admitting: Clinical

## 2019-02-17 ENCOUNTER — Other Ambulatory Visit: Payer: Self-pay | Admitting: Advanced Practice Midwife

## 2019-02-17 ENCOUNTER — Other Ambulatory Visit: Payer: Self-pay

## 2019-02-17 DIAGNOSIS — F39 Unspecified mood [affective] disorder: Secondary | ICD-10-CM

## 2019-02-17 MED ORDER — SERTRALINE HCL 50 MG PO TABS: 50.0000 mg | ORAL_TABLET | Freq: Every day | ORAL | 1 refills | Status: DC

## 2019-02-17 NOTE — BH Specialist Note (Signed)
Integrated Behavioral Health via Telemedicine Video Visit  02/17/2019 [name] [id number]  Number of [place] visits: 1 Session Start time: 1:22  Session End time: 2:15 Total time: 53 minutes  Referring Provider: Marcille Buffy, CNM for postpartum depression Type of Visit: Video Patient/Family location: Home [facility] Provider location: [facility] All persons participating in visit: Patient Bailey Carney and [place]  Confirmed patient's address: Yes  Confirmed patient's phone number: Yes  Any changes to demographics: No   Confirmed patient's insurance: Yes  Any changes to patient's insurance: No   Discussed confidentiality: Yes   I connected with [name]  by a video enabled telemedicine application and verified that I am speaking with the correct person using two identifiers.     I discussed the limitations of evaluation and management by telemedicine and the availability of in person appointments.  I discussed that the purpose of this visit is to provide behavioral health care while limiting exposure to the novel coronavirus.   Discussed there is a possibility of technology failure and discussed alternative modes of communication if that failure occurs.  I discussed that engaging in this video visit, they consent to the provision of behavioral healthcare and the services will be billed under their insurance.  Patient and/or legal guardian expressed understanding and consented to video visit: Yes   PRESENTING CONCERNS: Patient and/or family reports the following symptoms/concerns: Pt states she began feeling "on edge" with anxiety in pregnancy, but thought she could manage on her own, and is now realizing she may need more help coping with her emotions at 10 months postpartum; pt open to both medication and self coping strategies today.  Duration of problem: Over one year; Severity of problem: moderately severe  STRENGTHS (Protective  Factors/Coping Skills): Open to treatment  GOALS ADDRESSED: Patient will: 1.  Reduce symptoms of: anxiety, depression and stress  2.  Increase knowledge and/or ability of: healthy habits and self-management skills  3.  Demonstrate ability to: Increase healthy adjustment to current life circumstances and Increase motivation to adhere to plan of care  INTERVENTIONS: Interventions utilized:  Mindfulness or Psychologist, educational and Psychoeducation and/or Health Education Standardized Assessments completed: GAD-7 and PHQ 9  ASSESSMENT: Patient currently experiencing Mood disorder, unspecified.   Patient may benefit from psychoeducation and brief therapeutic interventions regarding coping with symptoms of anxiety and depression .  PLAN: 1. Follow up with behavioral health clinician on : One week 2. Behavioral recommendations:  -Begin taking Zoloft as prescribed -CALM relaxation breathing exercise twice daily (morning; at bedtime) 3. Referral(s): [place] (In Clinic)  I discussed the assessment and treatment plan with the patient and/or parent/guardian. They were provided an opportunity to ask questions and all were answered. They agreed with the plan and demonstrated an understanding of the instructions.   They were advised to call back or seek an in-person evaluation if the symptoms worsen or if the condition fails to improve as anticipated.  Caroleen Hamman [facility]  Depression screen [facility] 2/9 02/17/2019 04/10/2018 02/14/2018 01/29/2018 01/01/2018  Decreased Interest 3 1 1 1 1   Down, Depressed, Hopeless 3 0 0 1 1  PHQ - 2 Score 6 1 1 2 2   Altered sleeping 3 0 3 3 2   Tired, decreased energy 3 2 3 2 2   Change in appetite 3 2 2 3 3   Feeling bad or failure about yourself  1 0 0 1 0  Trouble concentrating 1 0 0 1 1  Moving slowly  or fidgety/restless 3 2 2 2  0  Suicidal thoughts 0 0 0 0 0  PHQ-9 Score 20 7 11 14 10    GAD 7 : Generalized Anxiety Score 02/17/2019  04/10/2018 02/14/2018 01/29/2018  Nervous, Anxious, on Edge 3 1 2 2   Control/stop worrying 3 1 2 1   Worry too much - different things 3 2 2 2   Trouble relaxing 2 2 1 2   Restless 2 1 2 1   Easily annoyed or irritable 3 3 3 3   Afraid - awful might happen 3 1 1 2   Total GAD 7 Score 19 11 13  13

## 2019-02-17 NOTE — Progress Notes (Signed)
Patient had virtual visit with Roselyn Reef today. Requesting medication for depression and anxiety. Will start Zoloft. Patient will follow with Roselyn Reef until she can establish with PCP.   Marcille Buffy DNP, CNM  02/17/19  4:16 PM

## 2019-02-23 ENCOUNTER — Telehealth: Payer: Self-pay | Admitting: Clinical

## 2019-02-23 NOTE — Telephone Encounter (Signed)
Integrated Behavioral Health Medication Management Phone Note  MRN: [id number] NAME: Bailey Carney  Time Call Initiated: 3:46 Time Call Completed: 3:50 Total Call Time: 4 minutes  Current Medications:  Outpatient Medications Prior to Visit  Medication Sig Dispense Refill  . albuterol (PROVENTIL HFA;VENTOLIN HFA) 108 (90 Base) MCG/ACT inhaler Inhale 1-2 puffs into the lungs every 6 (six) hours as needed for wheezing or shortness of breath. (Patient not taking: Reported on 05/07/2018) 1 Inhaler 0  . amLODipine (NORVASC) 10 MG tablet Take 1 tablet (10 mg total) by mouth daily. (Patient not taking: Reported on 08/01/2018) 30 tablet 1  . ibuprofen (ADVIL) 800 MG tablet Take 1 tablet (800 mg total) by mouth every 8 (eight) hours as needed for headache, mild pain or moderate pain. 12 tablet 0  . methocarbamol (ROBAXIN) 500 MG tablet Take 1 tablet (500 mg total) by mouth every 12 (twelve) hours as needed for muscle spasms. 20 tablet 0  . nitrofurantoin, macrocrystal-monohydrate, (MACROBID) 100 MG capsule Take 1 capsule (100 mg total) by mouth 2 (two) times daily. 10 capsule 0  . senna-docusate (SENOKOT-S) 8.6-50 MG tablet Take 2 tablets by mouth at bedtime as needed for mild constipation. (Patient not taking: Reported on 05/07/2018) 10 tablet 0  . sertraline (ZOLOFT) 50 MG tablet Take 1 tablet (50 mg total) by mouth daily. Start with 1/2 tablet daily for one week, and then 1 tablet daily 30 tablet 1  . traMADol (ULTRAM) 50 MG tablet Take 1 tablet (50 mg total) by mouth every 8 (eight) hours as needed for severe pain. 12 tablet 0  . valACYclovir (VALTREX) 500 MG tablet Take 1 tablet (500 mg total) by mouth 2 (two) times daily. (Patient not taking: Reported on 05/07/2018) 30 tablet 6   No facility-administered medications prior to visit.     Patient has been able to get all medications filled as prescribed: Yes  Patient is currently taking all medications as prescribed: Yes; decided to continue taking  1/2 pill this week, as she also had to take cough medicine, and will take full pill/day starting next week  Patient reports experiencing side effects: Yes- Felt "a little weird" after taking a full pill one day, and decided to continue at 1/2 pill/day until next week (see above)  Patient describes feeling this way on medications: See above  Additional patient concerns: none at this time  Patient advised to schedule appointment with provider for evaluation of medication side effects or additional concerns: No; pt already scheduled for Thursday, 02/26/19   Caroleen Hamman Elyssa Pendelton, LCSW

## 2019-02-25 NOTE — BH Specialist Note (Signed)
Integrated Behavioral Health via Telemedicine Video Visit  02/25/2019 [name] [id number]  Number of [place] visits: 2 (3 total) Session Start time: 10:20  Session End time: 10:37 Total time: 20  Referring Provider: Marcille Carney, CNM for postpartum depression Type of Visit: Video Patient/Family location: Home [facility] Provider location: [facility] All persons participating in visit: Patient Bailey Carney and Bailey Carney  Confirmed patient's address: Yes  Confirmed patient's phone number: Yes  Any changes to demographics: No   Confirmed patient's insurance: Yes  Any changes to patient's insurance: No   Discussed confidentiality: At previous visit  I connected with Bailey Carney by a video enabled telemedicine application and verified that I am speaking with the correct person using two identifiers.     I discussed the limitations of evaluation and management by telemedicine and the availability of in person appointments.  I discussed that the purpose of this visit is to provide behavioral health care while limiting exposure to the novel coronavirus.   Discussed there is a possibility of technology failure and discussed alternative modes of communication if that failure occurs.  I discussed that engaging in this video visit, they consent to the provision of behavioral healthcare and the services will be billed under their insurance.  Patient and/or legal guardian expressed understanding and consented to video visit: Yes   PRESENTING CONCERNS: Patient and/or family reports the following symptoms/concerns: Pt states she is feeling much better since her last visit, so that her family has noticed, and have encouraged her to continue taking medication and doing her breathing exercises; pt plans to begin taking the full 50mg  starting Monday, and is looking forward to working from home soon.  Duration of problem: Over one year; Severity of problem: moderately  severe at last visit  STRENGTHS (Protective Factors/Coping Skills): Open to treatment; supportive family  GOALS ADDRESSED: Patient will: 1.  Reduce symptoms of: anxiety, depression and stress  2.  Demonstrate ability to: Increase motivation to adhere to plan of care  INTERVENTIONS: Interventions utilized:  Medication Monitoring Standardized Assessments completed: Not Needed  ASSESSMENT: Patient currently experiencing Mood disorder, unspecified.   Patient may benefit from continued brief therapeutic interventions regarding coping with symptoms of anxiety and depression.  PLAN: 1. Follow up with behavioral health clinician on : One week (Will recheck symptoms after taking 50mg  Zoloft/day for one full week) 2. Behavioral recommendations:  -Continue taking BH medication as prescribed (one full pill/day) -Continue practicing relaxation breathing exercises daily 3. Referral(s): [place] (In Clinic)  I discussed the assessment and treatment plan with the patient and/or parent/guardian. They were provided an opportunity to ask questions and all were answered. They agreed with the plan and demonstrated an understanding of the instructions.   They were advised to call back or seek an in-person evaluation if the symptoms worsen or if the condition fails to improve as anticipated.  Bailey Carney [facility]  Depression screen [facility] 2/9 02/17/2019 04/10/2018 02/14/2018 01/29/2018 01/01/2018  Decreased Interest 3 1 1 1 1   Down, Depressed, Hopeless 3 0 0 1 1  PHQ - 2 Score 6 1 1 2 2   Altered sleeping 3 0 3 3 2   Tired, decreased energy 3 2 3 2 2   Change in appetite 3 2 2 3 3   Feeling bad or failure about yourself  1 0 0 1 0  Trouble concentrating 1 0 0 1 1  Moving slowly or fidgety/restless 3 2 2 2  0  Suicidal thoughts 0  0 0 0 0  PHQ-9 Score 20 7 11 14 10    GAD 7 : Generalized Anxiety Score 02/17/2019 04/10/2018 02/14/2018 01/29/2018  Nervous, Anxious, on Edge 3 1 2 2    Control/stop worrying 3 1 2 1   Worry too much - different things 3 2 2 2   Trouble relaxing 2 2 1 2   Restless 2 1 2 1   Easily annoyed or irritable 3 3 3 3   Afraid - awful might happen 3 1 1 2   Total GAD 7 Score 19 11 13  13

## 2019-02-26 ENCOUNTER — Other Ambulatory Visit: Payer: Self-pay

## 2019-02-26 ENCOUNTER — Ambulatory Visit (INDEPENDENT_AMBULATORY_CARE_PROVIDER_SITE_OTHER): Payer: Medicaid Other | Admitting: Clinical

## 2019-02-26 DIAGNOSIS — F39 Unspecified mood [affective] disorder: Secondary | ICD-10-CM | POA: Diagnosis not present

## 2019-03-02 ENCOUNTER — Other Ambulatory Visit (HOSPITAL_COMMUNITY)
Admission: RE | Admit: 2019-03-02 | Discharge: 2019-03-02 | Disposition: A | Discharge status: Home / Self Care | Payer: Medicaid Other | Source: Ambulatory Visit | Attending: Nurse Practitioner | Admitting: Nurse Practitioner

## 2019-03-02 ENCOUNTER — Other Ambulatory Visit: Payer: Self-pay

## 2019-03-02 ENCOUNTER — Ambulatory Visit (INDEPENDENT_AMBULATORY_CARE_PROVIDER_SITE_OTHER): Payer: Medicaid Other | Admitting: Nurse Practitioner

## 2019-03-02 ENCOUNTER — Encounter: Payer: Self-pay | Admitting: Nurse Practitioner

## 2019-03-02 VITALS — BP 142/101 | HR 82 | Wt 225.0 lb

## 2019-03-02 DIAGNOSIS — Z202 Contact with and (suspected) exposure to infections with a predominantly sexual mode of transmission: Secondary | ICD-10-CM | POA: Diagnosis not present

## 2019-03-02 DIAGNOSIS — I10 Essential (primary) hypertension: Secondary | ICD-10-CM

## 2019-03-02 MED ORDER — METRONIDAZOLE 500 MG PO TABS: 2000.0000 mg | ORAL_TABLET | Freq: Once | ORAL | 0 refills | Status: AC

## 2019-03-02 MED ORDER — AMLODIPINE BESYLATE 10 MG PO TABS: 10.0000 mg | ORAL_TABLET | Freq: Every day | ORAL | 1 refills | Status: DC

## 2019-03-02 NOTE — Progress Notes (Signed)
   GYNECOLOGY OFFICE VISIT NOTE   History:  28 y.o. G1P1001 here today for feeling of dryness vaginally.  Has been having intercourse without condoms.  Texted her partner and he tells her he was treated for trichomonas last week. She denies any abnormal bleeding, pelvic pain or other concerns. Does admit to some mild dysuria.  Past Medical History:  Diagnosis Date  . Asthma    does not use inhaler often  . Chlamydia   . Cholecystitis   . Genital herpes   . Hypertension    on meds    Past Surgical History:  Procedure Laterality Date  . NO PAST SURGERIES      The following portions of the patient's history were reviewed and updated as appropriate: allergies, current medications, past family history, past medical history, past social history, past surgical history and problem list.   Health Maintenance:  Normal pap on 09-2017.  Review of Systems:  Pertinent items noted in HPI and remainder of comprehensive ROS otherwise negative.  Objective:  Physical Exam BP (!) 142/101   Pulse 82   Wt 225 lb (102.1 kg)   LMP 02/08/2019 (Exact Date)   BMI 35.24 kg/m  CONSTITUTIONAL: Well-developed, well-nourished female in no acute distress.  HENT:  Normocephalic, atraumatic. External right and left ear normal.  SKIN: Skin is warm and dry. No rash noted. Not diaphoretic. No erythema. No pallor. NEUROLOGIC: Alert and oriented to person, place, and time. Normal muscle tone coordination. No cranial nerve deficit noted. PSYCHIATRIC: Normal mood and affect. Normal behavior. Normal judgment and thought content. ABDOMEN: Soft, no distention noted.   PELVIC: small amount of yellow discharge, small amount of contact bleeding, mild erythema noted MUSCULOSKELETAL: Normal range of motion. No edema noted.  Labs and Imaging No results found.  Assessment & Plan:  Contact to Trich, ntreated Metronidazole 2000mg  prescribed to her pharmacy to take as a single dose. Advised condoms with all intercourse.  Expect that mild dysuria will clear with treatment of trichomonas. Client declines blood work for HIV and RPR.   Hypertension Refilled her BP medication - is to have an appointment at [facility] later this week.  Is actively working on getting her appointment as she knows she needs it but is out of medication.  Routine preventative health maintenance measures emphasized. Please refer to After Visit Summary for other counseling recommendations.   Return for follow up this week with PCP.   Total face-to-face time with patient: 10 minutes.  Over 50% of encounter was spent on counseling and coordination of care.  Earlie Server, RN, MSN, NP-BC Nurse Practitioner, [facility] for [employer], [place] [facility] 03/02/2019 6:36 PM

## 2019-03-04 ENCOUNTER — Telehealth: Payer: Self-pay | Admitting: Clinical

## 2019-03-04 LAB — CERVICOVAGINAL ANCILLARY ONLY
Chlamydia: NEGATIVE
Comment: NEGATIVE
Comment: NEGATIVE
Comment: NORMAL
Neisseria Gonorrhea: NEGATIVE
Trichomonas: NEGATIVE

## 2019-03-04 NOTE — Telephone Encounter (Signed)
Spoke to patient about her virtual appointment w/ Roselyn Reef on 11/19 @ 10:45. Patient instructed that Roselyn Reef will be giving her a call around her appointment time w/ further instructions on how to do the visit virtually. Patient instructed that she does not have to come to the office for the appointment. Patient verbalized understanding.

## 2019-03-05 ENCOUNTER — Other Ambulatory Visit: Payer: Self-pay

## 2019-03-05 ENCOUNTER — Ambulatory Visit (INDEPENDENT_AMBULATORY_CARE_PROVIDER_SITE_OTHER): Payer: Medicaid Other | Admitting: Clinical

## 2019-03-05 DIAGNOSIS — F39 Unspecified mood [affective] disorder: Secondary | ICD-10-CM | POA: Diagnosis not present

## 2019-03-05 NOTE — BH Specialist Note (Signed)
Integrated Behavioral Health via Telemedicine Video Visit  03/05/2019 KAMILLAH DIDONATO [id number]  Number of [place] visits: 3 (4 total) Session Start time: 10:29  Session End time: 10:45 Total time: 16  Referring Provider: Marcille Buffy, CNM  Type of Visit: Video Patient/Family location: Home [facility] Provider location: [facility] All persons participating in visit: Patient Crystalina Stodghill and [place]    Confirmed patient's address: Yes  Confirmed patient's phone number: Yes  Any changes to demographics: No   Confirmed patient's insurance: Yes  Any changes to patient's insurance: No   Discussed confidentiality: At previous visit  I connected with [name]  by a video enabled telemedicine application and verified that I am speaking with the correct person using two identifiers.     I discussed the limitations of evaluation and management by telemedicine and the availability of in person appointments.  I discussed that the purpose of this visit is to provide behavioral health care while limiting exposure to the novel coronavirus.   Discussed there is a possibility of technology failure and discussed alternative modes of communication if that failure occurs.  I discussed that engaging in this video visit, they consent to the provision of behavioral healthcare and the services will be billed under their insurance.  Patient and/or legal guardian expressed understanding and consented to video visit: Yes   PRESENTING CONCERNS: Patient and/or family reports the following symptoms/concerns: Pt states she began taking Zoloft at 50mg  on Monday, is not feeling any negative side effects going from 25 to 50mg , can feel the depression is lifting, but still feeling anxious, partially attributed to starting a new job working from home.  Duration of problem: Over one year; Severity of problem: moderate  STRENGTHS (Protective Factors/Coping Skills): Open to treatment  and supportive family  GOALS ADDRESSED: Patient will: 1.  Reduce symptoms of: anxiety, depression and stress  2.  Demonstrate ability to: Increase motivation to adhere to plan of care  INTERVENTIONS: Interventions utilized:  Medication Monitoring Standardized Assessments completed: GAD-7 and PHQ 9  ASSESSMENT: Patient currently experiencing Mood disorer, unspecified .   Patient may benefit from continued brief therapeutic interventions regarding coping with symptoms of anxiety and depression.  PLAN: 1. Follow up with behavioral health clinician on : Two weeks 2. Behavioral recommendations:  -Continue taking Zoloft as prescribed -Continue using daily relaxation breathing exercises 3. Referral(s): [place] (In Clinic)  I discussed the assessment and treatment plan with the patient and/or parent/guardian. They were provided an opportunity to ask questions and all were answered. They agreed with the plan and demonstrated an understanding of the instructions.   They were advised to call back or seek an in-person evaluation if the symptoms worsen or if the condition fails to improve as anticipated.  Caroleen Hamman [facility]  Depression screen [facility] 2/9 03/05/2019 02/17/2019 04/10/2018 02/14/2018 01/29/2018  Decreased Interest 1 3 1 1 1   Down, Depressed, Hopeless 0 3 0 0 1  PHQ - 2 Score 1 6 1 1 2   Altered sleeping 1 3 0 3 3  Tired, decreased energy 3 3 2 3 2   Change in appetite 1 3 2 2 3   Feeling bad or failure about yourself  1 1 0 0 1  Trouble concentrating 2 1 0 0 1  Moving slowly or fidgety/restless 0 3 2 2 2   Suicidal thoughts 0 0 0 0 0  PHQ-9 Score 9 20 7 11 14    GAD 7 : Generalized Anxiety Score 03/05/2019  02/17/2019 04/10/2018 02/14/2018  Nervous, Anxious, on Edge 1 3 1 2   Control/stop worrying 2 3 1 2   Worry too much - different things 2 3 2 2   Trouble relaxing 2 2 2 1   Restless 1 2 1 2   Easily annoyed or irritable 1 3 3 3   Afraid - awful might  happen 1 3 1 1   Total GAD 7 Score 10 19 11  13

## 2019-03-19 ENCOUNTER — Ambulatory Visit: Payer: Medicaid Other | Admitting: Clinical

## 2019-03-19 ENCOUNTER — Other Ambulatory Visit: Payer: Self-pay

## 2019-03-19 DIAGNOSIS — F4323 Adjustment disorder with mixed anxiety and depressed mood: Secondary | ICD-10-CM

## 2019-03-19 NOTE — BH Specialist Note (Signed)
Integrated Behavioral Health via Telemedicine Video Visit  03/19/2019 GAYNELL EGGLETON [id number]  Number of Integrated Behavioral Health visits: 4 (5 total) Session Start time: 3: 52  Session End time: 4:04 Total time: 12   Referring Provider: Thressa Sheller, CNM Type of Visit: Video Patient/Family location: Home [facility] Provider location: [facility] All persons participating in visit: Patient Heidemarie Goodnow and [facility] Jamie McMannes    Confirmed patient's address: Yes  Confirmed patient's phone number: Yes  Any changes to demographics: No   Confirmed patient's insurance: Yes  Any changes to patient's insurance: No   Discussed confidentiality: At previous visit  I connected with Janean Sark a video enabled telemedicine application and verified that I am speaking with the correct person using two identifiers.     I discussed the limitations of evaluation and management by telemedicine and the availability of in person appointments.  I discussed that the purpose of this visit is to provide behavioral health care while limiting exposure to the novel coronavirus.   Discussed there is a possibility of technology failure and discussed alternative modes of communication if that failure occurs.  I discussed that engaging in this video visit, they consent to the provision of behavioral healthcare and the services will be billed under their insurance.  Patient and/or legal guardian expressed understanding and consented to video visit: Yes   PRESENTING CONCERNS: Patient and/or family reports the following symptoms/concerns: Pt states she is feeling a big decrease in symptoms of anxiety and depression, using Zoloft and self-coping strategies. She needs additional refills as she is waiting to get her initial appointment with PCP.  Duration of problem: Over one year; Severity of problem: mild  STRENGTHS (Protective Factors/Coping Skills): Supportive family; open to tratment  GOALS  ADDRESSED: Patient will: 1.  Maintain reduction of symptoms of: anxiety and depression  2.  Demonstrate ability to: Increase motivation to adhere to plan of care  INTERVENTIONS: Interventions utilized:  Medication Monitoring Standardized Assessments completed: GAD-7 and PHQ 9  ASSESSMENT: Patient currently experiencing Adjustment disorder with mixed anxiety and depressed mood.   Patient may benefit from continued brief therapeutic interventions.  PLAN: 1. Follow up with behavioral health clinician on : As needed 2. Behavioral recommendations:  -Continue taking Zoloft as prescribed -Continue using daily self-coping strategies, as needed -Call future PCP office today to inquire (again) about setting up initial appointment 3. Referral(s): Integrated [employer] (In Clinic)  I discussed the assessment and treatment plan with the patient and/or parent/guardian. They were provided an opportunity to ask questions and all were answered. They agreed with the plan and demonstrated an understanding of the instructions.   They were advised to call back or seek an in-person evaluation if the symptoms worsen or if the condition fails to improve as anticipated.  Valetta Close [facility]  Depression screen [facility] 2/9 03/19/2019 03/05/2019 02/17/2019 04/10/2018 02/14/2018  Decreased Interest 0 1 3 1 1   Down, Depressed, Hopeless 0 0 3 0 0  PHQ - 2 Score 0 1 6 1 1   Altered sleeping 1 1 3  0 3  Tired, decreased energy 1 3 3 2 3   Change in appetite 0 1 3 2 2   Feeling bad or failure about yourself  0 1 1 0 0  Trouble concentrating 1 2 1  0 0  Moving slowly or fidgety/restless 1 0 3 2 2   Suicidal thoughts 0 0 0 0 0  PHQ-9 Score 4 9 20 7 11   ] GAD 7 : Generalized Anxiety Score  03/19/2019 03/05/2019 02/17/2019 04/10/2018  Nervous, Anxious, on Edge 1 1 3 1   Control/stop worrying 1 2 3 1   Worry too much - different things 1 2 3 2   Trouble relaxing 1 2 2 2   Restless 1 1 2 1   Easily annoyed or  irritable 0 1 3 3   Afraid - awful might happen 0 1 3 1   Total GAD 7 Score 5 10 19  11

## 2019-03-24 ENCOUNTER — Other Ambulatory Visit: Payer: Self-pay | Admitting: Advanced Practice Midwife

## 2019-03-24 MED ORDER — SERTRALINE HCL 50 MG PO TABS: 50.0000 mg | ORAL_TABLET | Freq: Every day | ORAL | 1 refills | Status: DC

## 2019-04-06 ENCOUNTER — Ambulatory Visit: Payer: Medicaid Other | Admitting: Family Medicine

## 2019-04-20 ENCOUNTER — Telehealth: Payer: Self-pay | Admitting: Family Medicine

## 2019-04-20 NOTE — Telephone Encounter (Signed)
The patient would like sertraline and amlodipine blood pressure medication refilled. Visits the [facility] [facility] village.

## 2019-04-20 NOTE — Telephone Encounter (Signed)
Called pt and pt informed me that she needed a refill on her Sertaline and Norvasc.  Per chart review pt has an appt with PCP tomorrow on 04/22/19.  Pt stated that she was going to have to reschedule that appt.  I advised pt that she should not reschedule due to needing a refill on her medications and to ask if they can do a virtual appt.  I informed pt that we can not refill those medications because we can not manage her on them.  Pt verbalized understanding.    Addison Naegeli, RN 04/20/19

## 2019-04-20 NOTE — Telephone Encounter (Signed)
The patient would like sertraline and amlodipine blood pressure medication refilled. Visits the [facility] village. 

## 2019-04-22 ENCOUNTER — Ambulatory Visit: Payer: Medicaid Other | Admitting: Family Medicine

## 2019-04-29 ENCOUNTER — Other Ambulatory Visit: Payer: Self-pay

## 2019-04-29 ENCOUNTER — Encounter: Payer: Self-pay | Admitting: Family Medicine

## 2019-04-29 ENCOUNTER — Ambulatory Visit (INDEPENDENT_AMBULATORY_CARE_PROVIDER_SITE_OTHER): Payer: Medicaid Other | Admitting: Family Medicine

## 2019-04-29 VITALS — BP 118/80 | HR 83 | Wt 230.4 lb

## 2019-04-29 DIAGNOSIS — O99345 Other mental disorders complicating the puerperium: Secondary | ICD-10-CM | POA: Diagnosis not present

## 2019-04-29 DIAGNOSIS — J4520 Mild intermittent asthma, uncomplicated: Secondary | ICD-10-CM

## 2019-04-29 DIAGNOSIS — Z7689 Persons encountering health services in other specified circumstances: Secondary | ICD-10-CM

## 2019-04-29 DIAGNOSIS — Z8659 Personal history of other mental and behavioral disorders: Secondary | ICD-10-CM | POA: Insufficient documentation

## 2019-04-29 DIAGNOSIS — F53 Postpartum depression: Secondary | ICD-10-CM | POA: Diagnosis not present

## 2019-04-29 DIAGNOSIS — I10 Essential (primary) hypertension: Secondary | ICD-10-CM | POA: Diagnosis not present

## 2019-04-29 MED ORDER — SERTRALINE HCL 50 MG PO TABS: 50.0000 mg | ORAL_TABLET | Freq: Every day | ORAL | 1 refills | Status: DC

## 2019-04-29 MED ORDER — BUDESONIDE-FORMOTEROL FUMARATE 80-4.5 MCG/ACT IN AERO: 2.0000 | INHALATION_SPRAY | RESPIRATORY_TRACT | 3 refills | Status: DC | PRN

## 2019-04-29 MED ORDER — AMLODIPINE BESYLATE 10 MG PO TABS: 10.0000 mg | ORAL_TABLET | Freq: Every day | ORAL | 1 refills | Status: DC

## 2019-04-29 MED ORDER — SERTRALINE HCL 50 MG PO TABS: 50.0000 mg | ORAL_TABLET | Freq: Every day | ORAL | 3 refills | Status: DC

## 2019-04-29 NOTE — Assessment & Plan Note (Signed)
Started on Sertraline 50mg  QD for Postpartum Depression by [facility] . Has had good control over symptoms with this medicine. Had recurrence of some of her symptoms recently when she had to decrease her dose due to running low on her prescription. Last PHQ-9 score noted to be 4 in Dec. 2020. PHQ-9 score 8 today. Denies SI/HI. Patient notes she may be interested in trial off of SSRI in the future, but not interested at this time. Recommended patient schedule follow up if desires discontinuation or if symptoms worsen. Patient understood and agreed to plan.

## 2019-04-29 NOTE — Progress Notes (Signed)
Subjective:    Patient ID: Bailey Carney, female    DOB: 1990-09-23, 29 y.o.   MRN: [id number]   CC: New Patient  HPI: PMHx: Past Medical History:  Diagnosis Date  . Asthma    does not use inhaler often  . Chlamydia   . Cholecystitis   . Chronic hypertension during pregnancy, antepartum 09/06/2017   [x]  Aspirin 81 mg daily after 12 weeks Current antihypertensives:  Labetalol   Baseline and surveillance labs (pulled in from [facility], refresh links as needed)  Lab Results Component Value Date  PLT 195 10/11/2017  CREATININE 0.62 10/11/2017  AST 40 10/11/2017  ALT 64 (H) 10/11/2017   Antenatal Testing CHTN - O10.919  Group I  BP < 140/90, no preeclampsia, AGA,  nml AFV, +/- meds    Group II BP > 140  . Genital herpes   . Hypertension    on meds   Chronic HTN: Currently Amlodipine 10mg  QD. Endorses compliance. Denies any headaches, vision changes, chest pain, SOB.  Gentital Herpes: Does not have outbreaks that often - last outbreak years ago.  Postpartum Depression: Treated with Sertraline 50mg  QD. This is helping. She would like to continue. Denies any HI/SI. Feels like it helps her be less irritated and mood has improved. Does note she had to cut down to half her medicine because she was almost out so she has been feeling a little "off" this past week. PHQ-9 score of 8 today. She notes she may be interested in stopping in the near future.    Surgical Hx: Past Surgical History:  Procedure Laterality Date  . NO PAST SURGERIES      Family Hx: Family History  Problem Relation Age of Onset  . Hypertension Mother   . Breast cancer Maternal Grandmother   . Hypertension Maternal Grandmother   . Cancer Maternal Grandmother   . Breast cancer Paternal Grandmother   . Cancer Paternal Grandmother   . Cancer Maternal Grandfather    Breast cancer in maternal and paternal grandmothers - late 45's/70's and 49's Liver cancer in maternal grandfather  Social Hx: Who lives at home: Her and her  son ) 04/30/2019  Who would speak for you about health care matters: Mom - 94's  04/30/2019 Transportation: car 04/30/2019 Important Relationships & Pets: None 04/30/2019  Current Stressors: None 04/30/2019 Work / Education:  05/02/2019 04/30/2019 Religious / Personal Beliefs: None 04/30/2019 Interests / Fun: Spending time with her child 04/30/2019 Tobacco: None Alcohol: Occasionally - Glass of wine every other night, liquor on special occasions  Illicit Drugs:  None   reports that she has never smoked. She has never used smokeless tobacco. She reports current alcohol use. She reports that she does not use drugs.  Medications: Amlodipine 10mg  QD  Albuterol PRN Sertraline 50mg  QD  ROS: Patient reports no  vision/ hearing changes,anorexia, weight change, fever, adenopathy, persistant / recurrent hoarseness, swallowing issues, chest pain, edema,persistant / recurrent cough, hemoptysis, dyspnea(rest, exertional, paroxysmal nocturnal), gastrointestinal  bleeding (melena, rectal bleeding), abdominal pain, excessive heart burn, GU symptoms(dysuria, hematuria, pyuria, voiding/incontinence  Issues) syncope, focal weakness, severe memory loss, concerning skin lesions, depression, anxiety, abnormal bruising/bleeding, major joint swelling, breast masses or abnormal vaginal bleeding.    Preventative Screening Colonoscopy: N/A Mammogram: N/A Pap test: 10/11/17 - negative DEXA: N/A Tetanus vaccine: 01/29/2018 Pneumonia vaccine: N/A Shingles vaccine: N/A Heart stress test: None Echocardiogram: None Xrays: Lumbar x-ary and chest x-rays that were negative CT/MRI: None Doppler U/S of UE: 01/2014 -  negative  LMP: 04/07/19 Contraception: None - not sexually active at this time. Uses condoms when sexually active.  Smoking status reviewed  Review of Systems   Objective:  BP 118/80   Pulse 83   Wt 230 lb 6.4 oz (104.5 kg)   LMP 04/07/2019 (Exact Date)   SpO2 97%   BMI 36.09  kg/m  Vitals and nursing note reviewed  General: well nourished, in no acute distress, sitting comfortably in exam chair HEENT: normocephalic, no scleral icterus or conjunctival pallor, no nasal discharge, moist mucous membranes Neck: supple, normal ROM Cardiac: RRR, clear S1 and S2, no murmurs, rubs, or gallops Respiratory: clear to auscultation bilaterally, no increased work of breathing Abdomen: soft, nontender, nondistended, Bowel sounds present Extremities: no edema or cyanosis. Warm, well perfused. Skin: warm and dry Neuro: alert and oriented, speech normal   Assessment & Plan:   Hypertension Blood pressure normotensive. Compliant with Amlodipine 10mg  QD.  - Refill provided - Continue current regimen   Asthma Intermittent. Rarely uses rescue inhaler. Requests refill as her albuterol inhaler was out of date. Discussed starting PRN Symbicort per GINA guidelines. Patient was amendable to this. Rx provided.  Postpartum depression Started on Sertraline 50mg  QD for Postpartum Depression by [facility] [employer] [facility]. Has had good control over symptoms with this medicine. Had recurrence of some of her symptoms recently when she had to decrease her dose due to running low on her prescription. Last PHQ-9 score noted to be 4 in Dec. 2020. PHQ-9 score 8 today. Denies SI/HI. Patient notes she may be interested in trial off of SSRI in the future, but not interested at this time. Recommended patient schedule follow up if desires discontinuation or if symptoms worsen. Patient understood and agreed to plan.   Mina Marble, DO [facility], PGY2 04/29/19

## 2019-04-29 NOTE — Assessment & Plan Note (Signed)
Blood pressure normotensive. Compliant with Amlodipine 10mg  QD.  - Refill provided - Continue current regimen

## 2019-04-29 NOTE — Assessment & Plan Note (Signed)
Intermittent. Rarely uses rescue inhaler. Requests refill as her albuterol inhaler was out of date. Discussed starting PRN Symbicort per GINA guidelines. Patient was amendable to this. Rx provided.

## 2019-04-29 NOTE — Patient Instructions (Signed)
It was great meeting you!  Lets plan to see each other sometime in June 2022 for annual exam and papsmear. Sooner if you need anything.  Take care,  Dr. Mauri Reading

## 2019-05-21 DIAGNOSIS — I10 Essential (primary) hypertension: Secondary | ICD-10-CM | POA: Insufficient documentation

## 2019-05-21 DIAGNOSIS — J45909 Unspecified asthma, uncomplicated: Secondary | ICD-10-CM | POA: Insufficient documentation

## 2019-07-06 ENCOUNTER — Other Ambulatory Visit: Payer: Self-pay | Admitting: Family Medicine

## 2019-07-06 DIAGNOSIS — I10 Essential (primary) hypertension: Secondary | ICD-10-CM

## 2019-08-22 ENCOUNTER — Telehealth: Payer: Self-pay | Admitting: Family Medicine

## 2019-08-22 NOTE — Telephone Encounter (Signed)
**  After Hours/ Emergency Line Call**  Received a call to report that Waymon Budge was complaining of approximately 1 month of bilateral upper extremity and facial paresthesias.  Yesterday they were occurring every 1/2 hour and lasting less than 1 minute.  She denies facial droop, dysarthria, upper extremity or lower extremity weakness.  No headache.  Paresthesia only occurs in the arms and face, not in the lower extremities.  She did endorse that it occurred initially a month ago in the lower extremities but not since then.  No bowel or bladder incontinence, no saddle anesthesia.  Patient cannot recall any recent trauma..    Recommended that patient be seen in our access to care clinic on Monday (PCP not available till June).  Advised patient that if she experience any of the above red flag symptoms, to come to the ED.[name]  Red flags discussed.  Will forward to PCP.  Frederic Jericho, MD PGY-2, [facility] 08/22/2019 2:08 PM

## 2019-08-24 ENCOUNTER — Other Ambulatory Visit: Payer: Self-pay

## 2019-08-24 ENCOUNTER — Ambulatory Visit (INDEPENDENT_AMBULATORY_CARE_PROVIDER_SITE_OTHER): Payer: Medicaid Other | Admitting: Family Medicine

## 2019-08-24 VITALS — BP 110/64 | HR 72 | Ht 67.0 in | Wt 229.5 lb

## 2019-08-24 DIAGNOSIS — R202 Paresthesia of skin: Secondary | ICD-10-CM | POA: Diagnosis present

## 2019-08-24 NOTE — Patient Instructions (Signed)
It was great to see you!  Our plans for today:  - We are checking some labs today, we will let you know these results. - Keep a log of when you have these symptoms, how long they last and any activities that precipitate them.  - Come back in 1 month for follow up and bring your log with you.   Take care and seek immediate care sooner if you develop any concerns.   Dr. [facility]

## 2019-08-24 NOTE — Progress Notes (Signed)
    SUBJECTIVE:   CHIEF COMPLAINT / HPI:   Paresthesias Endorses 3-4 week h/o intermittent bilateral upper leg and facial numbness and tingling with addition of upper arm paresthesias 1 week ago. Facial numbness is located along bilateral jawline. This has been intermittent but has progressed in frequency over the last week. Associated with generalized fatigue but no focal weakness. Denies pain, swelling, redness, saddle anesthesia, bowel or bladder incontinence, difficulties walking or speaking. Has chronic headache that hasn't changed. Denies known trauma, medication changes, h/o vitamin/mineral deficiencies or thyroid disorder. Denies known FH of autoimmune conditions.  PERTINENT  PMH / PSH: HTN, asthma, postpartum depression  OBJECTIVE:   BP 110/64   Pulse 72   Ht '5\' 7"'$  (1.702 m)   Wt 229 lb 8 oz (104.1 kg)   LMP 08/02/2019   SpO2 99%   BMI 35.94 kg/m   Gen: obese, in NAD MSK: Full ROM, strength 5/5 to U/LE bilaterally, deep reflexes intact, normal gait.  No edema.  Neuro: Alert and oriented, speech normal. Optic field normal. PERRL, Extraocular movements intact.  Intact symmetric sensation to light touch of face and extremities bilaterally.  Hearing grossly intact bilaterally.  Tongue protrudes normally with no deviation.  Shoulder shrug, smile symmetric. Finger to nose normal.   Depression screen [facility] 2/9 08/24/2019 04/29/2019 04/29/2019  Decreased Interest '3 1 1  '$ Down, Depressed, Hopeless '1 1 1  '$ PHQ - 2 Score '4 2 2  '$ Altered sleeping 3 2 -  Tired, decreased energy 3 1 -  Change in appetite 0 0 -  Feeling bad or failure about yourself  0 1 -  Trouble concentrating 1 1 -  Moving slowly or fidgety/restless 1 1 -  Suicidal thoughts 0 0 -  PHQ-9 Score 12 8 -  Difficult doing work/chores Somewhat difficult - -  Some recent data might be hidden     ASSESSMENT/PLAN:   Paresthesias Unclear etiology. Paresthesias are without pain and do not follow neural distribution. Neuro exam  normal and without current numbness or focal findings. Will obtain vitamin B12 level, TSH and electrolytes. Also with h/o mild anemia, will check CBC. Recommend patient keep log of when she feels symptoms with associated timing or activities to assess for potential trigger. Given proximal distribution, recommend checking ESR, CRP, ANA at follow up if still present to r/o autoimmune conditions though would be unlikely presentation and with lack of FH. Also with elevated PHQ9 today, consider further discussion on f/u.     Rory Percy, [place]

## 2019-08-25 LAB — CBC
Hematocrit: 38.2 % (ref 34.0–46.6)
Hemoglobin: 12.4 g/dL (ref 11.1–15.9)
MCH: 27.4 pg (ref 26.6–33.0)
MCHC: 32.5 g/dL (ref 31.5–35.7)
MCV: 84 fL (ref 79–97)
Platelets: 193 10*3/uL (ref 150–450)
RBC: 4.53 x10E6/uL (ref 3.77–5.28)
RDW: 13 % (ref 11.7–15.4)
WBC: 7.9 10*3/uL (ref 3.4–10.8)

## 2019-08-25 LAB — BASIC METABOLIC PANEL
BUN/Creatinine Ratio: 19 (ref 9–23)
BUN: 16 mg/dL (ref 6–20)
CO2: 24 mmol/L (ref 20–29)
Calcium: 9.5 mg/dL (ref 8.7–10.2)
Chloride: 102 mmol/L (ref 96–106)
Creatinine, Ser: 0.85 mg/dL (ref 0.57–1.00)
GFR calc Af Amer: 109 mL/min/{1.73_m2} (ref >59)
GFR calc non Af Amer: 95 mL/min/{1.73_m2} (ref >59)
Glucose: 79 mg/dL (ref 65–99)
Potassium: 4.4 mmol/L (ref 3.5–5.2)
Sodium: 137 mmol/L (ref 134–144)

## 2019-08-25 LAB — VITAMIN B12: Vitamin B-12: 389 pg/mL (ref 232–1245)

## 2019-08-25 LAB — TSH: TSH: 0.95 u[IU]/mL (ref 0.450–4.500)

## 2019-08-26 DIAGNOSIS — R202 Paresthesia of skin: Secondary | ICD-10-CM | POA: Insufficient documentation

## 2019-08-26 NOTE — Assessment & Plan Note (Signed)
Unclear etiology. Paresthesias are without pain and do not follow neural distribution. Neuro exam normal and without current numbness or focal findings. Will obtain vitamin B12 level, TSH and electrolytes. Also with h/o mild anemia, will check CBC. Recommend patient keep log of when she feels symptoms with associated timing or activities to assess for potential trigger. Given proximal distribution, recommend checking ESR, CRP, ANA at follow up if still present to r/o autoimmune conditions though would be unlikely presentation and with lack of FH. Also with elevated PHQ9 today, consider further discussion on f/u.

## 2019-12-26 ENCOUNTER — Other Ambulatory Visit: Payer: Self-pay | Admitting: Family Medicine

## 2019-12-26 DIAGNOSIS — F53 Postpartum depression: Secondary | ICD-10-CM

## 2020-03-12 ENCOUNTER — Other Ambulatory Visit: Payer: Self-pay

## 2020-03-12 ENCOUNTER — Emergency Department (HOSPITAL_COMMUNITY)
Admission: EM | Admit: 2020-03-12 | Discharge: 2020-03-13 | Disposition: A | Discharge status: Home / Self Care | Payer: Medicaid Other | Attending: Emergency Medicine | Admitting: Emergency Medicine

## 2020-03-12 ENCOUNTER — Encounter (HOSPITAL_COMMUNITY): Payer: Self-pay | Admitting: *Deleted

## 2020-03-12 ENCOUNTER — Emergency Department (HOSPITAL_COMMUNITY): Payer: Medicaid Other

## 2020-03-12 DIAGNOSIS — I10 Essential (primary) hypertension: Secondary | ICD-10-CM | POA: Diagnosis not present

## 2020-03-12 DIAGNOSIS — W010XXA Fall on same level from slipping, tripping and stumbling without subsequent striking against object, initial encounter: Secondary | ICD-10-CM | POA: Insufficient documentation

## 2020-03-12 DIAGNOSIS — J45909 Unspecified asthma, uncomplicated: Secondary | ICD-10-CM | POA: Diagnosis not present

## 2020-03-12 DIAGNOSIS — R52 Pain, unspecified: Secondary | ICD-10-CM

## 2020-03-12 DIAGNOSIS — Z79899 Other long term (current) drug therapy: Secondary | ICD-10-CM | POA: Insufficient documentation

## 2020-03-12 DIAGNOSIS — M25572 Pain in left ankle and joints of left foot: Secondary | ICD-10-CM | POA: Diagnosis not present

## 2020-03-12 NOTE — ED Triage Notes (Signed)
The pt is c/o lt foot pain   She tripped coming out of a bouncy house with her son approx 1800  Pain since then.  lmp now  She had tylenol aroung 2000

## 2020-03-13 ENCOUNTER — Emergency Department (HOSPITAL_COMMUNITY): Payer: Medicaid Other

## 2020-03-13 MED ORDER — NAPROXEN 500 MG PO TABS: 500.0000 mg | ORAL_TABLET | Freq: Two times a day (BID) | ORAL | 0 refills | Status: DC | PRN

## 2020-03-13 NOTE — ED Notes (Signed)
Pt refusing vitals.

## 2020-03-13 NOTE — Progress Notes (Signed)
Orthopedic Tech Progress Note Patient Details:  Bailey Carney 10/28/1990 [id number]  Ortho Devices Type of Ortho Device: Crutches, CAM walker Ortho Device/Splint Location: lle Ortho Device/Splint Interventions: Ordered, Application, Adjustment   Post Interventions Patient Tolerated: Well Instructions Provided: Care of device, Adjustment of device   Trinna Post 03/13/2020, 2:43 AM

## 2020-03-13 NOTE — ED Provider Notes (Signed)
[facility] EMERGENCY DEPARTMENT Provider Note   CSN: [id number] Arrival date & time: 03/12/20  2121     History Chief Complaint  Patient presents with  . Foot Pain    Bailey Carney is a 29 y.o. female with a history of hypertension who presents to the emergency department with complaints of left foot and ankle pain status post injury earlier this evening.  Patient states that she was stepping out of a bouncy house when she landed wrong on the foot causing her to fall.  Denies head injury or loss of consciousness. States pain is to the left lateral foot/ankle, constant, worse with movement and attempt to weight-bear which she has not done since the injury.  No other areas of injury.  She denies numbness, tingling, or weakness.  Denies being pregnant.   HPI     Past Medical History:  Diagnosis Date  . Asthma    does not use inhaler often  . Chlamydia   . Cholecystitis   . Chronic hypertension during pregnancy, antepartum 09/06/2017   [x]  Aspirin 81 mg daily after 12 weeks Current antihypertensives:  Labetalol   Baseline and surveillance labs (pulled in from [facility], refresh links as needed)  Lab Results Component Value Date  PLT 195 10/11/2017  CREATININE 0.62 10/11/2017  AST 40 10/11/2017  ALT 64 (H) 10/11/2017   Antenatal Testing CHTN - O10.919  Group I  BP < 140/90, no preeclampsia, AGA,  nml AFV, +/- meds    Group II BP > 140  . Genital herpes   . Hypertension    on meds    Patient Active Problem List   Diagnosis Date Noted  . Paresthesias 08/26/2019  . Asthma 05/21/2019  . Hypertension 05/21/2019  . Postpartum depression 04/29/2019    Past Surgical History:  Procedure Laterality Date  . NO PAST SURGERIES       OB History    Gravida  1   Para  1   Term  1   Preterm  0   AB  0   Living  1     SAB  0   TAB  0   Ectopic  0   Multiple  0   Live Births  1           Family History  Problem Relation Age of Onset  . Hypertension  Mother   . Breast cancer Maternal Grandmother   . Hypertension Maternal Grandmother   . Cancer Maternal Grandmother   . Breast cancer Paternal Grandmother   . Cancer Paternal Grandmother   . Cancer Maternal Grandfather     Social History   Tobacco Use  . Smoking status: Never Smoker  . Smokeless tobacco: Never Used  Vaping Use  . Vaping Use: Never used  Substance Use Topics  . Alcohol use: Yes    Comment: socially  . Drug use: No    Home Medications Prior to Admission medications   Medication Sig Start Date End Date Taking? Authorizing Provider  albuterol (PROVENTIL HFA;VENTOLIN HFA) 108 (90 Base) MCG/ACT inhaler Inhale 1-2 puffs into the lungs every 6 (six) hours as needed for wheezing or shortness of breath. 11/14/17   Burleson, 01/14/18, NP  amLODipine (NORVASC) 10 MG tablet Take 1 tablet by mouth daily 07/07/19   Mullis, Kiersten P, DO  budesonide-formoterol (SYMBICORT) 80-4.5 MCG/ACT inhaler Inhale 2 puffs into the lungs as needed. 04/29/19   Mullis, Kiersten P, DO  ipratropium (ATROVENT) 0.03 % nasal spray  Place into the nose. 03/24/19   [provider]  sertraline (ZOLOFT) 50 MG tablet Take 1 tablet by mouth once daily 12/28/19   Mullis, Kiersten P, DO    Allergies    Amoxicillin  Review of Systems   Review of Systems  Constitutional: Negative for chills and fever.  Respiratory: Negative for shortness of breath.   Cardiovascular: Negative for chest pain.  Gastrointestinal: Negative for abdominal pain.  Musculoskeletal: Positive for arthralgias. Negative for gait problem and neck pain.  Neurological: Negative for weakness, numbness and headaches.  All other systems reviewed and are negative.   Physical Exam Updated Vital Signs BP 135/75   Pulse 79   Temp 97.9 F (36.6 C)   Resp 17   Ht 5\' 7"  (1.702 m)   Wt 104.3 kg   LMP 03/12/2020   SpO2 100%   BMI 36.02 kg/m   Physical Exam Vitals and nursing note reviewed.  Constitutional:      General: She is  not in acute distress.    Appearance: She is well-developed. She is not ill-appearing or toxic-appearing.  HENT:     Head: Normocephalic and atraumatic.     Comments: No raccoon eyes or battle sign. Eyes:     General:        Right eye: No discharge.        Left eye: No discharge.     Conjunctiva/sclera: Conjunctivae normal.     Pupils: Pupils are equal, round, and reactive to light.  Cardiovascular:     Rate and Rhythm: Normal rate and regular rhythm.     Pulses:          Dorsalis pedis pulses are 2+ on the right side and 2+ on the left side.       Posterior tibial pulses are 2+ on the right side and 2+ on the left side.  Pulmonary:     Effort: Pulmonary effort is normal. No respiratory distress.     Breath sounds: Normal breath sounds. No wheezing, rhonchi or rales.  Chest:     Chest wall: No tenderness.  Abdominal:     General: There is no distension.     Palpations: Abdomen is soft.     Tenderness: There is no abdominal tenderness.  Musculoskeletal:     Cervical back: Normal range of motion and neck supple. No tenderness.     Comments: Upper extremities: Intact active range of motion.  No point/focal bony tenderness. Back: No midline tenderness  Lower extremities: Patient has soft tissue swelling to the left lateral ankle/foot.  No open wounds or significant ecchymosis.  She has intact active range of motion throughout the lower extremities with the exception of left ankle plantar dorsiflexion being mildly limited.  She is tender to the left lateral malleolus, lateral ankle ligaments, base of the fifth, and the diffuse forefoot.  Otherwise nontender.  Neurovascularly intact distally.  Skin:    General: Skin is warm and dry.     Capillary Refill: Capillary refill takes less than 2 seconds.     Findings: No rash.  Neurological:     Mental Status: She is alert.     Comments: Alert. Clear speech. Sensation grossly intact to bilateral lower extremities. 5/5 strength with  plantar/dorsiflexion bilaterally  Psychiatric:        Mood and Affect: Mood normal.        Behavior: Behavior normal.     ED Results / Procedures / Treatments   Labs (all labs ordered are  listed, but only abnormal results are displayed) Labs Reviewed - No data to display  EKG None  Radiology DG Ankle Complete Left  Result Date: 03/13/2020 CLINICAL DATA:  Initial evaluation for acute trauma, fall. EXAM: LEFT ANKLE COMPLETE - 3+ VIEW COMPARISON:  Comparison made with prior radiograph report from 06/09/2001. FINDINGS: No acute fracture or dislocation. Ankle mortise approximated. Smoothly corticated osseous density at the lateral malleolus favored to reflect a chronic finding, potentially related to prior trauma. There was an apparent avulsion type injury at this location on prior radiograph from 2003. Mild soft tissue swelling seen about the ankle. IMPRESSION: 1. No acute fracture or dislocation. 2. Smoothly corticated osseous density at the lateral malleolus, favored to reflect a chronic finding, potentially related to prior trauma. 3. Mild soft tissue swelling about the ankle. Electronically Signed   By: Rise Mu M.D.   On: 03/13/2020 00:56   DG Foot Complete Left  Result Date: 03/12/2020 CLINICAL DATA:  Left foot pain status post tripping. EXAM: LEFT FOOT - COMPLETE 3+ VIEW COMPARISON:  None. FINDINGS: There is no evidence of fracture or dislocation. There is no evidence of arthropathy or other focal bone abnormality. Soft tissues are unremarkable. IMPRESSION: No acute displaced fracture or dislocation of the left foot. Electronically Signed   By: Tish Frederickson M.D.   On: 03/12/2020 22:02    Procedures Procedures (including critical care time)  Medications Ordered in ED Medications - No data to display  ED Course  I have reviewed the triage vital signs and the nursing notes.  Pertinent labs & imaging results that were available during my care of the patient were  reviewed by me and considered in my medical decision making (see chart for details).    MDM Rules/Calculators/A&P                         Patient presents to the emergency department with left foot and ankle pain status post injury earlier this evening.  Nontoxic, vitals without significant abnormality.  Has some localized swelling and mild limitation in range of motion.  Left foot x-ray ordered by triage, I have also ordered a left ankle x-ray-I personally reviewed and interpreted imaging, no acute fracture or dislocation noted, additional findings as above.  Vascularly intact distally.  Plan for Cam walker and crutches.  Orthopedics follow-up.  Discussed PRICE and naproxen. Discussed results, treatment plan, need for follow-up, and return precautions with the patient. Provided opportunity for questions, patient confirmed understanding and is in agreement with plan.   Final Clinical Impression(s) / ED Diagnoses Final diagnoses:  Pain  Pain of joint of left ankle and foot    Rx / DC Orders ED Discharge Orders         Ordered    naproxen (NAPROSYN) 500 MG tablet  2 times daily PRN        03/13/20 0106           Cherly Anderson, PA-C 03/13/20 0112    Zadie Rhine, MD 03/13/20 [phone]

## 2020-03-13 NOTE — Discharge Instructions (Addendum)
Please read and follow all provided instructions.  You have been seen today for an injury to your left ankle/foot.   Tests performed today include: An x-ray of the affected area - does NOT show any broken bones or dislocations.  Vital signs. See below for your results today.   Home care instructions: -- *PRICE in the first 24-48 hours after injury: Protect (with brace, splint, sling), if given by your provider Rest Ice- Do not apply ice pack directly to your skin, place towel or similar between your skin and ice/ice pack. Apply ice for 20 min, then remove for 40 min while awake Compression- Wear brace, elastic bandage, splint as directed by your provider Elevate affected extremity above the level of your heart when not walking around for the first 24-48 hours   Medications:  - Naproxen is a nonsteroidal anti-inflammatory medication that will help with pain and swelling. Be sure to take this medication as prescribed with food, 1 pill every 12 hours,  It should be taken with food, as it can cause stomach upset, and more seriously, stomach bleeding. Do not take other nonsteroidal anti-inflammatory medications with this such as Advil, Motrin, Aleve, Mobic, Goodie Powder, or Motrin.    You make take Tylenol per over the counter dosing with these medications.   We have prescribed you new medication(s) today. Discuss the medications prescribed today with your pharmacist as they can have adverse effects and interactions with your other medicines including over the counter and prescribed medications. Seek medical evaluation if you start to experience new or abnormal symptoms after taking one of these medicines, seek care immediately if you start to experience difficulty breathing, feeling of your throat closing, facial swelling, or rash as these could be indications of a more serious allergic reaction   Follow-up instructions: Please follow-up with your primary care provider or the provided orthopedic  physician (bone specialist) if you continue to have significant pain in 1 week. In this case you may have a more severe injury that requires further care.   Return instructions:  Please return if your digits or extremity are numb or tingling, appear gray or blue, or you have severe pain (also elevate the extremity and loosen splint or wrap if you were given one) Please return if you have redness or fevers.  Please return to the Emergency Department if you experience worsening symptoms.  Please return if you have any other emergent concerns. Additional Information:  Your vital signs today were: BP 135/75   Pulse 79   Temp 97.9 F (36.6 C)   Resp 17   Ht 5\' 7"  (1.702 m)   Wt 104.3 kg   LMP 03/12/2020   SpO2 100%   BMI 36.02 kg/m  If your blood pressure (BP) was elevated above 135/85 this visit, please have this repeated by your doctor within one month. ---------------

## 2020-05-21 ENCOUNTER — Inpatient Hospital Stay (HOSPITAL_COMMUNITY)
Admission: AD | Admit: 2020-05-21 | Discharge: 2020-05-21 | Disposition: A | Discharge status: Home / Self Care | Payer: Medicaid Other | Attending: Obstetrics and Gynecology | Admitting: Obstetrics and Gynecology

## 2020-05-21 ENCOUNTER — Other Ambulatory Visit: Payer: Self-pay

## 2020-05-21 DIAGNOSIS — Z3202 Encounter for pregnancy test, result negative: Secondary | ICD-10-CM | POA: Insufficient documentation

## 2020-05-21 LAB — HCG, QUANTITATIVE, PREGNANCY: hCG, Beta Chain, Quant, S: <1 m[IU]/mL (ref <5)

## 2020-05-21 LAB — POCT PREGNANCY, URINE: Preg Test, Ur: NEGATIVE

## 2020-05-21 NOTE — Discharge Instructions (Signed)
Someone will call you later today with the results of your blood pregnancy test

## 2020-05-21 NOTE — MAU Note (Signed)
Pt reports to mau with c/o lower abd cramping for the past 3 weeks.  Pt states LMP was 1/26.  Pt reports she had 2 pos preg test yesterday and 4 negative tests today.  Pt denies vag bleeding today.

## 2020-05-21 NOTE — MAU Provider Note (Signed)
Event Date/Time   First Provider Initiated Contact with Patient 05/21/20 1541      S Ms. FARHEEN PFAHLER is a 30 y.o. G1P1001 patient who presents to MAU today with complaint of cramping and spotting. She states that she had a period on 05/13/2019. However she has had cramping and spotting for the last few days. She took a pregnancy test at home that was positive and then she took several more and they all negative.    O BP 125/83 (BP Location: Right Arm)   Pulse 68   Temp 98.5 F (36.9 C) (Oral)   Resp 17   LMP 05/10/2020   SpO2 98%  Physical Exam Vitals and nursing note reviewed.  Constitutional:      General: She is not in acute distress. HENT:     Head: Normocephalic.  Cardiovascular:     Rate and Rhythm: Normal rate.  Pulmonary:     Effort: Pulmonary effort is normal.  Neurological:     Mental Status: She is alert and oriented to person, place, and time.  Psychiatric:        Mood and Affect: Mood normal.        Behavior: Behavior normal.      Results for orders placed or performed during the hospital encounter of 05/21/20 (from the past 24 hour(s))  Pregnancy, urine POC     Status: None   Collection Time: 05/21/20  3:26 PM  Result Value Ref Range   Preg Test, Ur NEGATIVE NEGATIVE  hCG, quantitative, pregnancy     Status: None   Collection Time: 05/21/20  3:55 PM  Result Value Ref Range   hCG, Beta Chain, Quant, S <1 <5 mIU/mL    A Medical screening exam complete 1. Pregnancy test negative      P Discharge from MAU in stable condition Patient given the option of transfer to [facility] for further evaluation or seek care in outpatient facility of choice List of options for follow-up given  Warning signs for worsening condition that would warrant emergency follow-up discussed Patient may return to MAU as needed   Thressa Sheller DNP, CNM  05/21/20  4:58 PM     5:44 PM patient called and identified with 2 patient identifiers. Reviewed results from [facility] done  earlier today and that it is less than one, which is considered not pregnant. Patient verbalizes understanding.   Thressa Sheller DNP, CNM  05/21/20  5:45 PM

## 2020-10-06 ENCOUNTER — Other Ambulatory Visit: Payer: Self-pay

## 2020-10-06 ENCOUNTER — Encounter (HOSPITAL_COMMUNITY): Payer: Self-pay | Admitting: Emergency Medicine

## 2020-10-06 ENCOUNTER — Emergency Department (HOSPITAL_COMMUNITY)
Admission: EM | Admit: 2020-10-06 | Discharge: 2020-10-06 | Disposition: A | Discharge status: Home / Self Care | Payer: Medicaid Other | Attending: Emergency Medicine | Admitting: Emergency Medicine

## 2020-10-06 ENCOUNTER — Emergency Department (HOSPITAL_COMMUNITY): Payer: Medicaid Other

## 2020-10-06 DIAGNOSIS — J45909 Unspecified asthma, uncomplicated: Secondary | ICD-10-CM | POA: Diagnosis not present

## 2020-10-06 DIAGNOSIS — Z79899 Other long term (current) drug therapy: Secondary | ICD-10-CM | POA: Diagnosis not present

## 2020-10-06 DIAGNOSIS — I10 Essential (primary) hypertension: Secondary | ICD-10-CM | POA: Diagnosis not present

## 2020-10-06 DIAGNOSIS — R1011 Right upper quadrant pain: Secondary | ICD-10-CM | POA: Insufficient documentation

## 2020-10-06 DIAGNOSIS — R1010 Upper abdominal pain, unspecified: Secondary | ICD-10-CM

## 2020-10-06 DIAGNOSIS — R1012 Left upper quadrant pain: Secondary | ICD-10-CM | POA: Diagnosis not present

## 2020-10-06 LAB — POC URINE PREG, ED: Preg Test, Ur: NEGATIVE

## 2020-10-06 MED ORDER — PANTOPRAZOLE SODIUM 20 MG PO TBEC: 20.0000 mg | DELAYED_RELEASE_TABLET | Freq: Every day | ORAL | 0 refills | Status: DC

## 2020-10-06 MED ORDER — SUCRALFATE 1 G PO TABS: 1.0000 g | ORAL_TABLET | Freq: Four times a day (QID) | ORAL | 0 refills | Status: DC

## 2020-10-06 NOTE — ED Triage Notes (Signed)
Patient arrives complaining of ongoing RUQ pain x1 month that is intermittent. Patient states she has had gallbladder issues since being pregnant. Patient states that last night she began having constant LUQ pain and pain during bowel movements, but denies changes in bowel movements.

## 2020-10-06 NOTE — ED Provider Notes (Signed)
Emergency Medicine Provider Triage Evaluation Note  Bailey Carney , a 30 y.o. female  was evaluated in triage.  Pt complains of abd pain.  Review of Systems  Positive: RUQ abd pain, LUQ pain, nausea Negative: Fever, cp, sob, dysuria  Physical Exam  BP (!) 147/104 (BP Location: Left Arm)   Pulse 71   Temp 98.5 F (36.9 C) (Oral)   Resp 15   Ht 5\' 7"  (1.702 m)   Wt 104.3 kg   SpO2 100%   BMI 36.02 kg/m  Gen:   Awake, no distress   Resp:  Normal effort  MSK:   Moves extremities without difficulty  Other:  RUQ tenderness, no guarding or rebound.  Mild epigastric tenderness.  Negative Murphy  Medical Decision Making  Medically screening exam initiated at 7:30 PM.  Appropriate orders placed.  CHERRISE OCCHIPINTI was informed that the remainder of the evaluation will be completed by another provider, this initial triage assessment does not replace that evaluation, and the importance of remaining in the ED until their evaluation is complete.  Hx of cholelithiasis, having recurrent RUQ pain x1 month, and now LUQ pain x 2 days.  Currently on her menstruation.     Waymon Budge, PA-C 10/06/20 1931    [place]orton, 10/08/20, DO 10/06/20 2223

## 2020-10-06 NOTE — ED Provider Notes (Signed)
[facility]-EMERGENCY DEPT Provider Note   CSN: [id number] Arrival date & time: 10/06/20  1846     History Chief Complaint  Patient presents with   Abdominal Pain    Bailey Carney is a 30 y.o. female.  30 year old female who presents with several week history of right upper quadrant and left upper quadrant dull discomfort.  Has known history of gallstones.  States pain gets worse when she eats greasy or fatty foods.  No associated fever or chills.  No emesis currently.  She is currently on admission with cycle.  No treatment use prior to arrival.  She is currently pain-free      Past Medical History:  Diagnosis Date   Asthma    does not use inhaler often   Chlamydia    Cholecystitis    Chronic hypertension during pregnancy, antepartum 09/06/2017   [x]  Aspirin 81 mg daily after 12 weeks Current antihypertensives:  Labetalol   Baseline and surveillance labs (pulled in from [facility], refresh links as needed)  Lab Results Component Value Date  PLT 195 10/11/2017  CREATININE 0.62 10/11/2017  AST 40 10/11/2017  ALT 64 (H) 10/11/2017   Antenatal Testing CHTN - O10.919  Group I  BP < 140/90, no preeclampsia, AGA,  nml AFV, +/- meds    Group II BP > 140   Genital herpes    Hypertension    on meds    Patient Active Problem List   Diagnosis Date Noted   Paresthesias 08/26/2019   Asthma 05/21/2019   Hypertension 05/21/2019   Postpartum depression 04/29/2019    Past Surgical History:  Procedure Laterality Date   NO PAST SURGERIES       OB History     Gravida  1   Para  1   Term  1   Preterm  0   AB  0   Living  1      SAB  0   IAB  0   Ectopic  0   Multiple  0   Live Births  1           Family History  Problem Relation Age of Onset   Hypertension Mother    Breast cancer Maternal Grandmother    Hypertension Maternal Grandmother    Cancer Maternal Grandmother    Breast cancer Paternal Grandmother    Cancer Paternal Grandmother     Cancer Maternal Grandfather     Social History   Tobacco Use   Smoking status: Never   Smokeless tobacco: Never  Vaping Use   Vaping Use: Never used  Substance Use Topics   Alcohol use: Yes    Comment: socially   Drug use: No    Home Medications Prior to Admission medications   Medication Sig Start Date End Date Taking? Authorizing Provider  albuterol (PROVENTIL HFA;VENTOLIN HFA) 108 (90 Base) MCG/ACT inhaler Inhale 1-2 puffs into the lungs every 6 (six) hours as needed for wheezing or shortness of breath. 11/14/17   Burleson, 01/14/18, NP  amLODipine (NORVASC) 10 MG tablet Take 1 tablet by mouth daily 07/07/19   Mullis, Kiersten P, DO  budesonide-formoterol (SYMBICORT) 80-4.5 MCG/ACT inhaler Inhale 2 puffs into the lungs as needed. 04/29/19   Mullis, Kiersten P, DO  ipratropium (ATROVENT) 0.03 % nasal spray Place into the nose. 03/24/19   [provider]  naproxen (NAPROSYN) 500 MG tablet Take 1 tablet (500 mg total) by mouth 2 (two) times daily as needed for moderate pain.  03/13/20   Petrucelli, Samantha R, PA-C  sertraline (ZOLOFT) 50 MG tablet Take 1 tablet by mouth once daily 12/28/19   Mullis, Kiersten P, DO    Allergies    Amoxicillin  Review of Systems   Review of Systems  All other systems reviewed and are negative.  Physical Exam Updated Vital Signs BP (!) 156/108   Pulse 63   Temp 98.9 F (37.2 C) (Oral)   Resp 16   Ht 1.702 m (5\' 7" )   Wt 104.3 kg   SpO2 100%   BMI 36.02 kg/m   Physical Exam Vitals and nursing note reviewed.  Constitutional:      General: She is not in acute distress.    Appearance: Normal appearance. She is well-developed. She is not toxic-appearing.  HENT:     Head: Normocephalic and atraumatic.  Eyes:     General: Lids are normal.     Conjunctiva/sclera: Conjunctivae normal.     Pupils: Pupils are equal, round, and reactive to light.  Neck:     Thyroid: No thyroid mass.     Trachea: No tracheal deviation.  Cardiovascular:      Rate and Rhythm: Normal rate and regular rhythm.     Heart sounds: Normal heart sounds. No murmur heard.   No gallop.  Pulmonary:     Effort: Pulmonary effort is normal. No respiratory distress.     Breath sounds: Normal breath sounds. No stridor. No decreased breath sounds, wheezing, rhonchi or rales.  Abdominal:     General: There is no distension.     Palpations: Abdomen is soft.     Tenderness: There is no abdominal tenderness. There is no rebound.  Musculoskeletal:        General: No tenderness. Normal range of motion.     Cervical back: Normal range of motion and neck supple.  Skin:    General: Skin is warm and dry.     Findings: No abrasion or rash.  Neurological:     Mental Status: She is alert and oriented to person, place, and time. Mental status is at baseline.     GCS: GCS eye subscore is 4. GCS verbal subscore is 5. GCS motor subscore is 6.     Cranial Nerves: Cranial nerves are intact. No cranial nerve deficit.     Sensory: No sensory deficit.     Motor: Motor function is intact.  Psychiatric:        Attention and Perception: Attention normal.        Speech: Speech normal.        Behavior: Behavior normal.    ED Results / Procedures / Treatments   Labs (all labs ordered are listed, but only abnormal results are displayed) Labs Reviewed  POC URINE PREG, ED    EKG None  Radiology Abdomen Limited  Result Date: 10/06/2020 CLINICAL DATA:  Right upper quadrant pain. EXAM: ULTRASOUND ABDOMEN LIMITED RIGHT UPPER QUADRANT COMPARISON:  Abdominal ultrasound 11/25/2017 FINDINGS: Gallbladder: Physiologically distended with intraluminal sludge. No gallstones or wall thickening visualized. No sonographic Murphy sign noted by sonographer. Common bile duct: Diameter: 3-4 mm, normal. Liver: No focal lesion identified. Within normal limits in parenchymal echogenicity. Portal vein is patent on color Doppler imaging with normal direction of blood flow towards the liver.  Other: No right upper quadrant ascites. IMPRESSION: 1. Small amount of gallbladder sludge. No gallstones or sonographic findings of acute cholecystitis. 2. No biliary dilatation. Unremarkable sonographic appearance of the liver. Electronically Signed  By: Narda Rutherford M.D.   On: 10/06/2020 20:09    Procedures Procedures   Medications Ordered in ED Medications - No data to display  ED Course  I have reviewed the triage vital signs and the nursing notes.  Pertinent labs & imaging results that were available during my care of the patient were reviewed by me and considered in my medical decision making (see chart for details).    MDM Rules/Calculators/A&P                          Abdominal ultrasound shows some gallbladder sludge.  Negative for cholecystitis.  Patient afebrile here.  Will hold off on doing labs.  Has appointment to see general surgery but had to cancel it and she will reschedule.  Will place on PPI and Carafate Final Clinical Impression(s) / ED Diagnoses Final diagnoses:  None    Rx / DC Orders ED Discharge Orders     None        Lorre Nick, MD 10/06/20 2227

## 2020-10-06 NOTE — Discharge Instructions (Addendum)
Follow-up with the surgeon as we discussed

## 2021-03-07 ENCOUNTER — Other Ambulatory Visit: Payer: Self-pay | Admitting: *Deleted

## 2021-03-07 DIAGNOSIS — J4520 Mild intermittent asthma, uncomplicated: Secondary | ICD-10-CM

## 2021-03-07 MED ORDER — BUDESONIDE-FORMOTEROL FUMARATE 80-4.5 MCG/ACT IN AERO: 2.0000 | INHALATION_SPRAY | RESPIRATORY_TRACT | 1 refills | Status: DC | PRN

## 2021-03-17 ENCOUNTER — Other Ambulatory Visit: Payer: Self-pay | Admitting: Family Medicine

## 2021-03-17 DIAGNOSIS — J4520 Mild intermittent asthma, uncomplicated: Secondary | ICD-10-CM

## 2021-05-31 DIAGNOSIS — B3731 Acute candidiasis of vulva and vagina: Secondary | ICD-10-CM | POA: Diagnosis not present

## 2021-05-31 DIAGNOSIS — Z114 Encounter for screening for human immunodeficiency virus [HIV]: Secondary | ICD-10-CM | POA: Diagnosis not present

## 2021-05-31 DIAGNOSIS — Z113 Encounter for screening for infections with a predominantly sexual mode of transmission: Secondary | ICD-10-CM | POA: Diagnosis not present

## 2021-07-18 DIAGNOSIS — R109 Unspecified abdominal pain: Secondary | ICD-10-CM | POA: Diagnosis not present

## 2021-07-18 DIAGNOSIS — R1033 Periumbilical pain: Secondary | ICD-10-CM | POA: Diagnosis not present

## 2021-07-18 DIAGNOSIS — N898 Other specified noninflammatory disorders of vagina: Secondary | ICD-10-CM | POA: Diagnosis not present

## 2021-07-18 DIAGNOSIS — R1031 Right lower quadrant pain: Secondary | ICD-10-CM | POA: Diagnosis not present

## 2021-07-18 DIAGNOSIS — R1032 Left lower quadrant pain: Secondary | ICD-10-CM | POA: Diagnosis not present

## 2021-08-14 DIAGNOSIS — Z20822 Contact with and (suspected) exposure to covid-19: Secondary | ICD-10-CM | POA: Diagnosis not present

## 2021-08-14 DIAGNOSIS — J020 Streptococcal pharyngitis: Secondary | ICD-10-CM | POA: Diagnosis not present

## 2021-08-24 DIAGNOSIS — N898 Other specified noninflammatory disorders of vagina: Secondary | ICD-10-CM | POA: Diagnosis not present

## 2021-08-25 DIAGNOSIS — N898 Other specified noninflammatory disorders of vagina: Secondary | ICD-10-CM | POA: Diagnosis not present

## 2021-09-12 DIAGNOSIS — I10 Essential (primary) hypertension: Secondary | ICD-10-CM | POA: Diagnosis not present

## 2021-09-13 ENCOUNTER — Encounter (HOSPITAL_COMMUNITY): Payer: Self-pay | Admitting: Obstetrics & Gynecology

## 2021-09-13 ENCOUNTER — Inpatient Hospital Stay (HOSPITAL_COMMUNITY): Payer: BC Managed Care – PPO

## 2021-09-13 ENCOUNTER — Inpatient Hospital Stay (HOSPITAL_COMMUNITY)
Admission: AD | Admit: 2021-09-13 | Discharge: 2021-09-13 | Disposition: A | Discharge status: Home / Self Care | Payer: BC Managed Care – PPO | Attending: Obstetrics & Gynecology | Admitting: Obstetrics & Gynecology

## 2021-09-13 DIAGNOSIS — O3680X0 Pregnancy with inconclusive fetal viability, not applicable or unspecified: Secondary | ICD-10-CM | POA: Diagnosis not present

## 2021-09-13 DIAGNOSIS — Z3A01 Less than 8 weeks gestation of pregnancy: Secondary | ICD-10-CM | POA: Diagnosis not present

## 2021-09-13 DIAGNOSIS — O26893 Other specified pregnancy related conditions, third trimester: Secondary | ICD-10-CM

## 2021-09-13 DIAGNOSIS — R109 Unspecified abdominal pain: Secondary | ICD-10-CM | POA: Diagnosis not present

## 2021-09-13 DIAGNOSIS — O26891 Other specified pregnancy related conditions, first trimester: Secondary | ICD-10-CM | POA: Diagnosis not present

## 2021-09-13 DIAGNOSIS — M545 Low back pain, unspecified: Secondary | ICD-10-CM | POA: Insufficient documentation

## 2021-09-13 DIAGNOSIS — G8929 Other chronic pain: Secondary | ICD-10-CM | POA: Insufficient documentation

## 2021-09-13 DIAGNOSIS — N858 Other specified noninflammatory disorders of uterus: Secondary | ICD-10-CM | POA: Diagnosis not present

## 2021-09-13 LAB — CBC WITH DIFFERENTIAL/PLATELET
Abs Immature Granulocytes: 0.03 10*3/uL (ref 0.00–0.07)
Basophils Absolute: 0 10*3/uL (ref 0.0–0.1)
Basophils Relative: 0 %
Eosinophils Absolute: 0 10*3/uL (ref 0.0–0.5)
Eosinophils Relative: 0 %
HCT: 40 % (ref 36.0–46.0)
Hemoglobin: 13.7 g/dL (ref 12.0–15.0)
Immature Granulocytes: 0 %
Lymphocytes Relative: 24 %
Lymphs Abs: 2.4 10*3/uL (ref 0.7–4.0)
MCH: 30 pg (ref 26.0–34.0)
MCHC: 34.3 g/dL (ref 30.0–36.0)
MCV: 87.5 fL (ref 80.0–100.0)
Monocytes Absolute: 0.6 10*3/uL (ref 0.1–1.0)
Monocytes Relative: 6 %
Neutro Abs: 6.8 10*3/uL (ref 1.7–7.7)
Neutrophils Relative %: 70 %
Platelets: 193 10*3/uL (ref 150–400)
RBC: 4.57 MIL/uL (ref 3.87–5.11)
RDW: 12.6 % (ref 11.5–15.5)
WBC: 9.8 10*3/uL (ref 4.0–10.5)
nRBC: 0 % (ref 0.0–0.2)

## 2021-09-13 LAB — COMPREHENSIVE METABOLIC PANEL
ALT: 21 U/L (ref 0–44)
AST: 22 U/L (ref 15–41)
Albumin: 3.9 g/dL (ref 3.5–5.0)
Alkaline Phosphatase: 39 U/L (ref 38–126)
Anion gap: 6 (ref 5–15)
BUN: 12 mg/dL (ref 6–20)
CO2: 26 mmol/L (ref 22–32)
Calcium: 9.1 mg/dL (ref 8.9–10.3)
Chloride: 108 mmol/L (ref 98–111)
Creatinine, Ser: 0.89 mg/dL (ref 0.44–1.00)
GFR, Estimated: >60 mL/min (ref >60)
Glucose, Bld: 92 mg/dL (ref 70–99)
Potassium: 3.9 mmol/L (ref 3.5–5.1)
Sodium: 140 mmol/L (ref 135–145)
Total Bilirubin: 0.6 mg/dL (ref 0.3–1.2)
Total Protein: 6.9 g/dL (ref 6.5–8.1)

## 2021-09-13 LAB — URINALYSIS, ROUTINE W REFLEX MICROSCOPIC
Bacteria, UA: NONE SEEN
Bilirubin Urine: NEGATIVE
Glucose, UA: NEGATIVE mg/dL
Hgb urine dipstick: NEGATIVE
Ketones, ur: NEGATIVE mg/dL
Nitrite: NEGATIVE
Protein, ur: NEGATIVE mg/dL
Specific Gravity, Urine: 1.025 (ref 1.005–1.030)
pH: 5 (ref 5.0–8.0)

## 2021-09-13 LAB — HCG, QUANTITATIVE, PREGNANCY: hCG, Beta Chain, Quant, S: 421 m[IU]/mL — ABNORMAL HIGH (ref <5)

## 2021-09-13 LAB — POCT PREGNANCY, URINE: Preg Test, Ur: POSITIVE — AB

## 2021-09-13 NOTE — MAU Note (Signed)
.  Bailey Carney is a 31 y.o. at [redacted]w[redacted]d here in MAU reporting: has been having lower back and abdominal cramping since Saturday. She states she missed her period this month and had two +HPT this morning. Denies vb or abnormal discharge.   Pain score: back-6 Abdomen- 3 Vitals:   09/13/21 1339  Pulse: 91  Resp: 16  Temp: 98.4 F (36.9 C)  SpO2: 100%      Lab orders placed from triage:  UPT, UA

## 2021-09-13 NOTE — MAU Provider Note (Signed)
History     CSN: [id number]  Arrival date and time: 09/13/21 1326   Event Date/Time   First Provider Initiated Contact with Patient 09/13/21 1409      Chief Complaint  Patient presents with   Back Pain   Abdominal Pain   HPI  Bailey Carney is a 31 y.o. female G2P1001 @ [redacted]w[redacted]d here with lower back pain; she has a history of chronic back pain. The pain seems to have gotten worse since she found out she was pregnant. The back pain comes and goes, and at times it is constant. The pain is all across her lower back.  She has not taken anything for the pain. She rates her back pain 7/10. She reports her lower back pain 3/10; similar to a period cramp. No urinary complaints, no fever.   OB History     Gravida  2   Para  1   Term  1   Preterm  0   AB  0   Living  1      SAB  0   IAB  0   Ectopic  0   Multiple  0   Live Births  1           Past Medical History:  Diagnosis Date   Asthma    does not use inhaler often   Chlamydia    Cholecystitis    Chronic hypertension during pregnancy, antepartum 09/06/2017    Aspirin 81 mg daily after 12 weeks Current antihypertensives:  Labetalol   Baseline and surveillance labs (pulled in from [facility], refresh links as needed)  Lab Results Component Value Date  PLT 195 10/11/2017  CREATININE 0.62 10/11/2017  AST 40 10/11/2017  ALT 64 (H) 10/11/2017   Antenatal Testing CHTN - O10.919  Group I  BP < 140/90, no preeclampsia, AGA,  nml AFV, +/- meds    Group II BP > 140   Genital herpes    Hypertension    on meds    Past Surgical History:  Procedure Laterality Date   NO PAST SURGERIES      Family History  Problem Relation Age of Onset   Hypertension Mother    Breast cancer Maternal Grandmother    Hypertension Maternal Grandmother    Cancer Maternal Grandmother    Breast cancer Paternal Grandmother    Cancer Paternal Grandmother    Cancer Maternal Grandfather     Social History   Tobacco Use   Smoking status:  Never   Smokeless tobacco: Never  Vaping Use   Vaping Use: Never used  Substance Use Topics   Alcohol use: Not Currently    Comment: socially   Drug use: No    Allergies:  Allergies  Allergen Reactions   Amoxicillin Rash and Other (See Comments)    Has patient had a PCN reaction causing immediate rash, facial/tongue/throat swelling, SOB or lightheadedness with hypotension: No Has patient had a PCN reaction causing severe rash involving mucus membranes or skin necrosis: No Has patient had a PCN reaction that required hospitalization No Has patient had a PCN reaction occurring within the last 10 years: No If all of the above answers are "NO", then may proceed with Cephalosporin use.     Medications Prior to Admission  Medication Sig Dispense Refill Last Dose   amLODipine (NORVASC) 10 MG tablet Take 1 tablet by mouth daily 90 tablet 2 09/13/2021   albuterol (PROVENTIL HFA;VENTOLIN HFA) 108 (90 Base) MCG/ACT inhaler Inhale 1-2 puffs into  the lungs every 6 (six) hours as needed for wheezing or shortness of breath. 1 Inhaler 0 Unknown   ipratropium (ATROVENT) 0.03 % nasal spray Place into the nose.      naproxen (NAPROSYN) 500 MG tablet Take 1 tablet (500 mg total) by mouth 2 (two) times daily as needed for moderate pain. 15 tablet 0    pantoprazole (PROTONIX) 20 MG tablet Take 1 tablet (20 mg total) by mouth daily. 30 tablet 0    sertraline (ZOLOFT) 50 MG tablet Take 1 tablet by mouth once daily 30 tablet 0    sucralfate (CARAFATE) 1 g tablet Take 1 tablet (1 g total) by mouth 4 (four) times daily. 30 tablet 0    SYMBICORT 80-4.5 MCG/ACT inhaler Inhale 2 puffs into the lungs daily. 11 g 1    US OB LESS THAN 14 WEEKS WITH OB TRANSVAGINAL  Result Date: 09/13/2021 CLINICAL DATA:  Abdominal cramping since Saturday EXAM: OBSTETRIC <14 WK [place] AND TRANSVAGINAL OB US TECHNIQUE: Both transabdominal and transvaginal ultrasound examinations were performed for complete evaluation of the gestation as  well as the maternal uterus, adnexal regions, and pelvic cul-de-sac. Transvaginal technique was performed to assess early pregnancy. COMPARISON:  None Available. FINDINGS: Intrauterine gestational sac: Small cystic area in the fundal region of the endometrium measuring 3 mm, which could reflect a gestational sac. Yolk sac:  Not Visualized. Embryo:  Not Visualized. Cardiac Activity: Not Visualized. MSD: 3.0 mm, 5 weeks 0 days Maternal uterus/adnexae: Left ovary measures 2.0 x 3.1 x 2.2 cm in the right ovary measures 2.6 x 2.2 x 3.7 cm. Likely corpus luteum cyst within the right ovary measuring up to 2.0 cm. No imaging characteristics to suggest ectopic pregnancy at this time. Trace pelvic free fluid is likely physiologic. IMPRESSION: 1. Possible early intrauterine gestational sac, but no yolk sac, fetal pole, or cardiac activity yet visualized. Recommend follow-up quantitative B-HCG levels and follow-up US in 14 days to assess viability. This recommendation follows SRU consensus guidelines: Diagnostic Criteria for Nonviable Pregnancy Early in the First Trimester. Malva Limes Med 2013; 242:3536-14. 2. Probable right ovarian corpus luteum cyst. Electronically Signed   By: Sharlet Salina M.D.   On: 09/13/2021 15:16     Results for orders placed or performed during the hospital encounter of 09/13/21 (from the past 48 hour(s))  Pregnancy, urine POC     Status: Abnormal   Collection Time: 09/13/21  1:39 PM  Result Value Ref Range   Preg Test, Ur POSITIVE (A) NEGATIVE    Comment:        THE SENSITIVITY OF THIS METHODOLOGY IS >24 mIU/mL   Urinalysis, Routine w reflex microscopic Urine, Clean Catch     Status: Abnormal   Collection Time: 09/13/21  1:41 PM  Result Value Ref Range   Color, Urine YELLOW YELLOW   APPearance HAZY (A) CLEAR   Specific Gravity, Urine 1.025 1.005 - 1.030   pH 5.0 5.0 - 8.0   Glucose, UA NEGATIVE NEGATIVE mg/dL   Hgb urine dipstick NEGATIVE NEGATIVE   Bilirubin Urine NEGATIVE NEGATIVE    Ketones, ur NEGATIVE NEGATIVE mg/dL   Protein, ur NEGATIVE NEGATIVE mg/dL   Nitrite NEGATIVE NEGATIVE   Leukocytes,Ua SMALL (A) NEGATIVE   RBC / HPF 0-5 0 - 5 RBC/hpf   WBC, UA 0-5 0 - 5 WBC/hpf   Bacteria, UA NONE SEEN NONE SEEN   Squamous Epithelial / LPF 6-10 0 - 5   Mucus PRESENT     Comment: Performed  at [facility], [address] [place], [place] [address]  CBC with Differential/Platelet     Status: None   Collection Time: 09/13/21  2:18 PM  Result Value Ref Range   WBC 9.8 4.0 - 10.5 K/uL   RBC 4.57 3.87 - 5.11 MIL/uL   Hemoglobin 13.7 12.0 - 15.0 g/dL   HCT 60.440.0 54.036.0 - 98.146.0 %   MCV 87.5 80.0 - 100.0 fL   MCH 30.0 26.0 - 34.0 pg   MCHC 34.3 30.0 - 36.0 g/dL   RDW 19.112.6 47.811.5 - 29.515.5 %   Platelets 193 150 - 400 K/uL   nRBC 0.0 0.0 - 0.2 %   Neutrophils Relative % 70 %   Neutro Abs 6.8 1.7 - 7.7 K/uL   Lymphocytes Relative 24 %   Lymphs Abs 2.4 0.7 - 4.0 K/uL   Monocytes Relative 6 %   Monocytes Absolute 0.6 0.1 - 1.0 K/uL   Eosinophils Relative 0 %   Eosinophils Absolute 0.0 0.0 - 0.5 K/uL   Basophils Relative 0 %   Basophils Absolute 0.0 0.0 - 0.1 K/uL   Immature Granulocytes 0 %   Abs Immature Granulocytes 0.03 0.00 - 0.07 K/uL    Comment: Performed at [facility], [address] [place], [place] [address]  Comprehensive metabolic panel     Status: None   Collection Time: 09/13/21  2:18 PM  Result Value Ref Range   Sodium 140 135 - 145 mmol/L   Potassium 3.9 3.5 - 5.1 mmol/L   Chloride 108 98 - 111 mmol/L   CO2 26 22 - 32 mmol/L   Glucose, Bld 92 70 - 99 mg/dL    Comment: Glucose reference range applies only to samples taken after fasting for at least 8 hours.   BUN 12 6 - 20 mg/dL   Creatinine, Ser 8.650.89 0.44 - 1.00 mg/dL   Calcium 9.1 8.9 - 78.410.3 mg/dL   Total Protein 6.9 6.5 - 8.1 g/dL   Albumin 3.9 3.5 - 5.0 g/dL   AST 22 15 - 41 U/L   ALT 21 0 - 44 U/L   Alkaline Phosphatase 39 38 - 126 U/L   Total Bilirubin 0.6 0.3 - 1.2 mg/dL   GFR,  Estimated >69>60 >62>60 mL/min    Comment: (NOTE) Calculated using the CKD-EPI Creatinine Equation (2021)    Anion gap 6 5 - 15    Comment: Performed at [facility], [address] [place], [place] [address]  ABO/Rh     Status: None   Collection Time: 09/13/21  2:18 PM  Result Value Ref Range   ABO/RH(D)      B POS Performed at [facility], [address] [place], [place] [address]   hCG, quantitative, pregnancy     Status: Abnormal   Collection Time: 09/13/21  2:18 PM  Result Value Ref Range   hCG, Beta Chain, Quant, S 421 (H) <5 mIU/mL    Comment:          GEST. AGE      CONC.  (mIU/mL)   <=1 WEEK        5 - 50     2 WEEKS       50 - 500     3 WEEKS       100 - 10,000     4 WEEKS     1,000 - 30,000     5 WEEKS     3,500 -  115,000   6-8 WEEKS     12,000 - 270,000    12 WEEKS     15,000 - 220,000        FEMALE AND NON-PREGNANT FEMALE:     LESS THAN 5 mIU/mL Performed at [facility], [address] [place], [place] [address]     Review of Systems  Constitutional:  Negative for fever.  Genitourinary:  Negative for dysuria.  Physical Exam   Blood pressure (!) 147/87, pulse 91, temperature 98.4 F (36.9 C), temperature source Oral, resp. rate 16, height 5\' 7"  (1.702 m), weight 110.5 kg, last menstrual period 07/31/2021, SpO2 100 %.  Physical Exam Constitutional:      General: She is not in acute distress.    Appearance: She is well-developed. She is not ill-appearing, toxic-appearing or diaphoretic.  Abdominal:     Tenderness: There is no abdominal tenderness. There is no right CVA tenderness or left CVA tenderness.  Genitourinary:    Uterus: Normal.   Skin:    General: Skin is warm.  Neurological:     Mental Status: She is alert.   MAU Course  Procedures None  MDM  Wet prep & GC- declined, had recent negative STI testing.  HIV, CBC, Hcg, ABO 08/02/2021 OB transvaginal    Assessment and Plan   A:  1. Pregnancy of unknown anatomic location   2.  Abdominal pain during pregnancy in third trimester      P:  DC home Pelvic rest Return to MAU on Friday for repeat Hcg level Return to MAU if symptoms worsen  Lorraina Spring, Tuesday, NP 09/13/2021 6:47 PM

## 2021-09-14 LAB — CULTURE, OB URINE: Culture: 30000 — AB

## 2021-09-14 LAB — ABO/RH: ABO/RH(D): B POS

## 2021-09-15 ENCOUNTER — Other Ambulatory Visit (HOSPITAL_COMMUNITY)
Admit: 2021-09-15 | Discharge: 2021-09-15 | Disposition: A | Discharge status: Home / Self Care | Payer: BC Managed Care – PPO | Attending: Obstetrics and Gynecology | Admitting: Obstetrics and Gynecology

## 2021-09-15 ENCOUNTER — Inpatient Hospital Stay (HOSPITAL_COMMUNITY)
Admission: AD | Admit: 2021-09-15 | Discharge: 2021-09-15 | Disposition: A | Discharge status: Home / Self Care | Payer: BC Managed Care – PPO | Attending: Obstetrics and Gynecology | Admitting: Obstetrics and Gynecology

## 2021-09-15 ENCOUNTER — Other Ambulatory Visit: Payer: Self-pay | Admitting: Advanced Practice Midwife

## 2021-09-15 ENCOUNTER — Other Ambulatory Visit: Payer: Self-pay

## 2021-09-15 DIAGNOSIS — O3680X0 Pregnancy with inconclusive fetal viability, not applicable or unspecified: Secondary | ICD-10-CM | POA: Insufficient documentation

## 2021-09-15 DIAGNOSIS — R1030 Lower abdominal pain, unspecified: Secondary | ICD-10-CM | POA: Insufficient documentation

## 2021-09-15 DIAGNOSIS — Z3A00 Weeks of gestation of pregnancy not specified: Secondary | ICD-10-CM | POA: Insufficient documentation

## 2021-09-15 LAB — HCG, QUANTITATIVE, PREGNANCY: hCG, Beta Chain, Quant, S: 218 m[IU]/mL — ABNORMAL HIGH (ref <5)

## 2021-09-15 NOTE — MAU Note (Signed)
.  Bailey Carney is a 31 y.o. at [redacted]w[redacted]d here in MAU reporting: needs repeat hcg level. States she has been having intermittent lower abdominal cramping, but it is better than it was when she was seen the other day. Denies VB or abnormal discharge.   Pain score: 2 Vitals:   09/15/21 1805  BP: 140/86  Pulse: 85  Resp: 18  Temp: 98.8 F (37.1 C)  SpO2: 98%      Lab orders placed from triage:  hcg

## 2021-09-15 NOTE — MAU Provider Note (Signed)
Event Date/Time  First Provider Initiated Contact with Patient 09/15/21 1809    S [name] is a 31 y.o. G2P1001 patient who presents to MAU today for repeat quant hCG. She continues to experience lower abdominal pain, but states it is "better than before". Pain score 2/10. She denies vaginal bleeding.   O BP 140/86 (BP Location: Right Arm)   Pulse 85   Temp 98.8 F (37.1 C) (Oral)   Resp 18   Ht 5\' 7"  (1.702 m)   Wt 109.7 kg   LMP 07/31/2021   SpO2 98%   BMI 37.89 kg/m    Physical Exam Vitals and nursing note reviewed.  Constitutional:      General: She is not in acute distress.    Appearance: Normal appearance. She is not ill-appearing.  Cardiovascular:     Rate and Rhythm: Normal rate.  Pulmonary:     Effort: Pulmonary effort is normal.  Neurological:     Mental Status: She is alert.    A Medical screening exam complete Patient requests discharge home, call with results due to child care conflict Inappropriate decrease in Quant hCG  Component     Latest Ref Rng 09/13/2021 09/15/2021  HCG, Beta Chain, Quant, S     <5 mIU/mL 421 (H)  218 (H)      P Discharge from MAU in stable condition with ectopic precautions Warning signs for worsening condition that would warrant emergency follow-up discussed Patient may return to MAU as needed   F/U and Telephone Encounter: --Patient called by CNM at 1815. Identity confirmed x 2. Reviewed decreasing quant, concerning for nonviable pregnancy, cannot rule out ectopic. Explained that this is an indication for repeat stat Quant hCG in 48 hours. Can schedule for Sunday night in MAU or Monday morning in office. Patient states she has to work Monday, prefers Sunday night. Order placed accordingly . Signs of increasing acuity reviewed. Underscored that these would be indications for immediate return to MAU. Patient denies questions or concerns at end of call.   Wednesday, MSA, MSN, CNM

## 2021-09-16 ENCOUNTER — Telehealth: Payer: Self-pay

## 2021-09-16 NOTE — Telephone Encounter (Signed)
Patient called with questions regarding results from yesterday and symptoms today. CNM reiterated evidence SAB and reviewed warning signs for pain and bleeding. Patient verbalized understanding of results and when to return to MAU.  Rolm Bookbinder, CNM 09/16/21 11:18 AM

## 2021-09-17 ENCOUNTER — Other Ambulatory Visit (HOSPITAL_COMMUNITY): Payer: BC Managed Care – PPO | Attending: Obstetrics & Gynecology

## 2021-09-17 ENCOUNTER — Encounter (HOSPITAL_COMMUNITY): Payer: Self-pay | Admitting: Obstetrics & Gynecology

## 2021-09-17 ENCOUNTER — Other Ambulatory Visit: Payer: Self-pay

## 2021-09-17 ENCOUNTER — Inpatient Hospital Stay (HOSPITAL_COMMUNITY)
Admission: AD | Admit: 2021-09-17 | Discharge: 2021-09-17 | Disposition: A | Discharge status: Home / Self Care | Payer: BC Managed Care – PPO | Attending: Obstetrics & Gynecology | Admitting: Obstetrics & Gynecology

## 2021-09-17 DIAGNOSIS — O039 Complete or unspecified spontaneous abortion without complication: Secondary | ICD-10-CM | POA: Diagnosis not present

## 2021-09-17 DIAGNOSIS — Z3A01 Less than 8 weeks gestation of pregnancy: Secondary | ICD-10-CM

## 2021-09-17 LAB — HCG, QUANTITATIVE, PREGNANCY: hCG, Beta Chain, Quant, S: 59 m[IU]/mL — ABNORMAL HIGH (ref <5)

## 2021-09-17 NOTE — MAU Note (Signed)
Pt left once being seen by NP after lab drawn. Pt to remain on MAU census to follow results.

## 2021-09-17 NOTE — MAU Provider Note (Signed)
History   Chief Complaint:  Follow-up   Bailey Carney is  31 y.o. G2P1001 Patient's last menstrual period was 07/31/2021.Bailey Carney Patient is here for follow up of quantitative HCG and ongoing surveillance of pregnancy status. She is 102w6d weeks gestation  by LMP.    Since her last visit, the patient is without new complaint. The patient reports bleeding as  similar to period.  She continue to have abdominal cramping.    Her previous Quantitative HCG values are:  Component     Latest Ref Rng 09/13/2021 09/15/2021  HCG, Beta Chain, Quant, S     <5 mIU/mL 421 (H)  218 (H)        Physical Exam   Blood pressure (!) 145/94, pulse 70, temperature 99.3 F (37.4 C), temperature source Oral, resp. rate 16, last menstrual period 07/31/2021, SpO2 100 %.  Physical Examination: General appearance - alert, well appearing, and in no distress Mental status - normal mood, behavior, speech, dress, motor activity, and thought processes Eyes - sclera anicteric Chest - normal respiratory effort  Labs: Results for orders placed or performed during the hospital encounter of 09/17/21 (from the past 24 hour(s))  hCG, quantitative, pregnancy   Collection Time: 09/17/21  4:26 PM  Result Value Ref Range   hCG, Beta Chain, Quant, S 59 (H) <5 mIU/mL    Ultrasound Studies:   No results found.  Assessment:   1. Miscarriage   2. [redacted] weeks gestation of pregnancy     -HCG has dropped to 50 consistent with failed pregnancy. Discussed results with patient. She is RH positive. Will have f/u at [facility]  Plan: -Discharge home in stable condition -bleeding precautions discussed -Patient advised to follow-up with [facility] - message sent to office for non stat HCG & provider appointment -Patient may return to MAU as needed or if her condition were to change or worsen  Bailey Horn, NP 09/17/2021, 6:12 PM

## 2021-09-17 NOTE — MAU Note (Signed)
Bailey Carney is a 31 y.o. at [redacted]w[redacted]d here in MAU reporting: here for follow up hcg. Ongoing pain and bleeding. States bleeding is heavier then a period and has changed 3 pads today.  Onset of complaint: ongoing  Pain score: 6/10  Vitals:   09/17/21 1626  BP: (!) 145/94  Pulse: 70  Resp: 16  Temp: 99.3 F (37.4 C)  SpO2: 100%     Lab orders placed from triage: hcg

## 2021-09-20 ENCOUNTER — Other Ambulatory Visit: Payer: Self-pay | Admitting: *Deleted

## 2021-09-20 DIAGNOSIS — O039 Complete or unspecified spontaneous abortion without complication: Secondary | ICD-10-CM

## 2021-09-25 ENCOUNTER — Other Ambulatory Visit: Payer: BC Managed Care – PPO

## 2021-09-25 ENCOUNTER — Other Ambulatory Visit: Payer: Self-pay

## 2021-09-25 DIAGNOSIS — O039 Complete or unspecified spontaneous abortion without complication: Secondary | ICD-10-CM

## 2021-09-26 LAB — BETA HCG QUANT (REF LAB): hCG Quant: 2 m[IU]/mL

## 2021-10-11 ENCOUNTER — Encounter: Payer: Self-pay | Admitting: Certified Nurse Midwife

## 2021-10-11 ENCOUNTER — Other Ambulatory Visit: Payer: Self-pay

## 2021-10-11 ENCOUNTER — Ambulatory Visit (INDEPENDENT_AMBULATORY_CARE_PROVIDER_SITE_OTHER): Payer: BC Managed Care – PPO | Admitting: Certified Nurse Midwife

## 2021-10-11 ENCOUNTER — Other Ambulatory Visit (HOSPITAL_COMMUNITY)
Admission: RE | Admit: 2021-10-11 | Discharge: 2021-10-11 | Disposition: A | Discharge status: Home / Self Care | Payer: BC Managed Care – PPO | Source: Ambulatory Visit | Attending: Certified Nurse Midwife | Admitting: Certified Nurse Midwife

## 2021-10-11 VITALS — BP 117/85 | HR 82 | Wt 248.0 lb

## 2021-10-11 DIAGNOSIS — N898 Other specified noninflammatory disorders of vagina: Secondary | ICD-10-CM | POA: Diagnosis not present

## 2021-10-11 DIAGNOSIS — B3731 Acute candidiasis of vulva and vagina: Secondary | ICD-10-CM

## 2021-10-11 DIAGNOSIS — O039 Complete or unspecified spontaneous abortion without complication: Secondary | ICD-10-CM

## 2021-10-11 DIAGNOSIS — Z3009 Encounter for other general counseling and advice on contraception: Secondary | ICD-10-CM

## 2021-10-11 MED ORDER — SLYND 4 MG PO TABS: 1.0000 | ORAL_TABLET | Freq: Every day | ORAL | 11 refills | Status: DC

## 2021-10-12 LAB — CERVICOVAGINAL ANCILLARY ONLY
Bacterial Vaginitis (gardnerella): NEGATIVE
Candida Glabrata: NEGATIVE
Candida Vaginitis: POSITIVE — AB
Comment: NEGATIVE
Comment: NEGATIVE
Comment: NEGATIVE
Comment: NEGATIVE
Trichomonas: NEGATIVE

## 2021-10-13 ENCOUNTER — Telehealth: Payer: Self-pay | Admitting: *Deleted

## 2021-10-13 ENCOUNTER — Telehealth: Payer: BC Managed Care – PPO | Admitting: Family Medicine

## 2021-10-13 ENCOUNTER — Encounter: Payer: Self-pay | Admitting: Certified Nurse Midwife

## 2021-10-13 DIAGNOSIS — B3731 Acute candidiasis of vulva and vagina: Secondary | ICD-10-CM

## 2021-10-13 MED ORDER — FLUCONAZOLE 150 MG PO TABS: 150.0000 mg | ORAL_TABLET | Freq: Once | ORAL | 0 refills | Status: AC

## 2021-10-13 NOTE — Patient Instructions (Signed)

## 2021-10-13 NOTE — Progress Notes (Signed)
Virtual Visit Consent   Bailey Carney, you are scheduled for a virtual visit with a [facility] provider today. Just as with appointments in the office, your consent must be obtained to participate. Your consent will be active for this visit and any virtual visit you may have with one of our providers in the next 365 days. If you have a MyChart account, a copy of this consent can be sent to you electronically.  As this is a virtual visit, video technology does not allow for your provider to perform a traditional examination. This may limit your provider's ability to fully assess your condition. If your provider identifies any concerns that need to be evaluated in person or the need to arrange testing (such as labs, EKG, etc.), we will make arrangements to do so. Although advances in technology are sophisticated, we cannot ensure that it will always work on either your end or our end. If the connection with a video visit is poor, the visit may have to be switched to a telephone visit. With either a video or telephone visit, we are not always able to ensure that we have a secure connection.  By engaging in this virtual visit, you consent to the provision of healthcare and authorize for your insurance to be billed (if applicable) for the services provided during this visit. Depending on your insurance coverage, you may receive a charge related to this service.  I need to obtain your verbal consent now. Are you willing to proceed with your visit today? Bailey Carney has provided verbal consent on 10/13/2021 for a virtual visit (video or telephone). Freddy Finner, NP  Date: 10/13/2021 2:18 PM  Virtual Visit via Video Note   I, Freddy Finner, connected with  Bailey Carney  ([id number], 14-Jun-1990) on 10/13/21 at  2:15 PM EDT by a video-enabled telemedicine application and verified that I am speaking with the correct person using two identifiers.  Location: Patient: Virtual Visit Location Patient:  Home Provider: Virtual Visit Location Provider: Home Office   I discussed the limitations of evaluation and management by telemedicine and the availability of in person appointments. The patient expressed understanding and agreed to proceed.    History of Present Illness: Bailey Carney is a 31 y.o. who identifies as a female who was assigned female at birth, and is being seen today for vaginal discomfort. Recent office appt- with vaginal swab demonstrated yeast- desires treatment today. Office has closed so made virtual appt. Reports pain while having intercourse earlier this week. It was unilateral on right, but has eased.   HPI: Vaginal Discharge The patient's primary symptoms include pelvic pain and vaginal discharge. This is a new problem. The current episode started in the past 7 days. The problem occurs constantly. The problem has been gradually worsening. The pain is moderate. The problem affects the right side. Associated symptoms include abdominal pain and painful intercourse. Pertinent negatives include no anorexia, back pain, chills, constipation, diarrhea, discolored urine, dysuria, fever, flank pain, frequency, headaches, hematuria, joint pain, joint swelling, nausea, rash, sore throat, urgency or vomiting. The vaginal discharge was white. There has been no bleeding. She has not been passing clots. She has not been passing tissue. The symptoms are aggravated by intercourse. She has tried acetaminophen for the symptoms. The treatment provided mild relief. She is sexually active. No, her partner does not have an STD. She uses nothing for contraception. Her menstrual history has been regular. Her past medical history is significant  for miscarriage, an STD and vaginosis. There is no history of an abdominal surgery, a Cesarean section, an ectopic pregnancy, endometriosis, a gynecological surgery, herpes simplex, ovarian cysts or a terminated pregnancy.    Problems:  Patient Active Problem List    Diagnosis Date Noted   Paresthesias 08/26/2019   Asthma 05/21/2019   Hypertension 05/21/2019   Postpartum depression 04/29/2019    Allergies:  Allergies  Allergen Reactions   Amoxicillin Rash and Other (See Comments)    Has patient had a PCN reaction causing immediate rash, facial/tongue/throat swelling, SOB or lightheadedness with hypotension: No Has patient had a PCN reaction causing severe rash involving mucus membranes or skin necrosis: No Has patient had a PCN reaction that required hospitalization No Has patient had a PCN reaction occurring within the last 10 years: No If all of the above answers are "NO", then may proceed with Cephalosporin use.    Medications:  Current Outpatient Medications:    albuterol (PROVENTIL HFA;VENTOLIN HFA) 108 (90 Base) MCG/ACT inhaler, Inhale 1-2 puffs into the lungs every 6 (six) hours as needed for wheezing or shortness of breath., Disp: 1 Inhaler, Rfl: 0   amLODipine (NORVASC) 10 MG tablet, Take by mouth., Disp: , Rfl:    Drospirenone (SLYND) 4 MG TABS, Take 1 tablet by mouth daily., Disp: 28 tablet, Rfl: 11  Observations/Objective: Patient is well-developed, well-nourished in no acute distress.  Resting comfortably  at home.  Head is normocephalic, atraumatic.  No labored breathing.  Speech is clear and coherent with logical content.  Patient is alert and oriented at baseline.    Assessment and Plan:  1. Yeast infection of the vagina  - fluconazole (DIFLUCAN) 150 MG tablet; Take 1 tablet (150 mg total) by mouth once for 1 dose.  Dispense: 1 tablet; Refill: 0  -yeast present on recent vaginal swab at office this week on- 10/11/21; office is closed pt is uncomfortable and desires tx. -advised if unilateral pain reoccurs or pain with intercourse continues she needs to be seen in person   Reviewed side effects, risks and benefits of medication.    Patient acknowledged agreement and understanding of the plan.   Past Medical, Surgical,  Social History, Allergies, and Medications have been Reviewed.    Follow Up Instructions: I discussed the assessment and treatment plan with the patient. The patient was provided an opportunity to ask questions and all were answered. The patient agreed with the plan and demonstrated an understanding of the instructions.  A copy of instructions were sent to the patient via MyChart unless otherwise noted below.    The patient was advised to call back or seek an in-person evaluation if the symptoms worsen or if the condition fails to improve as anticipated.  Time:  I spent 10 minutes with the patient via telehealth technology discussing the above problems/concerns.    Freddy Finner, NP

## 2021-10-13 NOTE — Telephone Encounter (Signed)
Pt left VM message stating that she has questions about her lab test results.  She requests a call back.  7/3  Per chart review, pt sent a Mychart message with her question regarding yeast on vaginal swab. A response was sent by Marylynn Pearson, RNC stating that Rx has been sent to her pharmacy.

## 2021-10-14 MED ORDER — TERCONAZOLE 0.4 % VA CREA: 1.0000 | TOPICAL_CREAM | Freq: Every day | VAGINAL | 0 refills | Status: DC

## 2021-10-14 NOTE — Progress Notes (Signed)
History:  [name] is a 31 y.o. G2P1001 who presents to clinic today for follow up of SAB 4 weeks ago. Denies bleeding, cramping. Menses has not returned yet. Does not plan on becoming pregnant soon (but is having some mild cramping signaling her menses is likely to show up within the next few days).  The following portions of the patient's history were reviewed and updated as appropriate: allergies, current medications, family history, past medical history, social history, past surgical history and problem list.  ROS Pertinent items noted in HPI and remainder of comprehensive ROS otherwise negative.   Objective:  Physical Exam BP 117/85   Pulse 82   Wt 248 lb (112.5 kg)   LMP 07/31/2021   Breastfeeding No   BMI 38.84 kg/m  Constitutional: She is oriented to person, place, and time. She appears well-developed and well-nourished.  Cardiovascular: Normal rate.  Abdominal: Soft. She exhibits no distension.  Neurological: She is alert and oriented to person, place, and time.  Skin: Skin is warm and dry.  Psychiatric: She has a normal mood and affect. Her behavior is normal. Judgment and thought content normal.   Labs and Imaging No results found for this or any previous visit (from the past 24 hour(s)).  No results found.  Assessment & Plan:  1. Miscarriage - Doing well now, was not planning this pregnancy and desires to wait awhile before attempting again.  2. Vaginal irritation - Cervicovaginal ancillary only( [place])  3. Birth control counseling - Shared decision making used to review birth control methods in stepwise fashion, pt prefers pills - Drospirenone (SLYND) 4 MG TABS; Take 1 tablet by mouth daily.  Dispense: 28 tablet; Refill: 11  4. Yeast vaginitis - terconazole (TERAZOL 7) 0.4 % vaginal cream; Place 1 applicator vaginally at bedtime. Use for seven days  Dispense: 45 g; Refill: 0  Return PRN or for annual exam.   Bernerd Limbo,  CNM 10/14/2021 4:36 PM

## 2021-10-18 ENCOUNTER — Telehealth: Payer: Self-pay

## 2021-10-18 DIAGNOSIS — N946 Dysmenorrhea, unspecified: Secondary | ICD-10-CM

## 2021-10-18 DIAGNOSIS — R102 Pelvic and perineal pain: Secondary | ICD-10-CM

## 2021-10-18 MED ORDER — IBUPROFEN 600 MG PO TABS: 600.0000 mg | ORAL_TABLET | Freq: Four times a day (QID) | ORAL | 3 refills | Status: DC | PRN

## 2021-10-18 NOTE — Telephone Encounter (Signed)
Called patient stating I am returning her phone call regarding results. Patient states her question isn't about her results because she already got those from another provider on Friday. She was waiting to talk with someone about filing a formal complaint. Patient states she came in on Wednesday and had a swab done with results that came back the next day. Patient states she called on Friday for results and never heard back from someone so she went to her primary care provider for treatment. Patient states she feels that delay is unprofessional. She had also had issues in the past with waiting 2 hours just for bloodwork. She is also upset with how her miscarriage was handled at this hospital. Patient states she didn't feel like someone thoroughly explained things to her or what to expect and she didn't know because she has never had a miscarriage before. She just received a mychart message saying it was complete and that's it. Patient states she's had other issues in the past when she came to [place] with her son's pregnancy. She states she really liked the provider she saw last week and felt comfortable with her but also felt like she didn't do anything for the pelvic pain she's been having off/on since her miscarriage. Patient states she plans on following up with her PCP instead. Apologized to patient and stated that's certainly not the experience we would want her to have. Stated I am happy to schedule her a follow up appt with that provider but it sounds like she's not comfortable coming here. Patient was appreciative but declined. States she plans on following up with PCP and if they cannot figure out she will go to the [facility]. Discussed with patient they no longer see patients outside of pregnancy and she would be sent to general ER. Advised she could go to other Ob/gyn practices in the area and shouldn't need a referral. Patient verbalized understanding & requests call back from office manager. Told  patient I would send her a message now letting her know she would like a call back. Patient verbalized understanding.

## 2021-10-18 NOTE — Addendum Note (Signed)
Addended by: Edd Arbour on: 10/18/2021 05:55 PM   Modules accepted: Orders

## 2021-10-18 NOTE — Telephone Encounter (Signed)
Complete

## 2021-10-18 NOTE — Telephone Encounter (Signed)
Called to discuss care with Bailey Carney who expressed frustration with the lack of continuity she experienced through her miscarriage and the number of people contacting her. She is still having pelvic pain and is concerned that she will end up seeing yet another provider (which is why she has refused additional appointments up to this point).  Apologized for the lack of continuity and reviewed our last visit at which we decided her mild cramping was likely onset of menses. Her menses did begin within the last couple of days, her bleeding is moderate but manageable. Suggested ibuprofen for her cramping and offered pelvic floor physical therapy since she is having pain at the pubic symphysis. If pain does not resolve with these two interventions, we can pursue additional testing. Reassured her that she can stay on my schedule and if we need to involve an MD, I will give advance notice who she will be with and we can keep her to 1-2 providers. Pt expressed appreciation and agreed to plan of care.   Thanked her for allowing [place] to continue providing care for her.  Edd Arbour, CNM, MSN, IBCLC Certified Nurse Midwife, [facility]

## 2021-10-31 NOTE — Therapy (Unsigned)
OUTPATIENT PHYSICAL THERAPY FEMALE PELVIC EVALUATION   Patient Name: Bailey Carney MRN: [id number] DOB:26-Jan-1991, 30 y.o., female Today's Date: 11/01/2021   PT End of Session - 11/01/21 1057     Visit Number 1    Date for PT Re-Evaluation 01/24/22    Authorization Type [employer], [employer]    PT Start Time 1015    PT Stop Time 1100    PT Time Calculation (min) 45 min    Activity Tolerance Patient tolerated treatment well    Behavior During Therapy [facility] for tasks assessed/performed             Past Medical History:  Diagnosis Date   Asthma    does not use inhaler often   Chlamydia    Cholecystitis    Genital herpes    Hypertension    on meds   Past Surgical History:  Procedure Laterality Date   NO PAST SURGERIES     Patient Active Problem List   Diagnosis Date Noted   Paresthesias 08/26/2019   Asthma 05/21/2019   Hypertension 05/21/2019   Postpartum depression 04/29/2019    PCP: [facility] provider  REFERRING PROVIDER: Bernerd Limbo, CNM  REFERRING DIAG: R10.2 (ICD-10-CM) - Pelvic pain  THERAPY DIAG:  Cramp and spasm  Pelvic pain  Rationale for Evaluation and Treatment Rehabilitation  ONSET DATE: 08/2021  SUBJECTIVE:                                                                                                                                                                                           SUBJECTIVE STATEMENT: SAB 4 weeks ago;     PAIN:  Are you having pain? Yes NPRS scale: 5-10/10 Pain location: Right, pubic bone  Pain type: sharp  Pain description: intermittent   Aggravating factors: pain is random, walking for a period of time Relieving factors: not sure  PRECAUTIONS: None  WEIGHT BEARING RESTRICTIONS No  FALLS:  Has patient fallen in last 6 months? No  LIVING ENVIRONMENT: Lives with: lives with their family  OCCUPATION: works at home  PLOF: Independent  PATIENT GOALS reduce pain  PERTINENT HISTORY:   None   INTERCOURSE Pain with intercourse:  sore afterwards Ability to have vaginal penetration:  Yes:   Marinoff Scale: 1/3  PREGNANCY Vaginal deliveries 1  C-section deliveries 0 Currently pregnant No  OBJECTIVE:   DIAGNOSTIC FINDINGS:  None  PATIENT SURVEYS:  PFIQ-7 83; POPIQ-7 83  COGNITION:  Overall cognitive status: Within functional limits for tasks assessed     SENSATION:  Light touch: Appears intact  Proprioception: Appears intact  LUMBAR SPECIAL TESTS:  Trendelenburg sign: Positive  on left and right hip drops                POSTURE: No Significant postural limitations   PELVIC ALIGNMENT:  LUMBARAROM/PROM full lumbar ROM   LOWER EXTREMITY ROM:  Passive ROM Right eval Left eval  Hip external rotation 40 50   (Blank rows = not tested)  LOWER EXTREMITY MMT:  MMT Right eval Left eval  Hip abduction 3+/5 4+/5  Hip adduction 4/5 4+/5    PALPATION:   General  tenderness located suprapubically, medial side of bilateral ASIS, left side of pubic bone superiorly, right hip adductor, right obturator internist                External Perineal Exam tenderness along the right hip adductor, along the left ischiocavernosus                             Internal Pelvic Floor tenderness located on right side of bladder, restrictions of the anterior cervix, tenderness located on the right obturator internist, along the iliococycgeus  Patient confirms identification and approves PT to assess internal pelvic floor and treatment Yes  PELVIC MMT:   MMT eval  Vaginal 3/5 for 1 second at times she will bulge the pelvic floor  (Blank rows = not tested)        TONE: increased  PROLAPSE: None  TODAY'S TREATMENT  EVAL completed   PATIENT EDUCATION:  Education details: educated patient on the pelvic floor and how the muscles refer pain to the different areas Person educated: Patient Education method: Explanation Education comprehension: verbalized  understanding   HOME EXERCISE PROGRAM: None today  ASSESSMENT:  CLINICAL IMPRESSION: Patient is a 31 y.o. female who was seen today for physical therapy evaluation and treatment for pelvic pain. Patient reports she has had this pain since her first pregnancy. The pain return for the last pregnancy and continues after SVD. Intermittent right suprapubic pain is at level 5-10/10 with walking and can come on randomly. Decreased bilateral hip external rotation.  Weakness in bilateral hips for abduction. Pelvic floor strength is 3/5 for 1 second and at times she may bulge the pelvic floor. She has tenderness located on the right obturator internist, iliococcygeus, right side of the bladder and restrictions on the anterior cervix. Tenderness located on the right hip adductor, bilateral psoas, and along the left ischiocavernosus. PFIQ-7 83 and POPIQ-783 scores. Patient will benefit from skilled therapy to reduce her pain and improve quality of life.    OBJECTIVE IMPAIRMENTS decreased activity tolerance, decreased endurance, decreased ROM, decreased strength, increased fascial restrictions, increased muscle spasms, and pain.   ACTIVITY LIMITATIONS locomotion level  PARTICIPATION LIMITATIONS: community activity  PERSONAL FACTORS  none  are also affecting patient's functional outcome.   REHAB POTENTIAL: Excellent  CLINICAL DECISION MAKING: Stable/uncomplicated  EVALUATION COMPLEXITY: Low   GOALS: Goals reviewed with patient? Yes  SHORT TERM GOALS: Target date: 11/29/2021  Patient independent with her stretches to elongate the muscles.  Baseline:Not educated yet Goal status: INITIAL  LONG TERM GOALS: Target date: 01/24/2022   Patient independent with advanced HEP to increase core and hip strength.  Baseline: Not educated yet Goal status: INITIAL  2.  Patient is able to walk without pain increasing </= 1/10 due to reduction of trigger points in the pelvic floor.  Baseline: pain level  5-10/10 Goal status: INITIAL  3.  Patient is able to go out socially with pain level </=  1/10.  Baseline: pain level 5-10/10 Goal status: INITIAL  4.  PFIQ-7 score decreased from 83 to </= 10 points due to reduction of pain.  Baseline: PFIQ-7 is 83  Goal status: INITIAL   PLAN: PT FREQUENCY: 1x/week  PT DURATION: 12 weeks  PLANNED INTERVENTIONS: Therapeutic exercises, Therapeutic activity, Neuromuscular re-education, Balance training, Self Care, Joint mobilization, Dry Needling, Electrical stimulation, Cryotherapy, Moist heat, Taping, Biofeedback, and Manual therapy  PLAN FOR NEXT SESSION: dry needle the hip adductor, manual work to the pelvic floor muscles and hip adductors, hip stretches   Eulis Foster, PT 11/01/21 1:21 PM

## 2021-11-01 ENCOUNTER — Encounter: Payer: Self-pay | Admitting: Physical Therapy

## 2021-11-01 ENCOUNTER — Other Ambulatory Visit: Payer: Self-pay

## 2021-11-01 ENCOUNTER — Ambulatory Visit: Payer: BC Managed Care – PPO | Attending: Certified Nurse Midwife | Admitting: Physical Therapy

## 2021-11-01 DIAGNOSIS — R252 Cramp and spasm: Secondary | ICD-10-CM | POA: Insufficient documentation

## 2021-11-01 DIAGNOSIS — R102 Pelvic and perineal pain: Secondary | ICD-10-CM | POA: Diagnosis not present

## 2021-11-28 DIAGNOSIS — Z113 Encounter for screening for infections with a predominantly sexual mode of transmission: Secondary | ICD-10-CM | POA: Diagnosis not present

## 2021-11-28 DIAGNOSIS — J029 Acute pharyngitis, unspecified: Secondary | ICD-10-CM | POA: Diagnosis not present

## 2021-11-28 DIAGNOSIS — B3731 Acute candidiasis of vulva and vagina: Secondary | ICD-10-CM | POA: Diagnosis not present

## 2021-11-28 DIAGNOSIS — Z118 Encounter for screening for other infectious and parasitic diseases: Secondary | ICD-10-CM | POA: Diagnosis not present

## 2021-11-29 DIAGNOSIS — Z6839 Body mass index (BMI) 39.0-39.9, adult: Secondary | ICD-10-CM | POA: Diagnosis not present

## 2021-11-29 DIAGNOSIS — J029 Acute pharyngitis, unspecified: Secondary | ICD-10-CM | POA: Diagnosis not present

## 2021-11-29 DIAGNOSIS — E559 Vitamin D deficiency, unspecified: Secondary | ICD-10-CM | POA: Diagnosis not present

## 2021-11-29 DIAGNOSIS — R300 Dysuria: Secondary | ICD-10-CM | POA: Diagnosis not present

## 2021-11-29 DIAGNOSIS — Z0000 Encounter for general adult medical examination without abnormal findings: Secondary | ICD-10-CM | POA: Diagnosis not present

## 2021-12-04 ENCOUNTER — Ambulatory Visit: Payer: BC Managed Care – PPO | Attending: Certified Nurse Midwife | Admitting: Physical Therapy

## 2021-12-04 ENCOUNTER — Telehealth: Payer: Self-pay | Admitting: Physical Therapy

## 2021-12-04 DIAGNOSIS — R102 Pelvic and perineal pain: Secondary | ICD-10-CM | POA: Insufficient documentation

## 2021-12-04 DIAGNOSIS — R252 Cramp and spasm: Secondary | ICD-10-CM | POA: Insufficient documentation

## 2021-12-04 NOTE — Telephone Encounter (Signed)
Called patient and spoke to her. She thought her appointment was tomorrow in the afternoon. Discussed with her our no show policy. She will be at her next appointment on 8/30 at 4:15.  Eulis Foster, PT @8 /21/2023@ 8:28 AM

## 2021-12-13 ENCOUNTER — Encounter: Payer: Self-pay | Admitting: Physical Therapy

## 2021-12-20 ENCOUNTER — Ambulatory Visit: Payer: BC Managed Care – PPO | Attending: Certified Nurse Midwife | Admitting: Physical Therapy

## 2021-12-20 ENCOUNTER — Encounter: Payer: Self-pay | Admitting: Physical Therapy

## 2021-12-20 DIAGNOSIS — R102 Pelvic and perineal pain: Secondary | ICD-10-CM | POA: Diagnosis not present

## 2021-12-20 DIAGNOSIS — R252 Cramp and spasm: Secondary | ICD-10-CM | POA: Insufficient documentation

## 2021-12-20 NOTE — Therapy (Signed)
OUTPATIENT PHYSICAL THERAPY TREATMENT NOTE   Patient Name: Bailey Carney MRN: [id number] DOB:1990-08-21, 31 y.o., female Today's Date: 12/20/2021  PCP: [facility] provider REFERRING PROVIDER: Gabriel Carina, CNM  END OF SESSION:   PT End of Session - 12/20/21 1615     Visit Number 2    Date for PT Re-Evaluation 01/24/22    Authorization Type [employer], [employer]    Authorization - Visit Number 2    Authorization - Number of Visits 27    PT Start Time 1615    PT Stop Time 1655    PT Time Calculation (min) 40 min    Activity Tolerance Patient tolerated treatment well    Behavior During Therapy WFL for tasks assessed/performed             Past Medical History:  Diagnosis Date   Asthma    does not use inhaler often   Chlamydia    Cholecystitis    Genital herpes    Hypertension    on meds   Past Surgical History:  Procedure Laterality Date   NO PAST SURGERIES     Patient Active Problem List   Diagnosis Date Noted   Paresthesias 08/26/2019   Asthma 05/21/2019   Hypertension 05/21/2019   Postpartum depression 04/29/2019   REFERRING DIAG: R10.2 (ICD-10-CM) - Pelvic pain   THERAPY DIAG:  Cramp and spasm   Pelvic pain   Rationale for Evaluation and Treatment Rehabilitation   ONSET DATE: 08/2021   SUBJECTIVE:                                                                                                                                                                                            SUBJECTIVE STATEMENT: Patient reports she has been fine. She has not had any pain.        PAIN:  Are you having pain? Yes NPRS scale: 7/10 Pain location: Right, pubic bone   Pain type: sharp  Pain description: intermittent    Aggravating factors: during intercourse, random[place] Relieving factors: not sure   PRECAUTIONS: None   WEIGHT BEARING RESTRICTIONS No   FALLS:  Has patient fallen in last 6 months? No   LIVING ENVIRONMENT: Lives  with: lives with their family   OCCUPATION: works at home   PLOF: [place] reduce pain   PERTINENT HISTORY:  None     INTERCOURSE Pain with intercourse:  sore afterwards Ability to have vaginal penetration:  Yes:   Marinoff Scale: 1/3   PREGNANCY Vaginal deliveries 1   C-section deliveries 0 Currently pregnant No   OBJECTIVE:  DIAGNOSTIC FINDINGS:  None   PATIENT SURVEYS:  PFIQ-7 83; POPIQ-7 83   COGNITION:            Overall cognitive status: Within functional limits for tasks assessed                          SENSATION:            Light touch: Appears intact            Proprioception: Appears intact   LUMBAR SPECIAL TESTS:  Trendelenburg sign: Positive on left and right hip drops; She will not drop the hip if she contracts the gluteus medius with tactile cues.                   POSTURE: No Significant postural limitations               PELVIC ALIGNMENT:   LUMBARAROM/PROM full lumbar ROM     LOWER EXTREMITY ROM:   Passive ROM Right eval Left eval  Hip external rotation 40 50   (Blank rows = not tested)   LOWER EXTREMITY MMT:   MMT Right eval Left eval  Hip abduction 3+/5 4+/5  Hip adduction 4/5 4+/5     PALPATION:   General  tenderness located suprapubically, medial side of bilateral ASIS, left side of pubic bone superiorly, right hip adductor, right obturator internist 12/20/2021 ASIS are equal.                  External Perineal Exam tenderness along the right hip adductor, along the left ischiocavernosus                             Internal Pelvic Floor tenderness located on right side of bladder, restrictions of the anterior cervix, tenderness located on the right obturator internist, along the iliococycgeus   Patient confirms identification and approves PT to assess internal pelvic floor and treatment Yes   PELVIC MMT:   MMT eval 12/20/2021  Vaginal 3/5 for 1 second at times she will bulge the pelvic floor 3/5 for  3-5 seconds with slow recruitment and no bulging of the pelvic floor.   (Blank rows = not tested)         TONE: increased   PROLAPSE: None   TODAY'S TREATMENT  12/20/2021 Manual: Internal pelvic floor techniques:No emotional/communication barriers or cognitive limitation. Patient is motivated to learn. Patient understands and agrees with treatment goals and plan. PT explains patient will be examined in standing, sitting, and lying down to see how their muscles and joints work. When they are ready, they will be asked to remove their underwear so PT can examine their perineum. The patient is also given the option of providing their own chaperone as one is not provided in our facility. The patient also has the right and is explained the right to defer or refuse any part of the evaluation or treatment including the internal exam. With the patient's consent, PT will use one gloved finger to gently assess the muscles of the pelvic floor, seeing how well it contracts and relaxes and if there is muscle symmetry. After, the patient will get dressed and PT and patient will discuss exam findings and plan of care. PT and patient discuss plan of care, schedule, attendance policy and HEP activities.  Going through the vagina working on bilateral levator ani and obturator internist monitoring for pain  and texture of the tissue.  While moving the hips.  Neuromuscular re-education: Pelvic floor contraction training:while therapist finger in the vaginal canal giving tactile cues to recruit the pelvic floor muscles and form a circular contraction. Verbal cues to not contract her gluteals.  Exercises: Stretches/mobility:hamstring stretch sitting holding for 30 sec bil.  Piriformis stretch in sitting holding 30 sec bil.  Standing hip adductor stretch holding for 30 sec bil.      PATIENT EDUCATION: 12/20/2021 Education details: Access Code: [email or web address] Person educated: Patient Education method: Explanation,  Demonstration, Tactile cues, Verbal cues, and Handouts Education comprehension: verbalized understanding, returned demonstration, verbal cues required, tactile cues required, and needs further education      HOME EXERCISE PROGRAM: 12/20/2021  Access Code: [email or web address] URL: [email or web address] Date: 12/20/2021 Prepared by: Earlie Counts  Exercises - Seated Piriformis Stretch with Trunk Bend  - 1 x daily - 7 x weekly - 1 sets - 2 reps - 30 sec hold - Seated Hamstring Stretch  - 1 x daily - 7 x weekly - 1 sets - 2 reps - 30 sec hold - Side Lunge Adductor Stretch  - 1 x daily - 7 x weekly - 1 sets - 2 reps - 30 sec hold - Seated Pelvic Floor Contraction  - 3 x daily - 7 x weekly - 1 sets - 5 reps - 5 sec hold ASSESSMENT:   CLINICAL IMPRESSION: Patient is a 31 y.o. female who was seen today for physical therapy treatment for pelvic pain. Pelvic floor strength is 3/5 with slow recruitment. She does not bulge her pelvic floor when she contracts but needs verbal cues to not contract her gluteals. She has more trigger points in the right pelvic floor muscles compared to the left.  Patient pain level decreased from 10/10 to 7/10. Her pain is not as often.  Patient will benefit from skilled therapy to reduce her pain and improve quality of life.      OBJECTIVE IMPAIRMENTS decreased activity tolerance, decreased endurance, decreased ROM, decreased strength, increased fascial restrictions, increased muscle spasms, and pain.    ACTIVITY LIMITATIONS locomotion level   PARTICIPATION LIMITATIONS: community activity   PERSONAL FACTORS  none  are also affecting patient's functional outcome.    REHAB POTENTIAL: Excellent   CLINICAL DECISION MAKING: Stable/uncomplicated   EVALUATION COMPLEXITY: Low     GOALS: Goals reviewed with patient? Yes   SHORT TERM GOALS: Target date: 11/29/2021   Patient independent with her stretches to elongate the muscles.  Baseline:Not educated yet Goal  status: INITIAL   LONG TERM GOALS: Target date: 01/24/2022    Patient independent with advanced HEP to increase core and hip strength.  Baseline: Not educated yet Goal status: INITIAL   2.  Patient is able to walk without pain increasing </= 1/10 due to reduction of trigger points in the pelvic floor.  Baseline: pain level 5-10/10 Goal status: Met 12/20/2021  3.  Patient is able to go out socially with pain level </= 1/10.  Baseline: pain level 5-10/10 Goal status: INITIAL   4.  PFIQ-7 score decreased from 83 to </= 10 points due to reduction of pain.  Baseline: PFIQ-7 is 83  Goal status: INITIAL     PLAN: PT FREQUENCY: 1x/week   PT DURATION: 12 weeks   PLANNED INTERVENTIONS: Therapeutic exercises, Therapeutic activity, Neuromuscular re-education, Balance training, Self Care, Joint mobilization, Dry Needling, Electrical stimulation, Cryotherapy, Moist heat, Taping, Biofeedback, and Manual therapy   PLAN FOR NEXT SESSION:  core strength and  pelvic floor strength, hip IR with hip hinging.  Earlie Counts, PT 12/20/21 4:55 PM

## 2022-01-03 ENCOUNTER — Ambulatory Visit: Payer: BC Managed Care – PPO | Admitting: Physical Therapy

## 2022-01-10 ENCOUNTER — Ambulatory Visit: Payer: BC Managed Care – PPO | Admitting: Physical Therapy

## 2022-01-10 ENCOUNTER — Encounter: Payer: Self-pay | Admitting: Physical Therapy

## 2022-01-10 DIAGNOSIS — R102 Pelvic and perineal pain: Secondary | ICD-10-CM | POA: Diagnosis not present

## 2022-01-10 DIAGNOSIS — R252 Cramp and spasm: Secondary | ICD-10-CM | POA: Diagnosis not present

## 2022-01-10 NOTE — Therapy (Signed)
OUTPATIENT PHYSICAL THERAPY TREATMENT NOTE   Patient Name: Bailey Carney MRN: [id number] DOB:[birth year], 31 y.o., female Today's Date: 01/10/2022  PCP: [facility] provider REFERRING PROVIDER: Gabriel Carina, CNM  END OF SESSION:   PT End of Session - 01/10/22 1622     Visit Number 3    Date for PT Re-Evaluation 01/24/22    Authorization Type [employer], [employer]    Authorization - Visit Number 3    Authorization - Number of Visits 6    PT Start Time 1622   came late   PT Stop Time 1648    PT Time Calculation (min) 26 min    Activity Tolerance Patient tolerated treatment well    Behavior During Therapy WFL for tasks assessed/performed             Past Medical History:  Diagnosis Date   Asthma    does not use inhaler often   Chlamydia    Cholecystitis    Genital herpes    Hypertension    on meds   Past Surgical History:  Procedure Laterality Date   NO PAST SURGERIES     Patient Active Problem List   Diagnosis Date Noted   Paresthesias 08/26/2019   Asthma 05/21/2019   Hypertension 05/21/2019   Postpartum depression 04/29/2019  REFERRING DIAG: R10.2 (ICD-10-CM) - Pelvic pain   THERAPY DIAG:  Cramp and spasm   Pelvic pain   Rationale for Evaluation and Treatment Rehabilitation   ONSET DATE: 08/2021   SUBJECTIVE:                                                                                                                                                                                            SUBJECTIVE STATEMENT: Patient has not had pain.        PAIN:  Are you having pain? Yes NPRS scale: 0/10 Pain location: Right, pubic bone   Pain type: sharp  Pain description: intermittent    Aggravating factors: during intercourse, random[place] Relieving factors: not sure   PRECAUTIONS: None   WEIGHT BEARING RESTRICTIONS No   FALLS:  Has patient fallen in last 6 months? No   LIVING ENVIRONMENT: Lives with: lives with their  family   OCCUPATION: works at home   PLOF: [place] reduce pain   PERTINENT HISTORY:  None     INTERCOURSE Pain with intercourse:  sore afterwards Ability to have vaginal penetration:  Yes:   Marinoff Scale: 1/3   PREGNANCY Vaginal deliveries 1   C-section deliveries 0 Currently pregnant No   OBJECTIVE:    DIAGNOSTIC FINDINGS:  None   PATIENT SURVEYS:  PFIQ-7 0; POPIQ-7 0   COGNITION:            Overall cognitive status: Within functional limits for tasks assessed                          SENSATION:            Light touch: Appears intact            Proprioception: Appears intact   LUMBAR SPECIAL TESTS:  Trendelenburg sign: Positive on left and right hip drops; She will not drop the hip if she contracts the gluteus medius with tactile cues.                   POSTURE: No Significant postural limitations               PELVIC ALIGNMENT:   LUMBARAROM/PROM full lumbar ROM     LOWER EXTREMITY ROM:   Passive ROM Right 01/10/2022 Left 01/10/2022  Hip external rotation 60 65   (Blank rows = not tested)   LOWER EXTREMITY MMT:   MMT Right 01/10/2022 Left 01/10/2022  Hip abduction 5/5 5/5  Hip adduction 5/5 5/5     PALPATION:   General  tenderness located suprapubically, medial side of bilateral ASIS, left side of pubic bone superiorly, right hip adductor, right obturator internist 12/20/2021 ASIS are equal.                  External Perineal Exam tenderness along the right hip adductor, along the left ischiocavernosus                             Internal Pelvic Floor tenderness located on right side of bladder, restrictions of the anterior cervix, tenderness located on the right obturator internist, along the iliococycgeus   Patient confirms identification and approves PT to assess internal pelvic floor and treatment Yes   PELVIC MMT:   MMT eval 12/20/2021  Vaginal 3/5 for 1 second at times she will bulge the pelvic floor 3/5 for 3-5  seconds with slow recruitment and no bulging of the pelvic floor.   (Blank rows = not tested)         TONE: increased   PROLAPSE: None   TODAY'S TREATMENT  01/10/2022 Neuromuscular re-education: Core retraining: Core facilitation:dead bug with red band around knees and hands hold green band Bridge with knees out and red band around the knees SLR with red band around the knees  10x each side Sidely clam 10x each side with red band around knees Exercises: Stretches/mobility:hamstring stretch with strap holding 30sec bil.      12/20/2021 Manual: Internal pelvic floor techniques:No emotional/communication barriers or cognitive limitation. Patient is motivated to learn. Patient understands and agrees with treatment goals and plan. PT explains patient will be examined in standing, sitting, and lying down to see how their muscles and joints work. When they are ready, they will be asked to remove their underwear so PT can examine their perineum. The patient is also given the option of providing their own chaperone as one is not provided in our facility. The patient also has the right and is explained the right to defer or refuse any part of the evaluation or treatment including the internal exam. With the patient's consent, PT will use one gloved finger to gently assess the muscles of the pelvic floor, seeing  how well it contracts and relaxes and if there is muscle symmetry. After, the patient will get dressed and PT and patient will discuss exam findings and plan of care. PT and patient discuss plan of care, schedule, attendance policy and HEP activities.  Going through the vagina working on bilateral levator ani and obturator internist monitoring for pain and texture of the tissue.  While moving the hips.  Neuromuscular re-education: Pelvic floor contraction training:while therapist finger in the vaginal canal giving tactile cues to recruit the pelvic floor muscles and form a circular contraction.  Verbal cues to not contract her gluteals.  Exercises: Stretches/mobility:hamstring stretch sitting holding for 30 sec bil.  Piriformis stretch in sitting holding 30 sec bil.  Standing hip adductor stretch holding for 30 sec bil.       PATIENT EDUCATION: 01/10/2022 Education details: Access Code: [email or web address] Person educated: Patient Education method: Explanation, Demonstration, Tactile cues, Verbal cues, and Handouts Education comprehension: verbalized understanding, returned demonstration, verbal cues required, tactile cues required, and needs further education       HOME EXERCISE PROGRAM: 01/10/2022  Access Code: [email or web address] URL: [email or web address] Date: 01/10/2022 Prepared by: Earlie Counts  Exercises - Seated Piriformis Stretch with Trunk Bend  - 1 x daily - 7 x weekly - 1 sets - 2 reps - 30 sec hold - Seated Hamstring Stretch  - 1 x daily - 7 x weekly - 1 sets - 2 reps - 30 sec hold - Side Lunge Adductor Stretch  - 1 x daily - 7 x weekly - 1 sets - 2 reps - 30 sec hold - Seated Pelvic Floor Contraction  - 3 x daily - 7 x weekly - 1 sets - 5 reps - 5 sec hold - Dead Bug  - 1 x daily - 3 x weekly - 3 sets - 10 reps - Straight Leg Raise  - 1 x daily - 3 x weekly - 2 sets - 10 reps - Clamshell with Resistance  - 1 x daily - 7 x weekly - 2 sets - 10 reps   ASSESSMENT:   CLINICAL IMPRESSION: Patient is a 31 y.o. female who was seen today for physical therapy treatment for pelvic pain. Pelvic floor strength is 3/5 with slow recruitment. She does not bulge her pelvic floor when she contracts but needs verbal cues to not contract her gluteals. She has more trigger points in the right pelvic floor muscles compared to the left.  Patient pain level decreased from 10/10 to 7/10. Her pain is not as often.  Patient will benefit from skilled therapy to reduce her pain and improve quality of life.      OBJECTIVE IMPAIRMENTS decreased activity tolerance, decreased endurance, decreased  ROM, decreased strength, increased fascial restrictions, increased muscle spasms, and pain.    ACTIVITY LIMITATIONS locomotion level   PARTICIPATION LIMITATIONS: community activity   PERSONAL FACTORS  none  are also affecting patient's functional outcome.    REHAB POTENTIAL: Excellent   CLINICAL DECISION MAKING: Stable/uncomplicated   EVALUATION COMPLEXITY: Low     GOALS: Goals reviewed with patient? Yes   SHORT TERM GOALS: Target date: 11/29/2021   Patient independent with her stretches to elongate the muscles.  Baseline:Not educated yet Goal status: Met 01/10/2022  LONG TERM GOALS: Target date: 01/24/2022    Patient independent with advanced HEP to increase core and hip strength.  Baseline: Not educated yet Goal status: Met 01/10/2022   2.  Patient is able to walk without pain increasing </= 1/10 due  to reduction of trigger points in the pelvic floor.  Baseline: pain level 5-10/10 Goal status: Met 12/20/2021  3.  Patient is able to go out socially with pain level </= 1/10.  Baseline: pain level 5-10/10 Goal status: Met 01/10/2022   4.  PFIQ-7 score decreased from 83 to </= 10 points due to reduction of pain.  Baseline: PFIQ-7 is 83  Goal status: Met 01/10/2022     PLAN: PT FREQUENCY: 1x/week   PT DURATION: 12 weeks   PLANNED INTERVENTIONS: Therapeutic exercises, Therapeutic activity, Neuromuscular re-education, Balance training, Self Care, Joint mobilization, Dry Needling, Electrical stimulation, Cryotherapy, Moist heat, Taping, Biofeedback, and Manual therapy   PLAN FOR NEXT SESSION:  discharge to HEP  Earlie Counts, PT 01/10/22 4:52 PM    PHYSICAL THERAPY DISCHARGE SUMMARY  Visits from Start of Care: 3  Current functional level related to goals / functional outcomes: See above.    Remaining deficits: See above.    Education / Equipment: HEP   Patient agrees to discharge. Patient goals were met. Patient is being discharged due to meeting the stated rehab  goals. Thank you for the referral. Earlie Counts, PT 01/10/22 4:52 PM

## 2022-01-17 ENCOUNTER — Ambulatory Visit: Payer: BC Managed Care – PPO | Admitting: Physical Therapy

## 2022-01-31 ENCOUNTER — Encounter: Payer: BC Managed Care – PPO | Admitting: Physical Therapy

## 2022-02-02 DIAGNOSIS — E559 Vitamin D deficiency, unspecified: Secondary | ICD-10-CM | POA: Diagnosis not present

## 2022-02-02 DIAGNOSIS — Z6838 Body mass index (BMI) 38.0-38.9, adult: Secondary | ICD-10-CM | POA: Diagnosis not present

## 2022-02-02 DIAGNOSIS — I10 Essential (primary) hypertension: Secondary | ICD-10-CM | POA: Diagnosis not present

## 2022-02-28 DIAGNOSIS — J029 Acute pharyngitis, unspecified: Secondary | ICD-10-CM | POA: Diagnosis not present

## 2022-04-04 DIAGNOSIS — Z6837 Body mass index (BMI) 37.0-37.9, adult: Secondary | ICD-10-CM | POA: Diagnosis not present

## 2022-04-04 DIAGNOSIS — R059 Cough, unspecified: Secondary | ICD-10-CM | POA: Diagnosis not present

## 2022-04-04 DIAGNOSIS — U071 COVID-19: Secondary | ICD-10-CM | POA: Diagnosis not present

## 2022-04-04 DIAGNOSIS — R030 Elevated blood-pressure reading, without diagnosis of hypertension: Secondary | ICD-10-CM | POA: Diagnosis not present

## 2022-05-03 DIAGNOSIS — Z79899 Other long term (current) drug therapy: Secondary | ICD-10-CM | POA: Diagnosis not present

## 2022-05-03 DIAGNOSIS — Z131 Encounter for screening for diabetes mellitus: Secondary | ICD-10-CM | POA: Diagnosis not present

## 2022-05-03 DIAGNOSIS — I10 Essential (primary) hypertension: Secondary | ICD-10-CM | POA: Diagnosis not present

## 2022-05-03 DIAGNOSIS — Z6838 Body mass index (BMI) 38.0-38.9, adult: Secondary | ICD-10-CM | POA: Diagnosis not present

## 2022-06-22 IMAGING — US US OB < 14 WEEKS - US OB TV
1 series · 15 of 28 positions shown · non-contrast
Comparison: None Available.

CLINICAL DATA: Abdominal cramping since [REDACTED]

EXAM:
OBSTETRIC <14 WK US AND TRANSVAGINAL OB US
TECHNIQUE: Both transabdominal and transvaginal ultrasound examinations were
performed for complete evaluation of the gestation as well as the
maternal uterus, adnexal regions, and pelvic cul-de-sac.
Transvaginal technique was performed to assess early pregnancy.

[Series 1: us ob < 14 weeks - us ob tv · 15 of 67 slices shown]
[im 1/67]
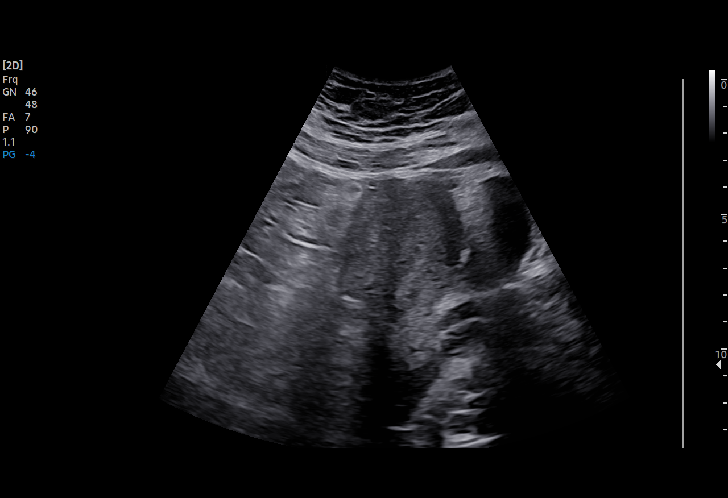
[im 5/67]
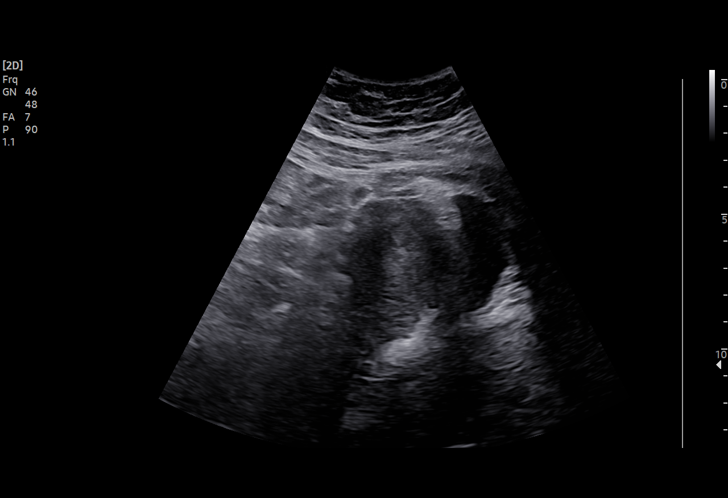
[im 10/67]
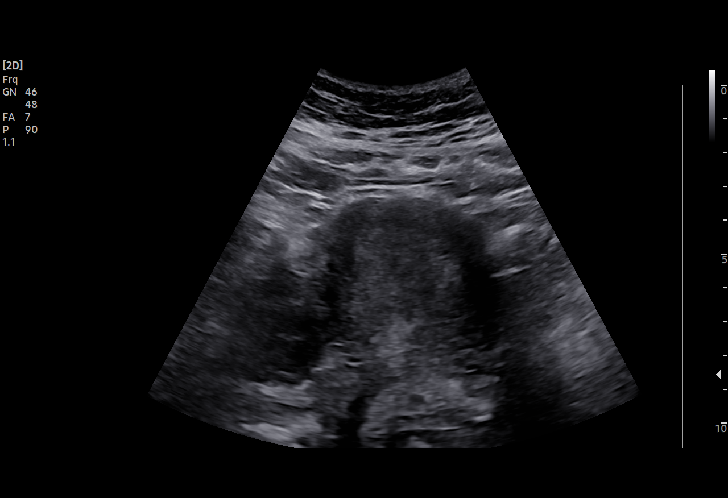
[im 15/67]
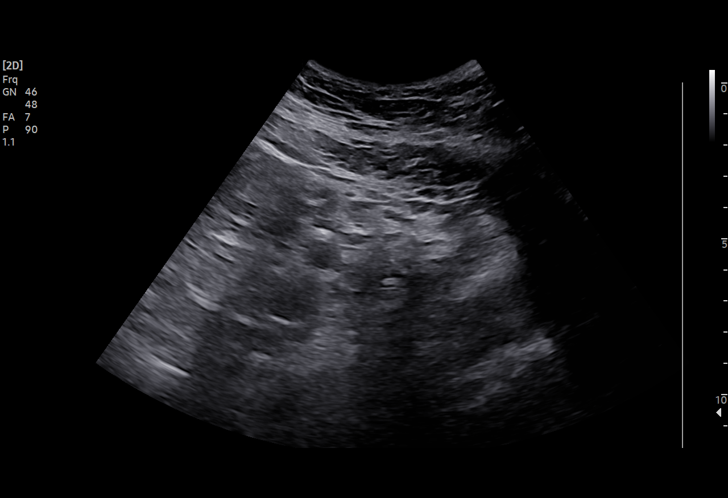
[im 20/67]
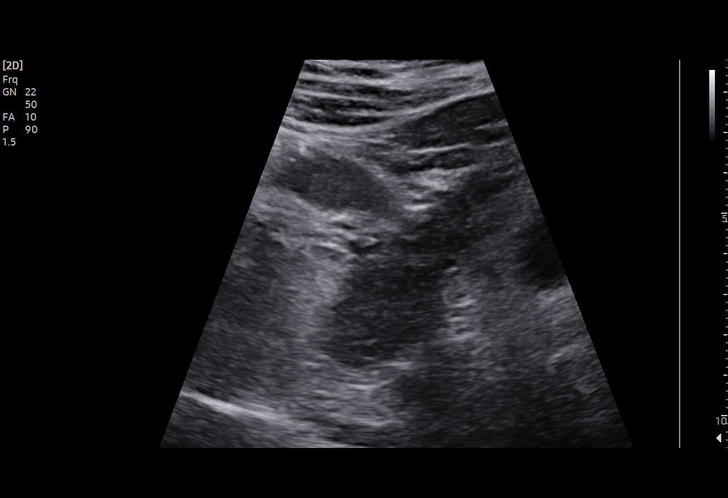
[im 25/67]
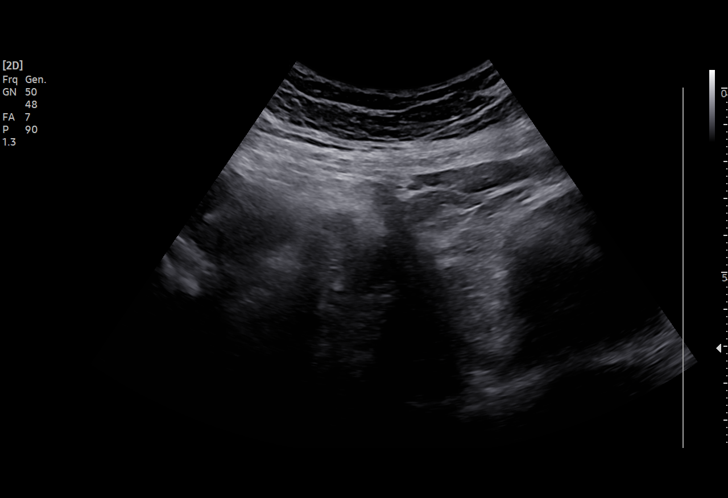
[im 30/67]
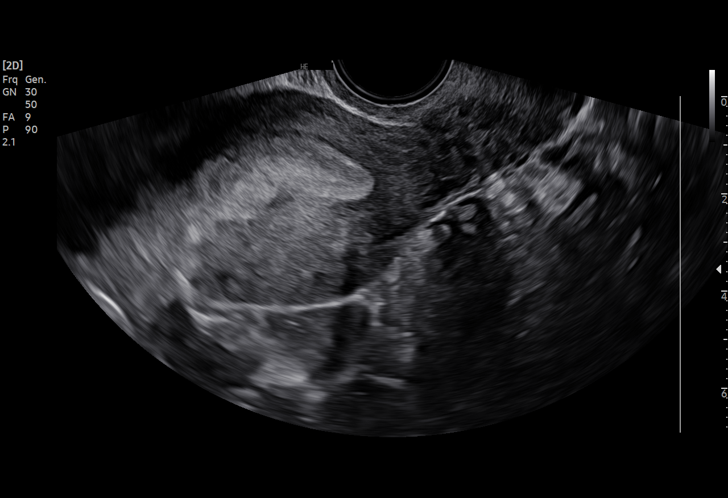
[im 35/67]
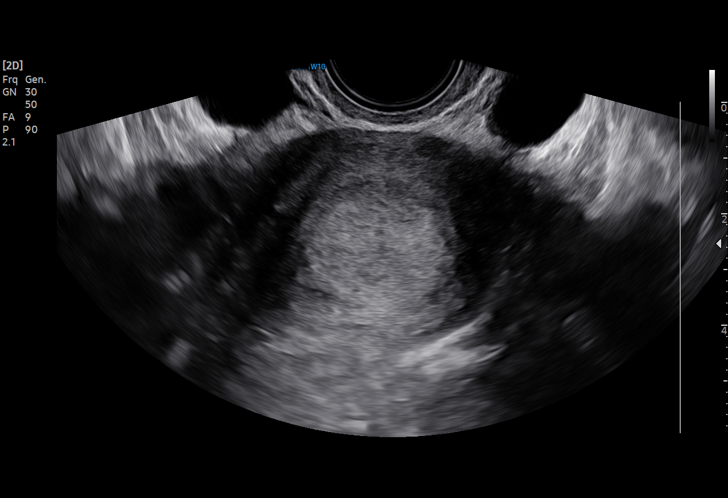
[im 37/67]
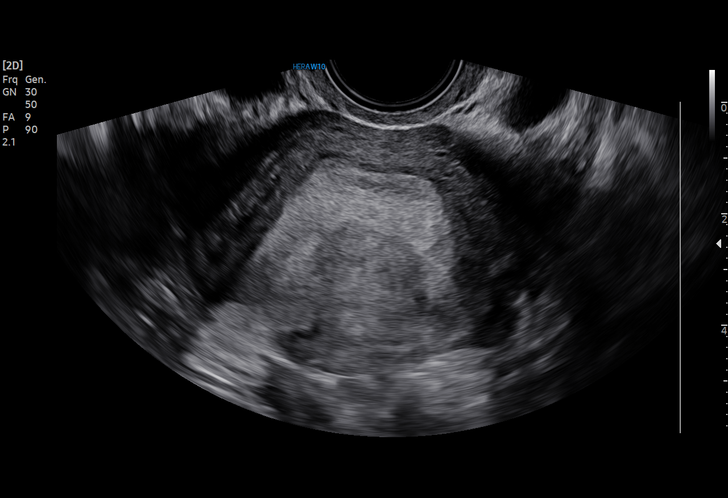
[im 42/67]
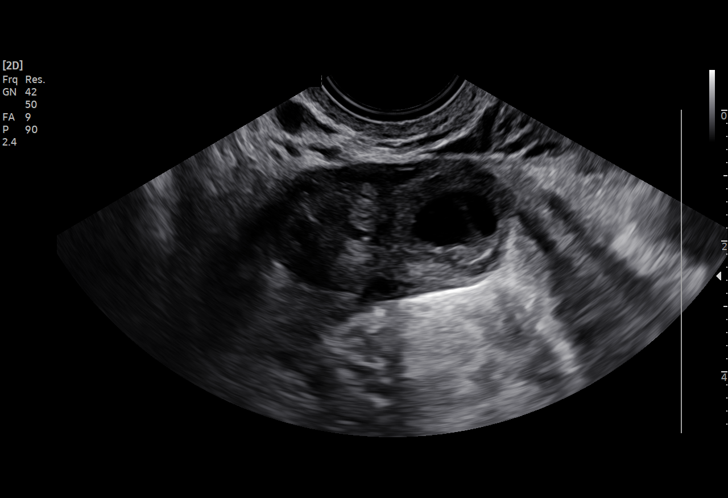
[im 47/67]
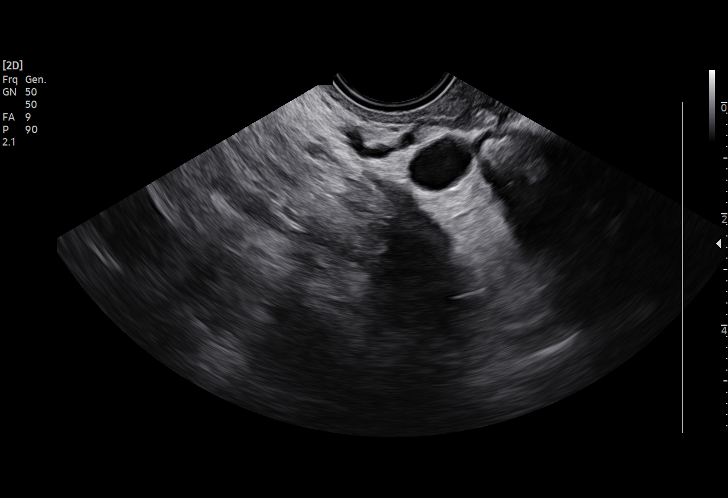
[im 52/67]
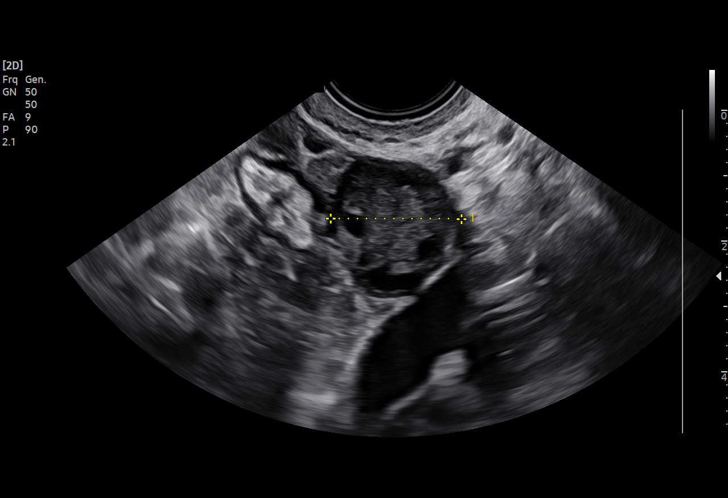
[im 57/67]
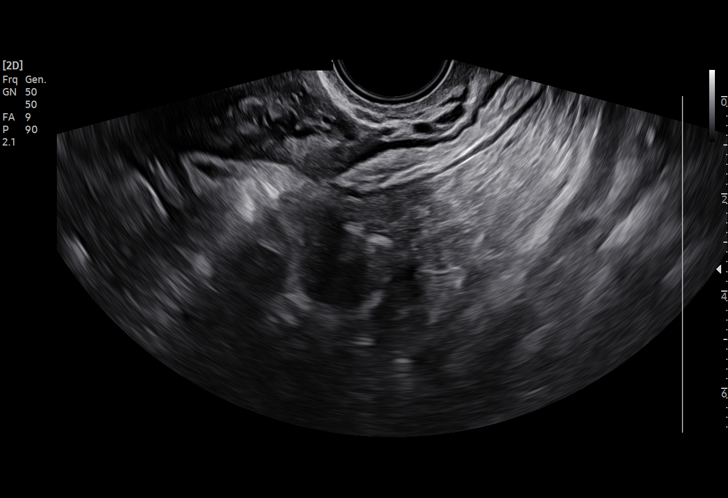
[im 62/67]
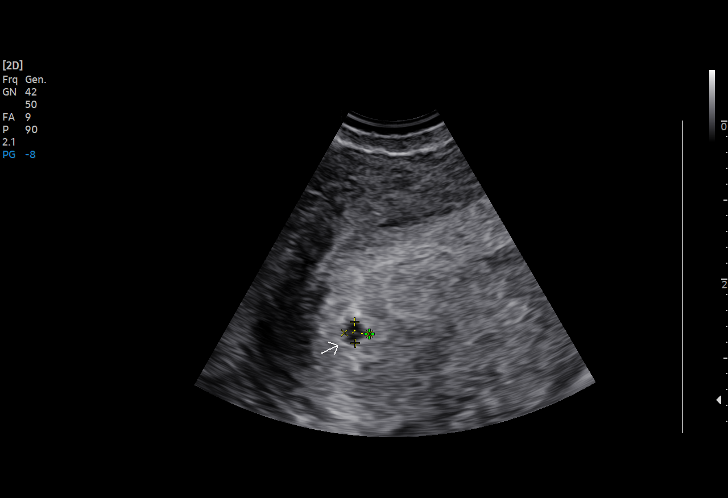
[im 67/67]
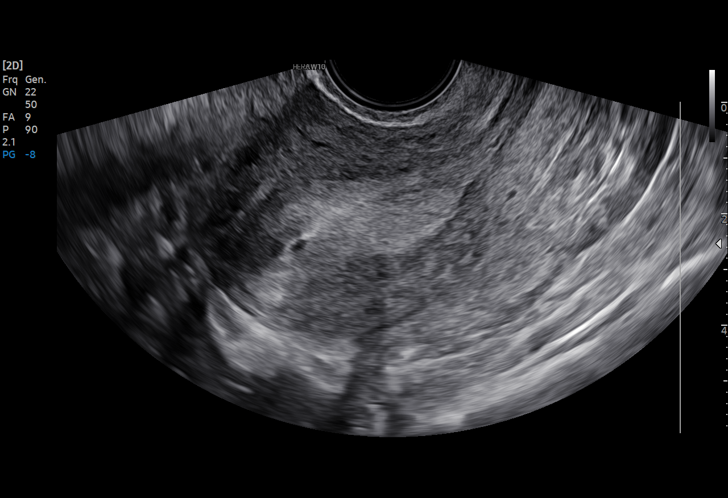

[15 of 28 positions shown; findings below may reference images not displayed]

FINDINGS: Intrauterine gestational sac: Small cystic area in the fundal region
of the endometrium measuring 3 mm, which could reflect a gestational
sac.

Yolk sac:  Not Visualized.

Embryo:  Not Visualized.

Cardiac Activity: Not Visualized.

MSD: 3.0 mm, 5 weeks 0 days

Maternal uterus/adnexae: Left ovary measures 2.0 x 3.1 x 2.2 cm in
the right ovary measures 2.6 x 2.2 x 3.7 cm. Likely corpus luteum
cyst within the right ovary measuring up to 2.0 cm. No imaging
characteristics to suggest ectopic pregnancy at this time. Trace
pelvic free fluid is likely physiologic.
IMPRESSION: 1. Possible early intrauterine gestational sac, but no yolk sac,
fetal pole, or cardiac activity yet visualized. Recommend follow-up
quantitative B-HCG levels and follow-up US in 14 days to assess
viability. This recommendation follows SRU consensus guidelines:
Diagnostic Criteria for Nonviable Pregnancy Early in the First
Trimester. N Engl J Med 1791; [DATE].
2. Probable right ovarian corpus luteum cyst.

## 2022-10-21 ENCOUNTER — Encounter (HOSPITAL_BASED_OUTPATIENT_CLINIC_OR_DEPARTMENT_OTHER): Payer: Self-pay

## 2022-10-21 ENCOUNTER — Emergency Department (HOSPITAL_BASED_OUTPATIENT_CLINIC_OR_DEPARTMENT_OTHER)
Admission: EM | Admit: 2022-10-21 | Discharge: 2022-10-21 | Disposition: A | Discharge status: Home / Self Care | Payer: BC Managed Care – PPO | Attending: Emergency Medicine | Admitting: Emergency Medicine

## 2022-10-21 DIAGNOSIS — S39012A Strain of muscle, fascia and tendon of lower back, initial encounter: Secondary | ICD-10-CM | POA: Insufficient documentation

## 2022-10-21 DIAGNOSIS — X500XXA Overexertion from strenuous movement or load, initial encounter: Secondary | ICD-10-CM | POA: Diagnosis not present

## 2022-10-21 DIAGNOSIS — M545 Low back pain, unspecified: Secondary | ICD-10-CM | POA: Diagnosis not present

## 2022-10-21 MED ORDER — LIDOCAINE 5 % EX PTCH
1.0000 | MEDICATED_PATCH | CUTANEOUS | Status: DC
  Administered 2022-10-21: 1 via TRANSDERMAL
  Filled 2022-10-21: qty 1

## 2022-10-21 MED ORDER — LIDOCAINE 5 % EX PTCH: 1.0000 | MEDICATED_PATCH | CUTANEOUS | 0 refills | Status: DC

## 2022-10-21 MED ORDER — ONDANSETRON 4 MG PO TBDP
8.0000 mg | ORAL_TABLET | Freq: Once | ORAL | Status: AC
  Administered 2022-10-21: 8 mg via ORAL
  Filled 2022-10-21: qty 2

## 2022-10-21 MED ORDER — METHOCARBAMOL 500 MG PO TABS: 500.0000 mg | ORAL_TABLET | Freq: Two times a day (BID) | ORAL | 0 refills | Status: DC

## 2022-10-21 MED ORDER — ETODOLAC 400 MG PO TABS: 400.0000 mg | ORAL_TABLET | Freq: Two times a day (BID) | ORAL | 0 refills | Status: DC

## 2022-10-21 MED ORDER — HYDROCODONE-ACETAMINOPHEN 5-325 MG PO TABS
1.0000 | ORAL_TABLET | Freq: Once | ORAL | Status: AC
  Administered 2022-10-21: 1 via ORAL
  Filled 2022-10-21: qty 1

## 2022-10-21 MED ORDER — DIAZEPAM 2 MG PO TABS
2.0000 mg | ORAL_TABLET | Freq: Once | ORAL | Status: AC
  Administered 2022-10-21: 2 mg via ORAL
  Filled 2022-10-21: qty 1

## 2022-10-21 MED ORDER — KETOROLAC TROMETHAMINE 15 MG/ML IJ SOLN
15.0000 mg | Freq: Once | INTRAMUSCULAR | Status: AC
  Administered 2022-10-21: 15 mg via INTRAMUSCULAR
  Filled 2022-10-21: qty 1

## 2022-10-21 NOTE — ED Provider Notes (Signed)
[place] EMERGENCY DEPARTMENT AT [facility] Provider Note   CSN: [id number] Arrival date & time: 10/21/22  2033     History  Chief Complaint  Patient presents with   Back Pain    Bailey Carney is a 32 y.o. female.  32 year old female presents today for evaluation of low back pain.  States she was moving a mattress that which was pretty heavy.  She states she had some pain in her low back prior to this but it got significantly worse after she moved a mattress and the box bring.  Denies any bowel or bladder dysfunction, saddle anesthesia, fever, paresthesias, or peripheral weakness.  The history is provided by the patient. No language interpreter was used.       Home Medications Prior to Admission medications   Medication Sig Start Date End Date Taking? Authorizing Provider  etodolac (LODINE) 400 MG tablet Take 1 tablet (400 mg total) by mouth 2 (two) times daily. 10/21/22  Yes Kaniesha Barile, PA-C  lidocaine (LIDODERM) 5 % Place 1 patch onto the skin daily. Remove & Discard patch within 12 hours or as directed by MD 10/21/22  Yes Karie Mainland, Claudy Abdallah, PA-C  methocarbamol (ROBAXIN) 500 MG tablet Take 1 tablet (500 mg total) by mouth 2 (two) times daily. 10/21/22  Yes Eilyn Polack, PA-C  albuterol (PROVENTIL HFA;VENTOLIN HFA) 108 (90 Base) MCG/ACT inhaler Inhale 1-2 puffs into the lungs every 6 (six) hours as needed for wheezing or shortness of breath. 11/14/17   Burleson, Brand Males, NP  amLODipine (NORVASC) 10 MG tablet Take by mouth. 06/20/20   [provider]  Drospirenone (SLYND) 4 MG TABS Take 1 tablet by mouth daily. 10/11/21   Bernerd Limbo, CNM  ibuprofen (ADVIL) 600 MG tablet Take 1 tablet (600 mg total) by mouth every 6 (six) hours as needed. 10/18/21   Bernerd Limbo, CNM  terconazole (TERAZOL 7) 0.4 % vaginal cream Place 1 applicator vaginally at bedtime. Use for seven days 10/14/21   Bernerd Limbo, CNM      Allergies    Amoxicillin    Review of Systems   Review of  Systems  Constitutional:  Negative for fever.  Genitourinary:  Negative for difficulty urinating.  Musculoskeletal:  Positive for back pain. Negative for gait problem.  Neurological:  Negative for weakness.  All other systems reviewed and are negative.   Physical Exam Updated Vital Signs BP (!) 147/96 (BP Location: Right Arm)   Pulse 74   Temp 98.1 F (36.7 C) (Oral)   Resp 17   Ht 5\' 7"  (1.702 m)   Wt 102.1 kg   LMP 09/23/2022 (Exact Date)   SpO2 100%   BMI 35.24 kg/m  Physical Exam Vitals and nursing note reviewed.  Constitutional:      General: She is not in acute distress.    Appearance: Normal appearance. She is not ill-appearing.  HENT:     Head: Normocephalic and atraumatic.     Nose: Nose normal.  Eyes:     General: No scleral icterus.    Extraocular Movements: Extraocular movements intact.     Conjunctiva/sclera: Conjunctivae normal.  Cardiovascular:     Rate and Rhythm: Normal rate.  Pulmonary:     Effort: Pulmonary effort is normal. No respiratory distress.  Abdominal:     General: There is no distension.     Palpations: Abdomen is soft.     Tenderness: There is no abdominal tenderness. There is no guarding.  Musculoskeletal:  General: Normal range of motion.     Cervical back: Normal range of motion.     Comments: Full range of motion bilateral upper and lower extremities with 5/5 strength in extensor and flexor muscle groups.  Patient able to ambulate without much difficulty.  Cervical, thoracic, lumbar spine without tenderness to palpation.  Tenderness palpation over the right lumbar paraspinal muscles.  Skin:    General: Skin is warm and dry.  Neurological:     General: No focal deficit present.     Mental Status: She is alert and oriented to person, place, and time. Mental status is at baseline.     ED Results / Procedures / Treatments   Labs (all labs ordered are listed, but only abnormal results are displayed) Labs Reviewed - No data to  display  EKG None  Radiology No results found.  Procedures Procedures    Medications Ordered in ED Medications  HYDROcodone-acetaminophen (NORCO/VICODIN) 5-325 MG per tablet 1 tablet (has no administration in time range)  diazepam (VALIUM) tablet 2 mg (has no administration in time range)  ondansetron (ZOFRAN-ODT) disintegrating tablet 8 mg (has no administration in time range)  lidocaine (LIDODERM) 5 % 1 patch (has no administration in time range)  ketorolac (TORADOL) 15 MG/ML injection 15 mg (has no administration in time range)    ED Course/ Medical Decision Making/ A&P                             Medical Decision Making Risk Prescription drug management.   32 year old female presents today for concern of lower back pain.  Got worse after she moved some furniture today.  He has lumbar paraspinal muscle tenderness to palpation.  She is without red flag signs or symptoms to raise suspicion for cauda equina syndrome, or spinal epidural abscess.  Exam overall reassuring.  Multimodal pain regimen given in the emergency department.  Prescribe Lodine, Robaxin, and lidocaine patch.  Symptomatic management discussed.  Return precautions discussed.  Patient voices understanding and is in agreement with plan.   Final Clinical Impression(s) / ED Diagnoses Final diagnoses:  Strain of lumbar region, initial encounter    Rx / DC Orders ED Discharge Orders          Ordered    methocarbamol (ROBAXIN) 500 MG tablet  2 times daily        10/21/22 2109    lidocaine (LIDODERM) 5 %  Every 24 hours        10/21/22 2109    etodolac (LODINE) 400 MG tablet  2 times daily        10/21/22 2109              Marita Kansas, PA-C 10/21/22 2114    Virgina Norfolk, DO 10/21/22 2156

## 2022-10-21 NOTE — Discharge Instructions (Addendum)
You likely have a muscle strain of your low back muscle.  You received a few medications in the emergency room.  I have sent some medications into the pharmacy for you.  If you have any concerning symptoms return to the emergency room otherwise please follow-up with primary care provider.

## 2022-10-21 NOTE — ED Triage Notes (Signed)
States strained her back while moving her furniture around in her room.   Tylenol @1800 

## 2022-10-21 NOTE — ED Notes (Signed)
Discharge instructions reviewed. Verbalized understanding of workup and follow up.   Pharmacy verified and new medications reviewed.   Pt has a ride present on departure and ambulatory. Displays no signs of distress.

## 2022-11-22 DIAGNOSIS — L680 Hirsutism: Secondary | ICD-10-CM | POA: Diagnosis not present

## 2022-11-22 DIAGNOSIS — L700 Acne vulgaris: Secondary | ICD-10-CM | POA: Diagnosis not present

## 2022-12-02 DIAGNOSIS — E7800 Pure hypercholesterolemia, unspecified: Secondary | ICD-10-CM | POA: Diagnosis not present

## 2022-12-02 DIAGNOSIS — R0602 Shortness of breath: Secondary | ICD-10-CM | POA: Diagnosis not present

## 2022-12-02 DIAGNOSIS — I10 Essential (primary) hypertension: Secondary | ICD-10-CM | POA: Diagnosis not present

## 2022-12-02 DIAGNOSIS — R5383 Other fatigue: Secondary | ICD-10-CM | POA: Diagnosis not present

## 2022-12-02 DIAGNOSIS — Z3200 Encounter for pregnancy test, result unknown: Secondary | ICD-10-CM | POA: Diagnosis not present

## 2022-12-02 DIAGNOSIS — E88819 Insulin resistance, unspecified: Secondary | ICD-10-CM | POA: Diagnosis not present

## 2022-12-02 DIAGNOSIS — E559 Vitamin D deficiency, unspecified: Secondary | ICD-10-CM | POA: Diagnosis not present

## 2022-12-02 DIAGNOSIS — Z131 Encounter for screening for diabetes mellitus: Secondary | ICD-10-CM | POA: Diagnosis not present

## 2022-12-02 DIAGNOSIS — E6609 Other obesity due to excess calories: Secondary | ICD-10-CM | POA: Diagnosis not present

## 2022-12-02 DIAGNOSIS — Z6836 Body mass index (BMI) 36.0-36.9, adult: Secondary | ICD-10-CM | POA: Diagnosis not present

## 2023-01-09 ENCOUNTER — Ambulatory Visit (INDEPENDENT_AMBULATORY_CARE_PROVIDER_SITE_OTHER): Payer: BC Managed Care – PPO | Admitting: Obstetrics and Gynecology

## 2023-01-09 ENCOUNTER — Other Ambulatory Visit (HOSPITAL_COMMUNITY)
Admission: RE | Admit: 2023-01-09 | Discharge: 2023-01-09 | Disposition: A | Discharge status: Home / Self Care | Payer: BC Managed Care – PPO | Source: Ambulatory Visit | Attending: Obstetrics and Gynecology | Admitting: Obstetrics and Gynecology

## 2023-01-09 ENCOUNTER — Encounter: Payer: Self-pay | Admitting: Obstetrics and Gynecology

## 2023-01-09 VITALS — BP 140/96 | HR 72 | Ht 66.0 in | Wt 224.2 lb

## 2023-01-09 DIAGNOSIS — Z01419 Encounter for gynecological examination (general) (routine) without abnormal findings: Secondary | ICD-10-CM | POA: Diagnosis not present

## 2023-01-09 DIAGNOSIS — R102 Pelvic and perineal pain: Secondary | ICD-10-CM | POA: Diagnosis not present

## 2023-01-09 DIAGNOSIS — L680 Hirsutism: Secondary | ICD-10-CM | POA: Diagnosis not present

## 2023-01-09 MED ORDER — SPIRONOLACTONE 50 MG PO TABS
50.0000 mg | ORAL_TABLET | Freq: Two times a day (BID) | ORAL | 2 refills | Status: DC
Start: 2023-01-09 — End: 2023-09-24

## 2023-01-09 NOTE — Progress Notes (Unsigned)
Pt presents for establish care. Pt has concerns about PCOS. Pt reports cyst on ovaries (US done 09-13-21), pelvic pain and excessive facial hair. Last PAP 10-11-17 Requesting PAP, declines STD testing

## 2023-01-09 NOTE — Progress Notes (Unsigned)
   GYNECOLOGY PROGRESS NOTE  History:  32 y.o. G2P1011 presents to [facility] office today for problem gyn visit. She reports *****.  Excessive facial hair started within the last two years, have to shave almost daily. Normal monthly cycle, lasting about 5 days. Had recent blood work with [facility]. Irregular pelvic pain, started past couple months, mostly feels during cycle or ovulation. Had this in the past and tried     Pt presents for establish care. Pt has concerns about PCOS. Pt reports cyst on ovaries (US done 09-13-21), pelvic pain and excessive facial hair. Last PAP 10-11-17    iness, shortness of breath, n/v, or fever/chills.    The following portions of the patient's history were reviewed and updated as appropriate: allergies, current medications, past family history, past medical history, past social history, past surgical history and problem list. Last pap smear on *** was normal, *** HRHPV.  Health Maintenance Due  Topic Date Due   Hepatitis C Screening  Never done   Cervical Cancer Screening (HPV/Pap Cotest)  11/12/2022   INFLUENZA VACCINE  11/15/2022   COVID-19 Vaccine (1 - 2023-24 season) Never done     Review of Systems:  Pertinent items are noted in HPI.   Objective:  Physical Exam Blood pressure (!) 140/96, pulse 72, height 5\' 6"  (1.676 m), weight 224 lb 3.2 oz (101.7 kg), last menstrual period 12/21/2022. VS reviewed, nursing note reviewed,  Constitutional: well developed, well nourished, no distress HEENT: normocephalic CV: normal rate Pulm/chest wall: normal effort Breast Exam: deferred Abdomen: soft Neuro: alert and oriented x 3 Skin: warm, dry Psych: affect normal Pelvic exam: Cervix pink, visually closed, without lesion, scant white creamy discharge, vaginal walls and external genitalia normal Bimanual exam: Cervix 0/long/high, firm, anterior, neg CMT, uterus nontender, nonenlarged, adnexa without tenderness, enlargement, or mass  Assessment &  Plan:  There are no diagnoses linked to this encounter.  No follow-ups on file.   Albertine Grates, FNP 2:36 PM

## 2023-01-10 DIAGNOSIS — L680 Hirsutism: Secondary | ICD-10-CM | POA: Insufficient documentation

## 2023-01-12 LAB — TESTOSTERONE,FREE AND TOTAL
Testosterone, Free: 1.1 pg/mL (ref 0.0–4.2)
Testosterone: 29 ng/dL (ref 13–71)

## 2023-01-12 LAB — FSH/LH
FSH: 5.6 m[IU]/mL
LH: 22.1 m[IU]/mL

## 2023-01-12 LAB — PROLACTIN: Prolactin: 15.9 ng/mL (ref 4.8–33.4)

## 2023-01-16 ENCOUNTER — Other Ambulatory Visit: Payer: Self-pay | Admitting: Obstetrics and Gynecology

## 2023-01-16 ENCOUNTER — Ambulatory Visit (HOSPITAL_COMMUNITY)
Admission: RE | Admit: 2023-01-16 | Discharge: 2023-01-16 | Disposition: A | Discharge status: Home / Self Care | Payer: BC Managed Care – PPO | Source: Ambulatory Visit | Attending: Obstetrics and Gynecology | Admitting: Obstetrics and Gynecology

## 2023-01-16 DIAGNOSIS — R102 Pelvic and perineal pain: Secondary | ICD-10-CM | POA: Diagnosis not present

## 2023-01-16 DIAGNOSIS — L680 Hirsutism: Secondary | ICD-10-CM

## 2023-01-16 DIAGNOSIS — Z01419 Encounter for gynecological examination (general) (routine) without abnormal findings: Secondary | ICD-10-CM

## 2023-01-21 LAB — CYTOLOGY - PAP
Comment: NEGATIVE
Diagnosis: NEGATIVE
High risk HPV: NEGATIVE

## 2023-02-12 DIAGNOSIS — L700 Acne vulgaris: Secondary | ICD-10-CM | POA: Diagnosis not present

## 2023-02-12 DIAGNOSIS — L680 Hirsutism: Secondary | ICD-10-CM | POA: Diagnosis not present

## 2023-04-17 NOTE — L&D Delivery Note (Signed)
 Delivery Note Bailey Carney is a 33 y.o. G3P1011 at [redacted]w[redacted]d admitted for preE w/SF.   GBS Status:   negative  Labor course: Initial SVE: 2/70/-3. Augmentation with: AROM, Pitocin , Cytotec , and IP Foley. She then progressed to complete.  ROM: 5h 66m with clear fluid  Birth: After a brief 2nd stage, she delivered a Live born female  Birth Weight:   APGAR: ,   Newborn Delivery   Birth date/time: 03/11/2024 08:04:00 Delivery type:         Called to room by RN. Baby delivered via spontaneous vaginal delivery, baby was born in the bed as the patient was sleeping. CNM and SNM to room immediately as baby was being placed skin to skin. Infant placed directly on mom's abdomen for bonding/skin-to-skin, baby dried and stimulated. Cord clamped x 2 after 1 minute and cut by mother of baby.  Cord blood collected. Placenta delivered-Spontaneous with  . 20u Pitocin  in 500cc LR given as a bolus before delivery of placenta.  Fundus firm with massage. Placenta inspected and appears to be intact with a 3 VC.  Sponge and instrument count were correct x2.  Intrapartum complications:  None Anesthesia:  epidural Lacerations:  none EBL (mL):50 ml    Mom to postpartum.  Baby to Couplet care / Skin to Skin. Placenta to L&D   Plans to Breast and bottlefeed Contraception: Nexplanon Circumcision: N/A  Note sent to [facility] for pp visit.  Delivery Report:  Review the Delivery Report for details.     Signed: Rosina Hamilton, DNP,CNM 03/11/2024, 8:24 AM

## 2023-05-09 ENCOUNTER — Other Ambulatory Visit: Payer: Self-pay

## 2023-05-09 ENCOUNTER — Emergency Department (HOSPITAL_BASED_OUTPATIENT_CLINIC_OR_DEPARTMENT_OTHER): Payer: BC Managed Care – PPO | Admitting: Radiology

## 2023-05-09 ENCOUNTER — Emergency Department (HOSPITAL_BASED_OUTPATIENT_CLINIC_OR_DEPARTMENT_OTHER)
Admission: EM | Admit: 2023-05-09 | Discharge: 2023-05-10 | Disposition: A | Discharge status: Home / Self Care | Payer: BC Managed Care – PPO | Attending: Emergency Medicine | Admitting: Emergency Medicine

## 2023-05-09 DIAGNOSIS — R079 Chest pain, unspecified: Secondary | ICD-10-CM | POA: Diagnosis not present

## 2023-05-09 DIAGNOSIS — R7309 Other abnormal glucose: Secondary | ICD-10-CM | POA: Insufficient documentation

## 2023-05-09 DIAGNOSIS — Z79899 Other long term (current) drug therapy: Secondary | ICD-10-CM | POA: Insufficient documentation

## 2023-05-09 DIAGNOSIS — Z20822 Contact with and (suspected) exposure to covid-19: Secondary | ICD-10-CM | POA: Insufficient documentation

## 2023-05-09 DIAGNOSIS — R0789 Other chest pain: Secondary | ICD-10-CM | POA: Diagnosis not present

## 2023-05-09 DIAGNOSIS — J111 Influenza due to unidentified influenza virus with other respiratory manifestations: Secondary | ICD-10-CM

## 2023-05-09 DIAGNOSIS — R739 Hyperglycemia, unspecified: Secondary | ICD-10-CM

## 2023-05-09 DIAGNOSIS — I10 Essential (primary) hypertension: Secondary | ICD-10-CM | POA: Diagnosis not present

## 2023-05-09 DIAGNOSIS — J45909 Unspecified asthma, uncomplicated: Secondary | ICD-10-CM | POA: Insufficient documentation

## 2023-05-09 DIAGNOSIS — J101 Influenza due to other identified influenza virus with other respiratory manifestations: Secondary | ICD-10-CM | POA: Diagnosis not present

## 2023-05-09 DIAGNOSIS — R110 Nausea: Secondary | ICD-10-CM | POA: Diagnosis not present

## 2023-05-09 DIAGNOSIS — R0781 Pleurodynia: Secondary | ICD-10-CM | POA: Insufficient documentation

## 2023-05-09 LAB — CBC
HCT: 40 % (ref 36.0–46.0)
Hemoglobin: 13.4 g/dL (ref 12.0–15.0)
MCH: 29.9 pg (ref 26.0–34.0)
MCHC: 33.5 g/dL (ref 30.0–36.0)
MCV: 89.3 fL (ref 80.0–100.0)
Platelets: 195 10*3/uL (ref 150–400)
RBC: 4.48 MIL/uL (ref 3.87–5.11)
RDW: 12.9 % (ref 11.5–15.5)
WBC: 15.5 10*3/uL — ABNORMAL HIGH (ref 4.0–10.5)
nRBC: 0 % (ref 0.0–0.2)

## 2023-05-09 LAB — BASIC METABOLIC PANEL
Anion gap: 7 (ref 5–15)
BUN: 16 mg/dL (ref 6–20)
CO2: 28 mmol/L (ref 22–32)
Calcium: 9.2 mg/dL (ref 8.9–10.3)
Chloride: 102 mmol/L (ref 98–111)
Creatinine, Ser: 0.85 mg/dL (ref 0.44–1.00)
GFR, Estimated: >60 mL/min (ref >60)
Glucose, Bld: 107 mg/dL — ABNORMAL HIGH (ref 70–99)
Potassium: 4 mmol/L (ref 3.5–5.1)
Sodium: 137 mmol/L (ref 135–145)

## 2023-05-09 LAB — RESP PANEL BY RT-PCR (RSV, FLU A&B, COVID)  RVPGX2
Influenza A by PCR: NEGATIVE
Influenza B by PCR: NEGATIVE
Resp Syncytial Virus by PCR: NEGATIVE
SARS Coronavirus 2 by RT PCR: NEGATIVE

## 2023-05-09 LAB — TROPONIN I (HIGH SENSITIVITY): Troponin I (High Sensitivity): 3 ng/L (ref <18)

## 2023-05-09 MED ORDER — IBUPROFEN 400 MG PO TABS
400.0000 mg | ORAL_TABLET | Freq: Once | ORAL | Status: AC
  Administered 2023-05-10: 400 mg via ORAL
  Filled 2023-05-09: qty 1

## 2023-05-09 MED ORDER — ACETAMINOPHEN 325 MG PO TABS
650.0000 mg | ORAL_TABLET | Freq: Once | ORAL | Status: AC | PRN
  Administered 2023-05-09: 650 mg via ORAL
  Filled 2023-05-09: qty 2

## 2023-05-09 NOTE — ED Triage Notes (Signed)
Pt POV from home reporting gen body aches, chills and shest pain when taking a deep breath. Denies SOB, no meds taken PTA.

## 2023-05-09 NOTE — ED Provider Notes (Signed)
[place] EMERGENCY DEPARTMENT AT [facility] Provider Note   CSN: [id number] Arrival date & time: 05/09/23  2013     History  Chief Complaint  Patient presents with   Chest Pain   Generalized Body Aches    Bailey Carney is a 33 y.o. female.  The history is provided by the patient.  Chest Pain She has history of hypertension, asthma and comes in because of chest pain and chills.  She has been having some soreness in her chest radiating around to the ribs and back for the last 4 days.  She has also had a sore throat.  Today she started having chills but denies fever or sweats.  There has been some mild nausea but no vomiting.  She denies any coughing.  She denies any sick contacts.  She has not done any treat her symptoms.   Home Medications Prior to Admission medications   Medication Sig Start Date End Date Taking? Authorizing Provider  adapalene (DIFFERIN) 0.1 % cream Apply topically at bedtime.    [provider]  albuterol (PROVENTIL HFA;VENTOLIN HFA) 108 (90 Base) MCG/ACT inhaler Inhale 1-2 puffs into the lungs every 6 (six) hours as needed for wheezing or shortness of breath. 11/14/17   Burleson, Brand Males, NP  amLODipine (NORVASC) 10 MG tablet Take by mouth. 06/20/20   [provider]  clindamycin (CLEOCIN T) 1 % external solution Apply topically 2 (two) times daily.    [provider]  doxycycline (VIBRA-TABS) 100 MG tablet Take 100 mg by mouth 2 (two) times daily.    [provider]  spironolactone (ALDACTONE) 50 MG tablet Take 1 tablet (50 mg total) by mouth 2 (two) times daily. Will start with 50 mg twice daily 01/09/23   Sue Lush, FNP      Allergies    Amoxicillin    Review of Systems   Review of Systems  Cardiovascular:  Positive for chest pain.  All other systems reviewed and are negative.   Physical Exam Updated Vital Signs BP (!) 146/82 (BP Location: Right Arm)   Pulse (!) 115   Temp (!) 100.7 F (38.2 C)    Resp 16   Ht 5\' 7"  (1.702 m)   Wt 99.8 kg   LMP 04/23/2023 (Exact Date)   SpO2 100%   BMI 34.46 kg/m  Physical Exam Vitals and nursing note reviewed.   33 year old female, resting comfortably and in no acute distress. Vital signs are significant for elevated temperature, blood pressure, heart rate. Oxygen saturation is 100%, which is normal. Head is normocephalic and atraumatic. PERRLA, EOMI. Oropharynx is mildly erythematous without exudate.  There is no pooling of secretions and phonation is normal. Neck is nontender and supple without adenopathy. Back is nontender. Lungs are clear without rales, wheezes, or rhonchi. Chest is nontender. Heart has regular rate and rhythm without murmur. Abdomen is soft, flat, nontender. Extremities have no cyanosis or edema, full range of motion is present. Skin is warm and dry without rash. Neurologic: Mental status is normal, cranial nerves are intact, moves all extremities equally.  ED Results / Procedures / Treatments   Labs (all labs ordered are listed, but only abnormal results are displayed) Labs Reviewed  BASIC METABOLIC PANEL - Abnormal; Notable for the following components:      Result Value   Glucose, Bld 107 (*)    All other components within normal limits  CBC - Abnormal; Notable for the following components:   WBC 15.5 (*)  All other components within normal limits  RESP PANEL BY RT-PCR (RSV, FLU A&B, COVID)  RVPGX2  GROUP A STREP BY PCR  PREGNANCY, URINE  TROPONIN I (HIGH SENSITIVITY)    EKG EKG Interpretation Date/Time:  Thursday May 09 2023 20:26:00 EST Ventricular Rate:  106 PR Interval:  168 QRS Duration:  92 QT Interval:  312 QTC Calculation: 414 R Axis:   71  Text Interpretation: Sinus tachycardia Right atrial enlargement Incomplete right bundle branch block Borderline ECG When compared with ECG of 14-Nov-2017 20:17, Nonspecific T wave abnormality now evident in Lateral leads Confirmed by Dione Booze  ([id number]) on 05/09/2023 11:44:53 PM  Radiology DG Chest 2 View Result Date: 05/09/2023 CLINICAL DATA:  Chest pain EXAM: CHEST - 2 VIEW COMPARISON:  None Available. FINDINGS: The heart size and mediastinal contours are within normal limits. Both lungs are clear. The visualized skeletal structures are unremarkable. IMPRESSION: No active cardiopulmonary disease. Electronically Signed   By: Helyn Numbers M.D.   On: 05/09/2023 20:55    Procedures Procedures    Medications Ordered in ED Medications  acetaminophen (TYLENOL) tablet 650 mg (650 mg Oral Given 05/09/23 2023)  ibuprofen (ADVIL) tablet 400 mg (400 mg Oral Given 05/10/23 0015)  dexamethasone (DECADRON) tablet 10 mg (10 mg Oral Given 05/10/23 0130)    ED Course/ Medical Decision Making/ A&P                                 Medical Decision Making Amount and/or Complexity of Data Reviewed Labs: ordered. Radiology: ordered.  Risk OTC drugs. Prescription drug management.   Fever, chills, body aches suggesting viral illness.  Sore throat and erythema of the pharynx suggest possible streptococcal infection.  Chest x-ray shows no evidence of pneumonia.  Have independently viewed the images, and agree with the radiologist's interpretation.  I have reviewed her electrocardiogram, my interpretation is sinus tachycardia, incomplete right bundle branch block with nonspecific T wave changes.  I have reviewed her laboratory tests, and my interpretation is elevated WBC which is nonspecific, normal troponin, mildly elevated random glucose which will need to be followed as an outpatient, negative PCR for COVID-19 and influenza and RSV.  I have ordered a PCR test for strep.  She had been given acetaminophen at triage with no improvement, I have ordered a dose of ibuprofen.  She feels somewhat better following ibuprofen.  Strep PCR is negative, no evidence of streptococcal infection.  I have ordered a single dose of dexamethasone.  I am discharging her  with instructions to use acetaminophen and ibuprofen as needed for fever or pain, drink plenty of fluids, return if symptoms are worsening.  Final Clinical Impression(s) / ED Diagnoses Final diagnoses:  Influenza-like illness  Elevated random blood glucose level    Rx / DC Orders ED Discharge Orders     None         Dione Booze, MD 05/10/23 [phone]

## 2023-05-10 DIAGNOSIS — J029 Acute pharyngitis, unspecified: Secondary | ICD-10-CM | POA: Diagnosis not present

## 2023-05-10 DIAGNOSIS — R070 Pain in throat: Secondary | ICD-10-CM | POA: Diagnosis not present

## 2023-05-10 DIAGNOSIS — Z7251 High risk heterosexual behavior: Secondary | ICD-10-CM | POA: Diagnosis not present

## 2023-05-10 DIAGNOSIS — Z114 Encounter for screening for human immunodeficiency virus [HIV]: Secondary | ICD-10-CM | POA: Diagnosis not present

## 2023-05-10 DIAGNOSIS — Z3200 Encounter for pregnancy test, result unknown: Secondary | ICD-10-CM | POA: Diagnosis not present

## 2023-05-10 DIAGNOSIS — N898 Other specified noninflammatory disorders of vagina: Secondary | ICD-10-CM | POA: Diagnosis not present

## 2023-05-10 DIAGNOSIS — R300 Dysuria: Secondary | ICD-10-CM | POA: Diagnosis not present

## 2023-05-10 LAB — PREGNANCY, URINE: Preg Test, Ur: NEGATIVE

## 2023-05-10 LAB — GROUP A STREP BY PCR: Group A Strep by PCR: NOT DETECTED

## 2023-05-10 MED ORDER — DEXAMETHASONE 4 MG PO TABS
10.0000 mg | ORAL_TABLET | Freq: Once | ORAL | Status: AC
  Administered 2023-05-10: 10 mg via ORAL
  Filled 2023-05-10: qty 3

## 2023-05-10 NOTE — ED Notes (Signed)
Ok per MD to discontinue second trop.

## 2023-05-10 NOTE — Discharge Instructions (Addendum)
Rest.  Drink plenty of fluids.  Take ibuprofen and/or acetaminophen as needed for fever or aching.  Please be aware that if you combine ibuprofen and acetaminophen, you will get better pain and fever control then you get from taking either medication by itself.

## 2023-05-10 NOTE — ED Notes (Addendum)
Pt requested PIV be removed. Pt states PIV is irritating. IV flushes good, good blood return. Explained to pt importance of leaving PIV in place for potential medications. Pt states she still wants the PIV removed and if she needs one we can start another one. PIV removed per pt request.

## 2023-07-24 ENCOUNTER — Inpatient Hospital Stay (HOSPITAL_COMMUNITY)
Admission: AD | Admit: 2023-07-24 | Discharge: 2023-07-24 | Disposition: A | Discharge status: Home / Self Care | Attending: Obstetrics and Gynecology | Admitting: Obstetrics and Gynecology

## 2023-07-24 ENCOUNTER — Inpatient Hospital Stay (HOSPITAL_COMMUNITY)

## 2023-07-24 ENCOUNTER — Encounter (HOSPITAL_COMMUNITY): Payer: Self-pay | Admitting: *Deleted

## 2023-07-24 ENCOUNTER — Other Ambulatory Visit: Payer: Self-pay

## 2023-07-24 DIAGNOSIS — Z3A01 Less than 8 weeks gestation of pregnancy: Secondary | ICD-10-CM | POA: Diagnosis not present

## 2023-07-24 DIAGNOSIS — R7989 Other specified abnormal findings of blood chemistry: Secondary | ICD-10-CM | POA: Diagnosis not present

## 2023-07-24 DIAGNOSIS — R252 Cramp and spasm: Secondary | ICD-10-CM | POA: Diagnosis not present

## 2023-07-24 DIAGNOSIS — R109 Unspecified abdominal pain: Secondary | ICD-10-CM

## 2023-07-24 DIAGNOSIS — O26899 Other specified pregnancy related conditions, unspecified trimester: Secondary | ICD-10-CM

## 2023-07-24 DIAGNOSIS — O26891 Other specified pregnancy related conditions, first trimester: Secondary | ICD-10-CM | POA: Insufficient documentation

## 2023-07-24 DIAGNOSIS — Z3A00 Weeks of gestation of pregnancy not specified: Secondary | ICD-10-CM

## 2023-07-24 DIAGNOSIS — M545 Low back pain, unspecified: Secondary | ICD-10-CM

## 2023-07-24 DIAGNOSIS — O3680X0 Pregnancy with inconclusive fetal viability, not applicable or unspecified: Secondary | ICD-10-CM | POA: Diagnosis not present

## 2023-07-24 LAB — CBC
HCT: 41.6 % (ref 36.0–46.0)
Hemoglobin: 13.9 g/dL (ref 12.0–15.0)
MCH: 29 pg (ref 26.0–34.0)
MCHC: 33.4 g/dL (ref 30.0–36.0)
MCV: 86.7 fL (ref 80.0–100.0)
Platelets: 204 10*3/uL (ref 150–400)
RBC: 4.8 MIL/uL (ref 3.87–5.11)
RDW: 12.8 % (ref 11.5–15.5)
WBC: 7.6 10*3/uL (ref 4.0–10.5)
nRBC: 0 % (ref 0.0–0.2)

## 2023-07-24 LAB — URINALYSIS, ROUTINE W REFLEX MICROSCOPIC
Bilirubin Urine: NEGATIVE
Glucose, UA: NEGATIVE mg/dL
Hgb urine dipstick: NEGATIVE
Ketones, ur: NEGATIVE mg/dL
Nitrite: NEGATIVE
Protein, ur: NEGATIVE mg/dL
Specific Gravity, Urine: 1.023 (ref 1.005–1.030)
pH: 5 (ref 5.0–8.0)

## 2023-07-24 LAB — HCG, QUANTITATIVE, PREGNANCY: hCG, Beta Chain, Quant, S: 50 m[IU]/mL — ABNORMAL HIGH (ref <5)

## 2023-07-24 LAB — POCT PREGNANCY, URINE: Preg Test, Ur: POSITIVE — AB

## 2023-07-24 NOTE — MAU Provider Note (Signed)
 S [name] is a 33 y.o. [phone] patient who presents to MAU today with complaint of recently found out she was pregnant with a positive UPT on 07/21/23. She reports she has been having lower abdominal cramping and back pain that she rates 6-7/10 on pain scale. She also reports she was recently seen at [facility] and had STI testing performed and was treated for BV and yeast which she took the medication for and therefore does not want to self swab again.   She denies any VB but reports she had noticed some pinkish spotting after using the BR when wiping., Denies LOF and her LMP was ~ 06/19/23. She has not taken anything today for her lower back pain or cramping and declined offered medication of Tylenol/ Flexeril  Review of Systems  All other systems reviewed and are negative.   Unless otherwise noted in HPI  O BP (!) 142/86 (BP Location: Right Arm)   Pulse 79   Temp 98.4 F (36.9 C) (Oral)   Resp 19   Wt 102.4 kg   LMP 06/19/2023 (Approximate)   SpO2 98%   BMI 35.37 kg/m  Physical Exam Vitals and nursing note reviewed.  Constitutional:      General: She is not in acute distress.    Appearance: She is well-developed. She is obese. She is not ill-appearing.  HENT:     Head: Normocephalic.  Cardiovascular:     Rate and Rhythm: Normal rate and regular rhythm.  Pulmonary:     Effort: Pulmonary effort is normal.     Breath sounds: Normal breath sounds.  Abdominal:     Palpations: Abdomen is soft.     Tenderness: There is no guarding or rebound.  Skin:    General: Skin is warm.  Neurological:     Mental Status: She is alert and oriented to person, place, and time.  Psychiatric:        Mood and Affect: Mood normal.        Behavior: Behavior normal.     Orders Placed This Encounter  Procedures   US OB LESS THAN 14 WEEKS WITH OB TRANSVAGINAL    Standing Status:   Standing    Number of Occurrences:   1    Symptom/Reason for Exam:   Vaginal bleeding [[id number]]    Urinalysis, Routine w reflex microscopic -Urine, Clean Catch    Standing Status:   Standing    Number of Occurrences:   1    Specimen Source:   Urine, Clean Catch [76]   CBC    Standing Status:   Standing    Number of Occurrences:   1   hCG, quantitative, pregnancy    Standing Status:   Standing    Number of Occurrences:   1   Pregnancy, urine POC    Standing Status:   Standing    Number of Occurrences:   1   ABO/Rh    Standing Status:   Standing    Number of Occurrences:   1   Discharge patient Discharge disposition: 01-Home or Self Care; Discharge patient date: 07/24/2023    Standing Status:   Standing    Number of Occurrences:   1    Discharge disposition:   01-Home or Self Care [1]    Discharge patient date:   07/24/2023      Results for orders placed or performed during the hospital encounter of 07/24/23 (from the past 24 hours)  Pregnancy, urine POC  Status: Abnormal   Collection Time: 07/24/23 10:30 AM  Result Value Ref Range   Preg Test, Ur POSITIVE (A) NEGATIVE  Urinalysis, Routine w reflex microscopic -Urine, Clean Catch     Status: Abnormal   Collection Time: 07/24/23 10:37 AM  Result Value Ref Range   Color, Urine YELLOW YELLOW   APPearance HAZY (A) CLEAR   Specific Gravity, Urine 1.023 1.005 - 1.030   pH 5.0 5.0 - 8.0   Glucose, UA NEGATIVE NEGATIVE mg/dL   Hgb urine dipstick NEGATIVE NEGATIVE   Bilirubin Urine NEGATIVE NEGATIVE   Ketones, ur NEGATIVE NEGATIVE mg/dL   Protein, ur NEGATIVE NEGATIVE mg/dL   Nitrite NEGATIVE NEGATIVE   Leukocytes,Ua MODERATE (A) NEGATIVE   RBC / HPF 0-5 0 - 5 RBC/hpf   WBC, UA 0-5 0 - 5 WBC/hpf   Bacteria, UA FEW (A) NONE SEEN   Squamous Epithelial / HPF 11-20 0 - 5 /HPF   Mucus PRESENT   ABO/Rh     Status: None (Preliminary result)   Collection Time: 07/24/23 11:22 AM  Result Value Ref Range   ABO/RH(D) PENDING   CBC     Status: None   Collection Time: 07/24/23 11:23 AM  Result Value Ref Range   WBC 7.6 4.0 - 10.5 K/uL    RBC 4.80 3.87 - 5.11 MIL/uL   Hemoglobin 13.9 12.0 - 15.0 g/dL   HCT 16.1 09.6 - 04.5 %   MCV 86.7 80.0 - 100.0 fL   MCH 29.0 26.0 - 34.0 pg   MCHC 33.4 30.0 - 36.0 g/dL   RDW 40.9 81.1 - 91.4 %   Platelets 204 150 - 400 K/uL   nRBC 0.0 0.0 - 0.2 %  hCG, quantitative, pregnancy     Status: Abnormal   Collection Time: 07/24/23 11:23 AM  Result Value Ref Range   hCG, Beta Chain, Quant, S 50 (H) <5 mIU/mL      MDM - HIGH  I have reviewed the patient chart and performed the physical exam . I have ordered & interpreted the lab results and reviewed and interpreted the ultrasound images Medications ordered as stated below.  A/P as described below.  Counseling and education provided and patient agreeable  with plan as described below. Verbalized understanding.    ASSESSMENT Medical screening exam complete   1. Pregnancy of unknown anatomic location  2. Elevated serum hCG (Primary)  3. Low back pain, unspecified back pain laterality, unspecified chronicity, unspecified whether sciatica present  4. Cramping affecting pregnancy, antepartum    PLAN Future Appointments  Date Time Provider Department Center  07/26/2023 10:00 AM [facility]    Discharge from MAU in stable condition See AVS for full description of educational information and instructions provided to the patient at time of discharge Patient given the option of transfer to [facility] for further evaluation or seek care in outpatient facility of choice  List of options for follow-up given  Warning signs for worsening condition that would warrant emergency follow-up discussed Patient may return to MAU as needed   Colman Cater, NP 07/24/2023 12:43 PM  for

## 2023-07-24 NOTE — MAU Note (Signed)
 Bailey Carney is a 33 y.o. at Unknown here in MAU reporting: she's having both lower abdominal pain/cramping and back pain that began 1 week ago.  Denies current VB, had spotting 1.5-2 weeks ago.    LMP: 06/19/2023 approximate Onset of complaint: last week  Pain score: 7 Vitals:   07/24/23 1035  BP: (!) 142/86  Pulse: 79  Resp: 19  Temp: 98.4 F (36.9 C)  SpO2: 98%     FHT: NA  Lab orders placed from triage: UPT & UA

## 2023-07-25 LAB — ABO/RH: ABO/RH(D): B POS

## 2023-07-26 ENCOUNTER — Other Ambulatory Visit: Payer: Self-pay

## 2023-07-26 DIAGNOSIS — O3680X0 Pregnancy with inconclusive fetal viability, not applicable or unspecified: Secondary | ICD-10-CM

## 2023-07-27 LAB — BETA HCG QUANT (REF LAB): hCG Quant: 108 m[IU]/mL

## 2023-07-30 ENCOUNTER — Other Ambulatory Visit

## 2023-07-30 DIAGNOSIS — O3680X0 Pregnancy with inconclusive fetal viability, not applicable or unspecified: Secondary | ICD-10-CM

## 2023-07-31 ENCOUNTER — Encounter: Payer: Self-pay | Admitting: Obstetrics and Gynecology

## 2023-07-31 LAB — BETA HCG QUANT (REF LAB): hCG Quant: 916 m[IU]/mL

## 2023-08-01 ENCOUNTER — Encounter: Payer: Self-pay | Admitting: Family Medicine

## 2023-08-04 ENCOUNTER — Emergency Department (HOSPITAL_BASED_OUTPATIENT_CLINIC_OR_DEPARTMENT_OTHER)
Admission: EM | Admit: 2023-08-04 | Discharge: 2023-08-05 | Disposition: A | Discharge status: Home / Self Care | Attending: Emergency Medicine | Admitting: Emergency Medicine

## 2023-08-04 ENCOUNTER — Other Ambulatory Visit: Payer: Self-pay

## 2023-08-04 ENCOUNTER — Inpatient Hospital Stay (HOSPITAL_COMMUNITY)
Admission: AD | Admit: 2023-08-04 | Discharge: 2023-08-04 | Disposition: A | Discharge status: Home / Self Care | Source: Home / Self Care | Attending: Obstetrics and Gynecology | Admitting: Obstetrics and Gynecology

## 2023-08-04 ENCOUNTER — Encounter (HOSPITAL_BASED_OUTPATIENT_CLINIC_OR_DEPARTMENT_OTHER): Payer: Self-pay | Admitting: Emergency Medicine

## 2023-08-04 DIAGNOSIS — Z5321 Procedure and treatment not carried out due to patient leaving prior to being seen by health care provider: Secondary | ICD-10-CM | POA: Insufficient documentation

## 2023-08-04 DIAGNOSIS — O23591 Infection of other part of genital tract in pregnancy, first trimester: Secondary | ICD-10-CM | POA: Insufficient documentation

## 2023-08-04 DIAGNOSIS — B9689 Other specified bacterial agents as the cause of diseases classified elsewhere: Secondary | ICD-10-CM | POA: Diagnosis not present

## 2023-08-04 DIAGNOSIS — Z3A01 Less than 8 weeks gestation of pregnancy: Secondary | ICD-10-CM | POA: Insufficient documentation

## 2023-08-04 DIAGNOSIS — O26891 Other specified pregnancy related conditions, first trimester: Secondary | ICD-10-CM | POA: Diagnosis not present

## 2023-08-04 DIAGNOSIS — Z79899 Other long term (current) drug therapy: Secondary | ICD-10-CM | POA: Diagnosis not present

## 2023-08-04 LAB — CBC WITH DIFFERENTIAL/PLATELET
Abs Immature Granulocytes: 0.01 10*3/uL (ref 0.00–0.07)
Basophils Absolute: 0 10*3/uL (ref 0.0–0.1)
Basophils Relative: 0 %
Eosinophils Absolute: 0 10*3/uL (ref 0.0–0.5)
Eosinophils Relative: 0 %
HCT: 36.3 % (ref 36.0–46.0)
Hemoglobin: 12.3 g/dL (ref 12.0–15.0)
Immature Granulocytes: 0 %
Lymphocytes Relative: 36 %
Lymphs Abs: 3.3 10*3/uL (ref 0.7–4.0)
MCH: 29.4 pg (ref 26.0–34.0)
MCHC: 33.9 g/dL (ref 30.0–36.0)
MCV: 86.8 fL (ref 80.0–100.0)
Monocytes Absolute: 0.7 10*3/uL (ref 0.1–1.0)
Monocytes Relative: 7 %
Neutro Abs: 5.1 10*3/uL (ref 1.7–7.7)
Neutrophils Relative %: 57 %
Platelets: 203 10*3/uL (ref 150–400)
RBC: 4.18 MIL/uL (ref 3.87–5.11)
RDW: 12.4 % (ref 11.5–15.5)
WBC: 9.2 10*3/uL (ref 4.0–10.5)
nRBC: 0 % (ref 0.0–0.2)

## 2023-08-04 LAB — URINALYSIS, ROUTINE W REFLEX MICROSCOPIC
Bilirubin Urine: NEGATIVE
Bilirubin Urine: NEGATIVE
Glucose, UA: NEGATIVE mg/dL
Glucose, UA: NEGATIVE mg/dL
Hgb urine dipstick: NEGATIVE
Hgb urine dipstick: NEGATIVE
Ketones, ur: NEGATIVE mg/dL
Ketones, ur: NEGATIVE mg/dL
Leukocytes,Ua: NEGATIVE
Leukocytes,Ua: NEGATIVE
Nitrite: NEGATIVE
Nitrite: NEGATIVE
Protein, ur: NEGATIVE mg/dL
Specific Gravity, Urine: 1.021 (ref 1.005–1.030)
Specific Gravity, Urine: 1.03 (ref 1.005–1.030)
pH: 7 (ref 5.0–8.0)
pH: 8 (ref 5.0–8.0)

## 2023-08-04 LAB — WET PREP, GENITAL
Clue Cells Wet Prep HPF POC: NONE SEEN
Sperm: NONE SEEN
Trich, Wet Prep: NONE SEEN
WBC, Wet Prep HPF POC: <10 (ref <10)
Yeast Wet Prep HPF POC: NONE SEEN

## 2023-08-04 LAB — PREGNANCY, URINE: Preg Test, Ur: POSITIVE — AB

## 2023-08-04 MED ORDER — ACETAMINOPHEN 500 MG PO TABS
1000.0000 mg | ORAL_TABLET | Freq: Once | ORAL | Status: AC
  Administered 2023-08-05: 1000 mg via ORAL
  Filled 2023-08-04: qty 2

## 2023-08-04 NOTE — MAU Note (Addendum)
 Bailey Carney is a 33 y.o. at [redacted]w[redacted]d here in MAU reporting: Pt states she has been having lower back pain for the past month. Pt states she was seen on 4/9 for the pain but it has continued. Pt states the back pain is constant and feels like a sharp pain right now but it can be dull at times. Pt states she has had pain in her right side for the past few days. Pt describes the pain in her side as aching and it comes and goes. Pt states she has some discomfort in her vagina as well. Pt denies VB. Pt states she has had creamy watery white discharge that has been occurring for 2-3 days that is not normal for her.    Onset of complaint: beginning of April Pain score: 9/10 lower back  5/10 right side pain  7/10 vagina  Vitals:   08/04/23 2001  BP: 121/66  Pulse: 61  Resp: 20  Temp: 98.3 F (36.8 C)  SpO2: 100%     FHT: N/A Lab orders placed from triage:  UA

## 2023-08-04 NOTE — MAU Provider Note (Signed)
 Patient left AMA prior to being seen by a Provider.   Shady Bradish Maurie Southern) Marlys Singh, MSN, CNM  Center for [facility]  08/04/2023 10:02 PM

## 2023-08-04 NOTE — ED Triage Notes (Signed)
 Pain pain x a couple of weeks Abdo cramping into legs today  Some vaginal discharge, white watery. Denies bleeding  Pregnant [redacted] weeks  Seen at MAU left AMA per notes

## 2023-08-04 NOTE — ED Notes (Signed)
 Pt self swapped-wet prep

## 2023-08-04 NOTE — ED Provider Notes (Signed)
 [place] EMERGENCY DEPARTMENT AT [facility] Provider Note   CSN: [id number] Arrival date & time: 08/04/23  2228     History {Add pertinent medical, surgical, social history, OB history to HPI:1} Chief Complaint  Patient presents with   Abdominal Cramping    Bailey Carney is a 33 y.o. female.  33 year old female presents ER today secondary to left lower quad abdominal pain and midline back pain it has been going on for the last few days.  The back pain is more chronic but the abdominal pain is relatively new.  She is approximate 6 weeks by last menstrual however no confirmatory ultrasounds been able to be completed yet.  Patient with some mild vaginal discharge for the last few days but no bleeding.  To MAU earlier but left without being seen.   Abdominal Cramping       Home Medications Prior to Admission medications   Medication Sig Start Date End Date Taking? Authorizing Provider  adapalene (DIFFERIN) 0.1 % cream Apply topically at bedtime.    [provider]  albuterol  (PROVENTIL  HFA;VENTOLIN  HFA) 108 (90 Base) MCG/ACT inhaler Inhale 1-2 puffs into the lungs every 6 (six) hours as needed for wheezing or shortness of breath. 11/14/17   Burleson, Saunders Curio, NP  amLODipine  (NORVASC ) 10 MG tablet Take by mouth. 06/20/20   [provider]  clindamycin (CLEOCIN T) 1 % external solution Apply topically 2 (two) times daily.    [provider]  doxycycline (VIBRA-TABS) 100 MG tablet Take 100 mg by mouth 2 (two) times daily.    [provider]  spironolactone  (ALDACTONE ) 50 MG tablet Take 1 tablet (50 mg total) by mouth 2 (two) times daily. Will start with 50 mg twice daily 01/09/23   Zelma Hidden, FNP      Allergies    Amoxicillin    Review of Systems   Review of Systems  Physical Exam Updated Vital Signs BP 130/86 (BP Location: Right Arm)   Pulse 65   Temp 98.5 F (36.9 C)   Resp 18   LMP 06/19/2023 (Approximate)   SpO2 99%   Physical Exam Vitals and nursing note reviewed.  Constitutional:      Appearance: She is well-developed.  HENT:     Head: Normocephalic and atraumatic.  Eyes:     Pupils: Pupils are equal, round, and reactive to light.  Cardiovascular:     Rate and Rhythm: Normal rate and regular rhythm.  Pulmonary:     Effort: No respiratory distress.     Breath sounds: No stridor.  Abdominal:     General: There is no distension.  Genitourinary:    Comments: Recommended pelvic for swabs, patient deferred to self swab as she has done that before Musculoskeletal:     Cervical back: Normal range of motion.  Skin:    General: Skin is warm and dry.  Neurological:     General: No focal deficit present.     Mental Status: She is alert.     ED Results / Procedures / Treatments   Labs (all labs ordered are listed, but only abnormal results are displayed) Labs Reviewed  URINALYSIS, ROUTINE W REFLEX MICROSCOPIC - Abnormal; Notable for the following components:      Result Value   Protein, ur TRACE (*)    All other components within normal limits  PREGNANCY, URINE - Abnormal; Notable for the following components:   Preg Test, Ur POSITIVE (*)    All other components within normal  limits  WET PREP, GENITAL  CBC WITH DIFFERENTIAL/PLATELET  HCG, QUANTITATIVE, PREGNANCY  COMPREHENSIVE METABOLIC PANEL WITH GFR  GC/CHLAMYDIA PROBE AMP ([place]) NOT AT [facility]    EKG None  Radiology No results found.  Procedures Ultrasound ED OB Pelvic  Date/Time: 08/04/2023 11:58 PM  Performed by: Eve Hinders, MD Authorized by: Eve Hinders, MD   Procedure details:    Indications: evaluate for IUP and pregnant with abdominal pain     Assess:  Fetal viability, EGA and intrauterine pregnancy   Technique:  Transabdominal obstetric (HCG+) exam   Images: archived   Study Limitations: body habitus and bowel gas Uterine findings:    Endometrial stripe: not identified     Intrauterine pregnancy: not  identified     Single gestation: not identified     Gestational sac: not identified     Yolk sac: not identified     Fetal pole: not identified     Fetal heart rate: not identified      Left ovary findings:    Left ovary:  Not visualized    Right ovary findings:     Right ovary:  Not visualized    Other findings:    Free pelvic fluid: not identified     Free peritoneal fluid: not identified   Comments:     Did not confidently find IUP or ovaries. Did not see free fluid.      Medications Ordered in ED Medications - No data to display  ED Course/ Medical Decision Making/ A&P                                 Medical Decision Making Amount and/or Complexity of Data Reviewed Labs: ordered.  Will need f/u US  for confirmation of pregnancy of HCG is appropriate.  ***  {Document critical care time when appropriate:1} {Document review of labs and clinical decision tools ie heart score, Chads2Vasc2 etc:1}  {Document your independent review of radiology images, and any outside records:1} {Document your discussion with family members, caretakers, and with consultants:1} {Document social determinants of health affecting pt's care:1} {Document your decision making why or why not admission, treatments were needed:1} Final Clinical Impression(s) / ED Diagnoses Final diagnoses:  None    Rx / DC Orders ED Discharge Orders     None

## 2023-08-04 NOTE — MAU Note (Signed)
 Registration states Bailey Carney left AMA. Registration states they had her sign the AMA form but the pt took the form with her when she left.

## 2023-08-05 ENCOUNTER — Emergency Department (HOSPITAL_BASED_OUTPATIENT_CLINIC_OR_DEPARTMENT_OTHER)

## 2023-08-05 DIAGNOSIS — O23591 Infection of other part of genital tract in pregnancy, first trimester: Secondary | ICD-10-CM | POA: Diagnosis not present

## 2023-08-05 DIAGNOSIS — O26891 Other specified pregnancy related conditions, first trimester: Secondary | ICD-10-CM | POA: Diagnosis not present

## 2023-08-05 DIAGNOSIS — Z3A01 Less than 8 weeks gestation of pregnancy: Secondary | ICD-10-CM | POA: Diagnosis not present

## 2023-08-05 LAB — GC/CHLAMYDIA PROBE AMP (~~LOC~~) NOT AT ARMC
Chlamydia: NEGATIVE
Chlamydia: NEGATIVE
Comment: NEGATIVE
Comment: NEGATIVE
Comment: NORMAL
Comment: NORMAL
Neisseria Gonorrhea: NEGATIVE
Neisseria Gonorrhea: NEGATIVE

## 2023-08-05 LAB — COMPREHENSIVE METABOLIC PANEL WITH GFR
ALT: 10 U/L (ref 0–44)
AST: 14 U/L — ABNORMAL LOW (ref 15–41)
Albumin: 4.1 g/dL (ref 3.5–5.0)
Alkaline Phosphatase: 37 U/L — ABNORMAL LOW (ref 38–126)
Anion gap: 6 (ref 5–15)
BUN: 12 mg/dL (ref 6–20)
CO2: 26 mmol/L (ref 22–32)
Calcium: 9 mg/dL (ref 8.9–10.3)
Chloride: 104 mmol/L (ref 98–111)
Creatinine, Ser: 0.69 mg/dL (ref 0.44–1.00)
GFR, Estimated: >60 mL/min (ref >60)
Glucose, Bld: 88 mg/dL (ref 70–99)
Potassium: 3.7 mmol/L (ref 3.5–5.1)
Sodium: 136 mmol/L (ref 135–145)
Total Bilirubin: 0.4 mg/dL (ref 0.0–1.2)
Total Protein: 6.2 g/dL — ABNORMAL LOW (ref 6.5–8.1)

## 2023-08-05 LAB — WET PREP, GENITAL
Sperm: NONE SEEN
Trich, Wet Prep: NONE SEEN
WBC, Wet Prep HPF POC: <10 (ref <10)
Yeast Wet Prep HPF POC: NONE SEEN

## 2023-08-05 LAB — HCG, QUANTITATIVE, PREGNANCY: hCG, Beta Chain, Quant, S: 8648 m[IU]/mL — ABNORMAL HIGH (ref <5)

## 2023-08-05 MED ORDER — ACETAMINOPHEN 325 MG PO TABS: 650.0000 mg | ORAL_TABLET | Freq: Four times a day (QID) | ORAL | 0 refills | Status: DC | PRN

## 2023-08-05 MED ORDER — METRONIDAZOLE 500 MG PO TABS: 500.0000 mg | ORAL_TABLET | Freq: Two times a day (BID) | ORAL | 0 refills | Status: DC

## 2023-08-05 MED ORDER — PRENATAL VITAMIN 27-0.8 MG PO TABS: 1.0000 | ORAL_TABLET | Freq: Every day | ORAL | 6 refills | Status: DC

## 2023-08-05 MED ORDER — FLUCONAZOLE 150 MG PO TABS: 150.0000 mg | ORAL_TABLET | ORAL | 0 refills | Status: DC | PRN

## 2023-08-05 NOTE — ED Notes (Signed)
 Patient transported to Ultrasound

## 2023-08-05 NOTE — ED Notes (Signed)
 Patient is resting comfortably.

## 2023-08-05 NOTE — ED Notes (Signed)
 PT given blanket and snacks while waiting.

## 2023-08-06 ENCOUNTER — Ambulatory Visit (HOSPITAL_BASED_OUTPATIENT_CLINIC_OR_DEPARTMENT_OTHER): Admit: 2023-08-06

## 2023-08-21 ENCOUNTER — Other Ambulatory Visit: Payer: Self-pay | Admitting: *Deleted

## 2023-08-22 ENCOUNTER — Other Ambulatory Visit (HOSPITAL_COMMUNITY)
Admission: RE | Admit: 2023-08-22 | Discharge: 2023-08-22 | Disposition: A | Discharge status: Home / Self Care | Source: Ambulatory Visit | Attending: Obstetrics & Gynecology | Admitting: Obstetrics & Gynecology

## 2023-08-22 ENCOUNTER — Other Ambulatory Visit

## 2023-08-22 ENCOUNTER — Ambulatory Visit: Admitting: *Deleted

## 2023-08-22 ENCOUNTER — Other Ambulatory Visit (INDEPENDENT_AMBULATORY_CARE_PROVIDER_SITE_OTHER): Payer: Self-pay

## 2023-08-22 VITALS — BP 128/82 | HR 68 | Wt 229.0 lb

## 2023-08-22 DIAGNOSIS — Z1339 Encounter for screening examination for other mental health and behavioral disorders: Secondary | ICD-10-CM

## 2023-08-22 DIAGNOSIS — Z3480 Encounter for supervision of other normal pregnancy, unspecified trimester: Secondary | ICD-10-CM | POA: Insufficient documentation

## 2023-08-22 DIAGNOSIS — Z3A01 Less than 8 weeks gestation of pregnancy: Secondary | ICD-10-CM

## 2023-08-22 DIAGNOSIS — Z3A00 Weeks of gestation of pregnancy not specified: Secondary | ICD-10-CM | POA: Insufficient documentation

## 2023-08-22 DIAGNOSIS — O3680X0 Pregnancy with inconclusive fetal viability, not applicable or unspecified: Secondary | ICD-10-CM | POA: Diagnosis not present

## 2023-08-22 DIAGNOSIS — O23591 Infection of other part of genital tract in pregnancy, first trimester: Secondary | ICD-10-CM | POA: Diagnosis not present

## 2023-08-22 DIAGNOSIS — B9689 Other specified bacterial agents as the cause of diseases classified elsewhere: Secondary | ICD-10-CM | POA: Insufficient documentation

## 2023-08-22 DIAGNOSIS — Z113 Encounter for screening for infections with a predominantly sexual mode of transmission: Secondary | ICD-10-CM | POA: Diagnosis not present

## 2023-08-22 DIAGNOSIS — Z3481 Encounter for supervision of other normal pregnancy, first trimester: Secondary | ICD-10-CM

## 2023-08-22 MED ORDER — BLOOD PRESSURE KIT DEVI: 1.0000 | 0 refills | Status: DC

## 2023-08-22 MED ORDER — AMLODIPINE BESYLATE 10 MG PO TABS: 10.0000 mg | ORAL_TABLET | Freq: Every day | ORAL | 6 refills | Status: DC

## 2023-08-22 NOTE — Progress Notes (Signed)
 New OB Intake  I connected with Bailey Carney  on 08/22/23 at  9:15 AM EDT by In Person Visit and verified that I am speaking with the correct person using two identifiers. Nurse is located at [facility] and pt is located at [facility].  I discussed the limitations, risks, security and privacy concerns of performing an evaluation and management service by telephone and the availability of in person appointments. I also discussed with the patient that there may be a patient responsible charge related to this service. The patient expressed understanding and agreed to proceed.  I explained I am completing New OB Intake today. We discussed EDD of 03/25/2024, by Last Menstrual Period. Pt is G3P1011. I reviewed her allergies, medications and Medical/Surgical/OB history.    Patient Active Problem List   Diagnosis Date Noted   Hirsutism 01/10/2023   Paresthesias 08/26/2019   Asthma 05/21/2019   Hypertension 05/21/2019   Postpartum depression 04/29/2019     Concerns addressed today  Delivery Plans Plans to deliver at [facility]. Discussed the nature of our practice with multiple providers including residents and students. Due to the size of the practice, the delivering provider may not be the same as those providing prenatal care.   Patient is interested in water birth.  MyChart/Babyscripts MyChart access verified. I explained pt will have some visits in office and some virtually. Babyscripts instructions given and order placed. Patient verifies receipt of registration text/e-mail. Account successfully created and app downloaded. If patient is a candidate for Optimized scheduling, add to sticky note.   Blood Pressure Cuff/Weight Scale Blood pressure cuff ordered for patient to pick-up from [employer]. Explained after first prenatal appt pt will check weekly and document in Babyscripts. Patient does not have weight scale; patient may purchase if they desire to track weight weekly in  Babyscripts.  Anatomy US  Explained first scheduled US  will be around 19 weeks. Anatomy US  scheduled for TBD at TBD.  Is patient a candidate for Babyscripts Optimization? No, due to Risk Factors   First visit review I reviewed new OB appt with patient. Explained pt will be seen by Susi Eric, NP at first visit. Discussed [facility] [place] genetic screening with patient. Requests Panorama. Routine prenatal labs OB Urine, GC/CC only collected at today's visit. Initial OB labs deferred to New OB appointment.   Last Pap Diagnosis  Date Value Ref Range Status  01/09/2023   Final   - Negative for intraepithelial lesion or malignancy (NILM)    Donette Furlong, RN 08/22/2023  9:33 AM

## 2023-08-22 NOTE — Patient Instructions (Signed)

## 2023-08-23 ENCOUNTER — Other Ambulatory Visit: Payer: Self-pay

## 2023-08-23 LAB — CERVICOVAGINAL ANCILLARY ONLY
Bacterial Vaginitis (gardnerella): POSITIVE — AB
Candida Glabrata: NEGATIVE
Candida Vaginitis: NEGATIVE
Chlamydia: NEGATIVE
Comment: NEGATIVE
Comment: NEGATIVE
Comment: NEGATIVE
Comment: NEGATIVE
Comment: NORMAL
Neisseria Gonorrhea: NEGATIVE

## 2023-08-23 LAB — BETA HCG QUANT (REF LAB): hCG Quant: 88461 m[IU]/mL

## 2023-08-23 MED ORDER — PROMETHAZINE HCL 25 MG PO TABS: 25.0000 mg | ORAL_TABLET | Freq: Four times a day (QID) | ORAL | 1 refills | Status: DC | PRN

## 2023-08-24 LAB — URINE CULTURE, OB REFLEX

## 2023-08-24 LAB — CULTURE, OB URINE

## 2023-08-28 ENCOUNTER — Other Ambulatory Visit: Payer: Self-pay

## 2023-08-28 MED ORDER — METRONIDAZOLE 500 MG PO TABS: 500.0000 mg | ORAL_TABLET | Freq: Two times a day (BID) | ORAL | 0 refills | Status: DC

## 2023-09-12 ENCOUNTER — Inpatient Hospital Stay (HOSPITAL_COMMUNITY)
Admission: AD | Admit: 2023-09-12 | Discharge: 2023-09-12 | Discharge status: Against Medical Advice | Attending: Obstetrics and Gynecology | Admitting: Obstetrics and Gynecology

## 2023-09-12 ENCOUNTER — Other Ambulatory Visit: Payer: Self-pay

## 2023-09-12 DIAGNOSIS — O26891 Other specified pregnancy related conditions, first trimester: Secondary | ICD-10-CM | POA: Insufficient documentation

## 2023-09-12 DIAGNOSIS — R109 Unspecified abdominal pain: Secondary | ICD-10-CM | POA: Diagnosis not present

## 2023-09-12 DIAGNOSIS — Z3A10 10 weeks gestation of pregnancy: Secondary | ICD-10-CM | POA: Insufficient documentation

## 2023-09-12 DIAGNOSIS — M545 Low back pain, unspecified: Secondary | ICD-10-CM | POA: Diagnosis not present

## 2023-09-12 DIAGNOSIS — Z5321 Procedure and treatment not carried out due to patient leaving prior to being seen by health care provider: Secondary | ICD-10-CM | POA: Diagnosis not present

## 2023-09-12 DIAGNOSIS — Z3480 Encounter for supervision of other normal pregnancy, unspecified trimester: Secondary | ICD-10-CM

## 2023-09-12 LAB — WET PREP, GENITAL
Sperm: NONE SEEN
Trich, Wet Prep: NONE SEEN
WBC, Wet Prep HPF POC: <10 (ref <10)
Yeast Wet Prep HPF POC: NONE SEEN

## 2023-09-12 MED ORDER — ACETAMINOPHEN 500 MG PO TABS: 1000.0000 mg | ORAL_TABLET | Freq: Once | ORAL | Status: DC

## 2023-09-12 NOTE — Progress Notes (Signed)
 Patient called from lobby to perform EKG.  Pt not in lobby, will call again in 5-10 minutes.

## 2023-09-12 NOTE — Progress Notes (Signed)
 Pt called a third time, no answer and not in lobby.

## 2023-09-12 NOTE — MAU Provider Note (Signed)
 Pt was called three separate times by charge nurse and could not be located.  Pt presumed elopement before MSE could be completed.    Candice Chalet, MD Family Medicine - Obstetrics Fellow

## 2023-09-12 NOTE — Progress Notes (Signed)
Pt called again from lobby, no answer.

## 2023-09-12 NOTE — MAU Note (Signed)
 Bailey Carney is a 32 y.o. at [redacted]w[redacted]d here in MAU reporting: she's having abdominal cramping and lower back pain that began Monday.  Reports she's also having intermittent SOB for the past two  weeks.  Denies VB, had spotting Tuesday which has since resolved.   Reports took Tylenol  two extra strength Tylenol  last night, "not working." LMP: 06/22/2023 Onset of complaint: Monday Pain score: 8 Vitals:   09/12/23 1154  BP: 120/75  Pulse: 84  Resp: 18  Temp: 98.3 F (36.8 C)  SpO2: 98%     FHT: 173 bpm  Lab orders placed from triage: UA

## 2023-09-13 LAB — GC/CHLAMYDIA PROBE AMP (~~LOC~~) NOT AT ARMC
Chlamydia: NEGATIVE
Comment: NEGATIVE
Comment: NORMAL
Neisseria Gonorrhea: NEGATIVE

## 2023-09-24 ENCOUNTER — Ambulatory Visit: Admitting: Obstetrics and Gynecology

## 2023-09-24 VITALS — BP 127/80 | HR 90 | Wt 234.3 lb

## 2023-09-24 DIAGNOSIS — Z1331 Encounter for screening for depression: Secondary | ICD-10-CM

## 2023-09-24 DIAGNOSIS — Z3A12 12 weeks gestation of pregnancy: Secondary | ICD-10-CM

## 2023-09-24 DIAGNOSIS — Z3480 Encounter for supervision of other normal pregnancy, unspecified trimester: Secondary | ICD-10-CM

## 2023-09-24 DIAGNOSIS — O10011 Pre-existing essential hypertension complicating pregnancy, first trimester: Secondary | ICD-10-CM

## 2023-09-24 DIAGNOSIS — I10 Essential (primary) hypertension: Secondary | ICD-10-CM | POA: Insufficient documentation

## 2023-09-24 MED ORDER — ASPIRIN 81 MG PO TBEC: 162.0000 mg | DELAYED_RELEASE_TABLET | Freq: Every day | ORAL | 2 refills | Status: DC

## 2023-09-24 MED ORDER — VITAFOL GUMMIES 3.33-0.333-34.8 MG PO CHEW: 3.0000 | CHEWABLE_TABLET | Freq: Every day | ORAL | 5 refills | Status: DC

## 2023-09-24 MED ORDER — ONDANSETRON 4 MG PO TBDP: 4.0000 mg | ORAL_TABLET | Freq: Four times a day (QID) | ORAL | 2 refills | Status: DC | PRN

## 2023-09-24 NOTE — Addendum Note (Signed)
 Addended by: [place] Dalporto M on: 09/24/2023 06:01 PM   Modules accepted: Orders

## 2023-09-24 NOTE — Progress Notes (Signed)
 Pt presents for new ob. Pt complains of nausea medication not working.pt complains of shortness of breath.

## 2023-09-24 NOTE — Progress Notes (Signed)
 INITIAL PRENATAL VISIT  Subjective:   Bailey Carney is being seen today for her first obstetrical visit. She is at [redacted]w[redacted]d gestation by ultrasound. Her obstetrical history is significant for chronic hypertension. Relationship with FOB: significant other, not living together. Patient pumping breastmilk and formula intend to breast feed. Pregnancy history fully reviewed.  Patient reports headache, occasional dizziness .  Indications for ASA therapy (per uptodate) One of the following: Previous pregnancy with preeclampsia, especially early onset and with an adverse outcome No Multifetal gestation No Chronic hypertension Yes Type 1 or 2 diabetes mellitus No Chronic kidney disease No Autoimmune disease (antiphospholipid syndrome, systemic lupus erythematosus) No   Objective:    Obstetric History OB History  Gravida Para Term Preterm AB Living  3 1 1  0 1 1  SAB IAB Ectopic Multiple Live Births  1 0 0 0 1    # Outcome Date GA Lbr Len/2nd Weight Sex Type Anes PTL Lv  3 Current           2 SAB 2023     SAB     1 Term 04/18/18 [redacted]w[redacted]d 03:03 / 00:02 6 lb 3.1 oz (2.81 kg) M Vag-Spont EPI  LIV    Past Medical History:  Diagnosis Date   Asthma    does not use inhaler often   Chlamydia    Cholecystitis    Genital herpes    Hypertension    on meds    Past Surgical History:  Procedure Laterality Date   NO PAST SURGERIES      Current Outpatient Medications on File Prior to Visit  Medication Sig Dispense Refill   acetaminophen  (TYLENOL ) 325 MG tablet Take 2 tablets (650 mg total) by mouth every 6 (six) hours as needed. 30 tablet 0   albuterol  (PROVENTIL  HFA;VENTOLIN  HFA) 108 (90 Base) MCG/ACT inhaler Inhale 1-2 puffs into the lungs every 6 (six) hours as needed for wheezing or shortness of breath. 1 Inhaler 0   amLODipine  (NORVASC ) 10 MG tablet Take 1 tablet (10 mg total) by mouth daily. 30 tablet 6   Blood Pressure Monitoring (BLOOD PRESSURE KIT) DEVI 1 Device by Does not apply  route once a week. 1 each 0   promethazine  (PHENERGAN ) 25 MG tablet Take 1 tablet (25 mg total) by mouth every 6 (six) hours as needed for nausea or vomiting. 30 tablet 1   amLODipine  (NORVASC ) 10 MG tablet Take by mouth.     fluconazole  (DIFLUCAN ) 150 MG tablet Take 1 tablet (150 mg total) by mouth every three (3) days as needed. (Patient not taking: Reported on 09/24/2023) 2 tablet 0   metroNIDAZOLE  (FLAGYL ) 500 MG tablet Take 1 tablet (500 mg total) by mouth 2 (two) times daily. (Patient not taking: Reported on 09/24/2023) 14 tablet 0   Prenatal Vit-Fe Fumarate-FA (PRENATAL VITAMIN PO) Take 1 tablet by mouth daily. 2 gummies (Patient not taking: Reported on 09/24/2023)     Prenatal Vit-Fe Fumarate-FA (PRENATAL VITAMIN) 27-0.8 MG TABS Take 1 tablet by mouth daily. (Patient not taking: Reported on 09/24/2023) 30 tablet 6   No current facility-administered medications on file prior to visit.    Allergies  Allergen Reactions   Amoxicillin Rash and Other (See Comments)    Has patient had a PCN reaction causing immediate rash, facial/tongue/throat swelling, SOB or lightheadedness with hypotension: No Has patient had a PCN reaction causing severe rash involving mucus membranes or skin necrosis: No Has patient had a PCN reaction that required hospitalization No Has patient  had a PCN reaction occurring within the last 10 years: No If all of the above answers are "NO", then may proceed with Cephalosporin use.     Social History:  reports that she has never smoked. She has never used smokeless tobacco. She reports that she does not currently use alcohol. She reports that she does not use drugs.  Family History  Problem Relation Age of Onset   Hypertension Mother    Breast cancer Maternal Grandmother    Hypertension Maternal Grandmother    Cancer Maternal Grandmother    Dementia Maternal Grandmother    Cancer Maternal Grandfather    Breast cancer Paternal Grandmother    Cancer Paternal  Grandmother     The following portions of the patient's history were reviewed and updated as appropriate: allergies, current medications, past family history, past medical history, past social history, past surgical history and problem list.  Review of Systems Review of Systems  Neurological:  Positive for headaches.      Physical Exam:  BP 127/80   Pulse 90   Wt 234 lb 4.8 oz (106.3 kg)   LMP 06/22/2023 (Exact Date)   BMI 36.70 kg/m  CONSTITUTIONAL: Well-developed, well-nourished female in no acute distress.  HENT:  Normocephalic, atraumatic.   EYES: Conjunctivae normal. NECK: Normal range of motion SKIN: Skin is warm and dry. MUSCULOSKELETAL: Normal range of motion NEUROLOGIC: Alert and oriented  PSYCHIATRIC: Normal mood and affect. Normal behavior.  CARDIOVASCULAR: Normal heart rate noted RESPIRATORY: normal effort ABDOMEN: Soft PELVIC:deferred      Movement: Absent       Assessment:    Pregnancy: G3P1011  1. Supervision of other normal pregnancy, antepartum (Primary) BP and FHR normal Detail ultrasound ordered  - CBC/D/Plt+RPR+Rh+ABO+RubIgG... - Hemoglobin A1c - PANORAMA PRENATAL TEST - HORIZON Basic Panel  2. [redacted] weeks gestation of pregnancy Discussed headache treatment to include exedrine tension, magnesium, adequate hydration, discussed when to follow up if symptoms worsen or are unresolved  3. Chronic hypertension To start 162mg  ASA Continue amlodipine  Continue checking bp at home, discussed when to follow up or go to MAU  - aspirin  EC 81 MG tablet; Take 2 tablets (162 mg total) by mouth daily. Start taking when you are [redacted] weeks pregnant for rest of pregnancy for prevention of preeclampsia  Dispense: 300 tablet; Refill: 2 - Comp Met (CMET) - Protein / creatinine ratio, urine  Plan:     Initial labs drawn. Prenatal vitamins. Problem list reviewed and updated. Reviewed in detail the nature of the practice with collaborative care between  Genetic  screening discussed: NIPS/First trimester screen/Quad/AFP ordered. Role of ultrasound in pregnancy discussed; Anatomy US : ordered.  Discussed clinic routines, schedule of care and testing, genetic screening options, involvement of students and residents under the direct supervision of APPs and doctors and presence of female providers. Pt verbalized understanding.  Future Appointments  Date Time Provider Department Center  10/23/2023  4:10 PM Davis, Devon E, PA-C [facility] None    Zelma Hidden, [place]

## 2023-09-25 ENCOUNTER — Ambulatory Visit: Payer: Self-pay | Admitting: Obstetrics and Gynecology

## 2023-09-25 DIAGNOSIS — Z3480 Encounter for supervision of other normal pregnancy, unspecified trimester: Secondary | ICD-10-CM

## 2023-09-25 LAB — COMPREHENSIVE METABOLIC PANEL WITH GFR
ALT: 30 IU/L (ref 0–32)
AST: 27 IU/L (ref 0–40)
Albumin: 4.1 g/dL (ref 4.0–5.0)
Alkaline Phosphatase: 47 IU/L (ref 44–121)
BUN/Creatinine Ratio: 22 (ref 9–23)
BUN: 13 mg/dL (ref 6–20)
Bilirubin Total: 0.3 mg/dL (ref 0.0–1.2)
CO2: 19 mmol/L — ABNORMAL LOW (ref 20–29)
Calcium: 9.6 mg/dL (ref 8.7–10.2)
Chloride: 104 mmol/L (ref 96–106)
Creatinine, Ser: 0.6 mg/dL (ref 0.57–1.00)
Globulin, Total: 2.5 g/dL (ref 1.5–4.5)
Glucose: 101 mg/dL — ABNORMAL HIGH (ref 70–99)
Potassium: 4 mmol/L (ref 3.5–5.2)
Sodium: 138 mmol/L (ref 134–144)
Total Protein: 6.6 g/dL (ref 6.0–8.5)
eGFR: 124 mL/min/{1.73_m2} (ref >59)

## 2023-09-25 LAB — CBC/D/PLT+RPR+RH+ABO+RUBIGG...
Antibody Screen: NEGATIVE
Basophils Absolute: 0 10*3/uL (ref 0.0–0.2)
Basos: 0 %
EOS (ABSOLUTE): 0 10*3/uL (ref 0.0–0.4)
Eos: 0 %
HCV Ab: NONREACTIVE
HIV Screen 4th Generation wRfx: NONREACTIVE
Hematocrit: 36.8 % (ref 34.0–46.6)
Hemoglobin: 12.4 g/dL (ref 11.1–15.9)
Hepatitis B Surface Ag: NEGATIVE
Immature Grans (Abs): 0 10*3/uL (ref 0.0–0.1)
Immature Granulocytes: 0 %
Lymphocytes Absolute: 1.9 10*3/uL (ref 0.7–3.1)
Lymphs: 19 %
MCH: 30 pg (ref 26.6–33.0)
MCHC: 33.7 g/dL (ref 31.5–35.7)
MCV: 89 fL (ref 79–97)
Monocytes Absolute: 0.5 10*3/uL (ref 0.1–0.9)
Monocytes: 5 %
Neutrophils Absolute: 7.5 10*3/uL — ABNORMAL HIGH (ref 1.4–7.0)
Neutrophils: 76 %
Platelets: 183 10*3/uL (ref 150–450)
RBC: 4.14 x10E6/uL (ref 3.77–5.28)
RDW: 13.3 % (ref 11.7–15.4)
RPR Ser Ql: NONREACTIVE
Rh Factor: POSITIVE
Rubella Antibodies, IGG: 1.01 {index} (ref >0.99)
WBC: 10 10*3/uL (ref 3.4–10.8)

## 2023-09-25 LAB — HEMOGLOBIN A1C
Est. average glucose Bld gHb Est-mCnc: 111 mg/dL
Hgb A1c MFr Bld: 5.5 % (ref 4.8–5.6)

## 2023-09-25 LAB — PROTEIN / CREATININE RATIO, URINE
Creatinine, Urine: 111.3 mg/dL
Protein, Ur: 6.4 mg/dL
Protein/Creat Ratio: 58 mg/g{creat} (ref 0–200)

## 2023-09-25 LAB — HCV INTERPRETATION

## 2023-09-29 LAB — PANORAMA PRENATAL TEST FULL PANEL:PANORAMA TEST PLUS 5 ADDITIONAL MICRODELETIONS: FETAL FRACTION: 7.3

## 2023-10-02 ENCOUNTER — Encounter: Admitting: Obstetrics and Gynecology

## 2023-10-02 LAB — HORIZON CUSTOM: REPORT SUMMARY: NEGATIVE

## 2023-10-04 ENCOUNTER — Telehealth: Payer: Self-pay | Admitting: Obstetrics and Gynecology

## 2023-10-04 DIAGNOSIS — B9689 Other specified bacterial agents as the cause of diseases classified elsewhere: Secondary | ICD-10-CM

## 2023-10-04 MED ORDER — METRONIDAZOLE 500 MG PO TABS: 500.0000 mg | ORAL_TABLET | Freq: Two times a day (BID) | ORAL | 0 refills | Status: AC

## 2023-10-04 MED ORDER — FLUCONAZOLE 150 MG PO TABS: 150.0000 mg | ORAL_TABLET | Freq: Once | ORAL | 1 refills | Status: AC

## 2023-10-04 NOTE — Telephone Encounter (Signed)
 Called regarding results. Had tx early may, did not receive tx when she tested positive BV in hospital. Experiencing symptoms. Sending tx today.

## 2023-10-10 ENCOUNTER — Inpatient Hospital Stay (HOSPITAL_COMMUNITY)
Admission: AD | Admit: 2023-10-10 | Discharge: 2023-10-10 | Disposition: A | Discharge status: Home / Self Care | Attending: Family Medicine | Admitting: Family Medicine

## 2023-10-10 DIAGNOSIS — O26892 Other specified pregnancy related conditions, second trimester: Secondary | ICD-10-CM

## 2023-10-10 DIAGNOSIS — R519 Headache, unspecified: Secondary | ICD-10-CM | POA: Diagnosis not present

## 2023-10-10 DIAGNOSIS — Z79899 Other long term (current) drug therapy: Secondary | ICD-10-CM | POA: Insufficient documentation

## 2023-10-10 DIAGNOSIS — O99612 Diseases of the digestive system complicating pregnancy, second trimester: Secondary | ICD-10-CM | POA: Insufficient documentation

## 2023-10-10 DIAGNOSIS — R0789 Other chest pain: Secondary | ICD-10-CM | POA: Insufficient documentation

## 2023-10-10 DIAGNOSIS — G44209 Tension-type headache, unspecified, not intractable: Secondary | ICD-10-CM

## 2023-10-10 DIAGNOSIS — R0602 Shortness of breath: Secondary | ICD-10-CM | POA: Diagnosis not present

## 2023-10-10 DIAGNOSIS — O219 Vomiting of pregnancy, unspecified: Secondary | ICD-10-CM

## 2023-10-10 DIAGNOSIS — R110 Nausea: Secondary | ICD-10-CM | POA: Diagnosis not present

## 2023-10-10 DIAGNOSIS — M62838 Other muscle spasm: Secondary | ICD-10-CM

## 2023-10-10 DIAGNOSIS — Z3A14 14 weeks gestation of pregnancy: Secondary | ICD-10-CM

## 2023-10-10 DIAGNOSIS — K219 Gastro-esophageal reflux disease without esophagitis: Secondary | ICD-10-CM | POA: Diagnosis not present

## 2023-10-10 LAB — URINALYSIS, ROUTINE W REFLEX MICROSCOPIC
Bilirubin Urine: NEGATIVE
Glucose, UA: NEGATIVE mg/dL
Hgb urine dipstick: NEGATIVE
Ketones, ur: NEGATIVE mg/dL
Leukocytes,Ua: NEGATIVE
Nitrite: NEGATIVE
Protein, ur: NEGATIVE mg/dL
Specific Gravity, Urine: 1.016 (ref 1.005–1.030)
pH: 5 (ref 5.0–8.0)

## 2023-10-10 MED ORDER — METOCLOPRAMIDE HCL 10 MG PO TABS: 10.0000 mg | ORAL_TABLET | Freq: Four times a day (QID) | ORAL | 0 refills | Status: DC

## 2023-10-10 MED ORDER — CYCLOBENZAPRINE HCL 5 MG PO TABS
10.0000 mg | ORAL_TABLET | Freq: Once | ORAL | Status: AC
  Administered 2023-10-10: 10 mg via ORAL
  Filled 2023-10-10: qty 2

## 2023-10-10 MED ORDER — FAMOTIDINE 40 MG PO TABS: 40.0000 mg | ORAL_TABLET | Freq: Every day | ORAL | 3 refills | Status: DC

## 2023-10-10 MED ORDER — CYCLOBENZAPRINE HCL 10 MG PO TABS: 10.0000 mg | ORAL_TABLET | Freq: Two times a day (BID) | ORAL | 0 refills | Status: DC | PRN

## 2023-10-10 NOTE — Discharge Instructions (Addendum)
 It was a pleasure taking care of you tonight.  I am sorry you are having all these issues.  I think your headache may be related to your hydration status.  I recommend drinking a lot more liquids throughout the day and for the headache you can take Excedrin tension headache rather than just the Tylenol .  Do not take them both together because the Excedrin contains Tylenol .  For your nausea I have sent a medication called Reglan .  For your muscle spasms in your ribs I sent a medication called Flexeril .  I know that you said you were having issues with reflux from time to time and the burning in the center of your chest kind of sounds like it may be a different presentation of that.  I sent Pepcid  to your pharmacy which is safe to take during pregnancy.  If your symptoms worsen, you have any worsening shortness of breath, chest pain please return for further evaluation.  Below is a list of safe medications to take during pregnancy.  I hope you have a wonderful rest of your night!  Safe Medications in Pregnancy   Acne:  Benzoyl Peroxide  Salicylic Acid   Backache/Headache:  Tylenol : 2 regular strength every 4 hours OR               2 Extra strength every 6 hours   Colds/Coughs/Allergies:  Benadryl  (alcohol free) 25 mg every 6 hours as needed  Breath right strips  Claritin  Cepacol throat lozenges  Chloraseptic throat spray  Cold-Eeze- up to three times per day  Cough drops, alcohol free  Flonase  (by prescription only)  Guaifenesin   Mucinex   Robitussin DM (plain only, alcohol free)  Saline nasal spray/drops  Sudafed (pseudoephedrine ) & Actifed * use only after [redacted] weeks gestation and if you do not have high blood pressure  Tylenol   Vicks Vaporub  Zinc lozenges  Zyrtec    Constipation:  Colace  Ducolax suppositories  Fleet enema  Glycerin  suppositories  Metamucil  Milk of magnesia  Miralax   Senokot  Smooth move tea   Diarrhea:  Kaopectate  Imodium A-D   *NO pepto Bismol    Hemorrhoids:  Anusol  Anusol HC  Preparation H  Tucks   Indigestion:  Tums  Maalox  Mylanta  Zantac  Pepcid    Insomnia:  Benadryl  (alcohol free) 25mg  every 6 hours as needed  Tylenol  PM  Unisom, no Gelcaps   Leg Cramps:  Tums  MagGel   Nausea/Vomiting:  Bonine  Dramamine  Emetrol  Ginger extract  Sea bands  Meclizine  Nausea medication to take during pregnancy:  Unisom (doxylamine succinate 25 mg tablets) Take one tablet daily at bedtime. If symptoms are not adequately controlled, the dose can be increased to a maximum recommended dose of two tablets daily (1/2 tablet in the morning, 1/2 tablet mid-afternoon and one at bedtime).  Vitamin B6 100mg  tablets. Take one tablet twice a day (up to 200 mg per day).   Skin Rashes:  Aveeno products  Benadryl  cream or 25mg  every 6 hours as needed  Calamine Lotion  1% cortisone cream   Yeast infection:  Gyne-lotrimin  7  Monistat 7    **If taking multiple medications, please check labels to avoid duplicating the same active ingredients  **take medication as directed on the label  ** Do not exceed 4000 mg of tylenol  in 24 hours  **Do not take medications that contain aspirin  or ibuprofen 

## 2023-10-10 NOTE — MAU Note (Signed)
 LEYANI GARGUS is a 33 y.o. at [redacted]w[redacted]d here in MAU reporting: main thing is she has a HA for 3 days, Tylenol  really isn't helping.  Thought she might be dehydrated, was trying to drink more, doesn't have an appetite.  Has been SOB, doesn't seem to matter, can be talking and she will get short of breath. Is constantly sick to her stomach. Her dr's have given her 2 different medications to help and they aren't.  (Phenergan  and Zofran ). No abd pain. No vag bleeding.  Onset of complaint: 3days Pain score: HA 7 Vitals:   10/10/23 1839 10/10/23 1842  BP:  113/78  Pulse:  95  Resp:  18  Temp:  98.6 F (37 C)  SpO2: 100% 100%     FHT:150 Lab orders placed from triage:  urine

## 2023-10-10 NOTE — MAU Provider Note (Signed)
 History     CSN: [id number]  Arrival date and time: 10/10/23 1823   None     Chief Complaint  Patient presents with   Headache   Nausea   Shortness of Breath   HPI Patient is a 33 year old G3, P1 at 14 weeks and 4 days presenting for headache for 3 days.  Patient reports that she has been taking Tylenol  for the headache but has not been helping.  Took 1000 mg this morning as well as another 1000 mg around 4 PM.  Only slight improvement in the headache.  Reports that she has not been drinking much fluid over the last 3 days and also reports that she has not eaten much today and feels like that may be contributing to the headache and would like to eat.  Patient has issues with nausea as well and has been previously prescribed Phenergan  and Zofran  without any improvement says that the nausea comes and goes.  Reports episodes of shortness of breath and chest pain with a burning in the center of her chest that radiates to her right breast.  She reports it is like a sharp pain around her ribs and then it resolves.  She is not currently experiencing shortness of breath or chest pain.  She cannot identify any trigger for these episodes.  Does acknowledge that she has history of acid reflux and takes Tums but no other medications.  OB History     Gravida  3   Para  1   Term  1   Preterm  0   AB  1   Living  1      SAB  1   IAB  0   Ectopic  0   Multiple  0   Live Births  1           Past Medical History:  Diagnosis Date   Asthma    does not use inhaler often   Chlamydia    Cholecystitis    Genital herpes    Hypertension    on meds    Past Surgical History:  Procedure Laterality Date   NO PAST SURGERIES      Family History  Problem Relation Age of Onset   Hypertension Mother    Breast cancer Maternal Grandmother    Hypertension Maternal Grandmother    Cancer Maternal Grandmother    Dementia Maternal Grandmother    Cancer Maternal Grandfather     Breast cancer Paternal Grandmother    Cancer Paternal Grandmother     Social History   Tobacco Use   Smoking status: Never   Smokeless tobacco: Never  Vaping Use   Vaping status: Never Used  Substance Use Topics   Alcohol use: Not Currently    Comment: socially   Drug use: No    Allergies:  Allergies  Allergen Reactions   Amoxicillin Rash and Other (See Comments)    Has patient had a PCN reaction causing immediate rash, facial/tongue/throat swelling, SOB or lightheadedness with hypotension: No Has patient had a PCN reaction causing severe rash involving mucus membranes or skin necrosis: No Has patient had a PCN reaction that required hospitalization No Has patient had a PCN reaction occurring within the last 10 years: No If all of the above answers are NO, then may proceed with Cephalosporin use.     Medications Prior to Admission  Medication Sig Dispense Refill Last Dose/Taking   acetaminophen  (TYLENOL ) 325 MG tablet Take 2 tablets (650 mg total) by  mouth every 6 (six) hours as needed. 30 tablet 0    albuterol  (PROVENTIL  HFA;VENTOLIN  HFA) 108 (90 Base) MCG/ACT inhaler Inhale 1-2 puffs into the lungs every 6 (six) hours as needed for wheezing or shortness of breath. 1 Inhaler 0    amLODipine  (NORVASC ) 10 MG tablet Take by mouth.      amLODipine  (NORVASC ) 10 MG tablet Take 1 tablet (10 mg total) by mouth daily. 30 tablet 6    aspirin  EC 81 MG tablet Take 2 tablets (162 mg total) by mouth daily. Start taking when you are [redacted] weeks pregnant for rest of pregnancy for prevention of preeclampsia 300 tablet 2    Blood Pressure Monitoring (BLOOD PRESSURE KIT) DEVI 1 Device by Does not apply route once a week. 1 each 0    metroNIDAZOLE  (FLAGYL ) 500 MG tablet Take 1 tablet (500 mg total) by mouth 2 (two) times daily for 7 days. 14 tablet 0    ondansetron  (ZOFRAN -ODT) 4 MG disintegrating tablet Take 1 tablet (4 mg total) by mouth every 6 (six) hours as needed for nausea. 20 tablet 2     Prenatal Vit-Fe Fumarate-FA (PRENATAL VITAMIN PO) Take 1 tablet by mouth daily. 2 gummies (Patient not taking: Reported on 09/24/2023)      Prenatal Vit-Fe Fumarate-FA (PRENATAL VITAMIN) 27-0.8 MG TABS Take 1 tablet by mouth daily. (Patient not taking: Reported on 09/24/2023) 30 tablet 6    Prenatal Vit-Fe Phos-FA-Omega (VITAFOL  GUMMIES) 3.33-0.333-34.8 MG CHEW Chew 3 tablets by mouth daily. 90 tablet 5    promethazine  (PHENERGAN ) 25 MG tablet Take 1 tablet (25 mg total) by mouth every 6 (six) hours as needed for nausea or vomiting. 30 tablet 1     Review of Systems  Constitutional:  Negative for chills and fever.  Respiratory:  Positive for shortness of breath.   Cardiovascular:  Positive for chest pain.   Physical Exam   Blood pressure 113/78, pulse 95, temperature 98.6 F (37 C), temperature source Oral, resp. rate 18, height 5' 7 (1.702 m), weight 105.5 kg, last menstrual period 06/22/2023, SpO2 100%.  Physical Exam Vitals and nursing note reviewed.  Constitutional:      Appearance: Normal appearance.  HENT:     Head: Normocephalic and atraumatic.     Nose: No congestion or rhinorrhea.   Eyes:     Extraocular Movements: Extraocular movements intact.    Cardiovascular:     Rate and Rhythm: Normal rate.  Pulmonary:     Effort: Pulmonary effort is normal.     Breath sounds: No decreased breath sounds, wheezing, rhonchi or rales.  Abdominal:     Palpations: Abdomen is soft.     Tenderness: There is no abdominal tenderness.   Musculoskeletal:        General: Tenderness (Tenderness to palpation along right fifth rib) present. Normal range of motion.     Cervical back: Normal range of motion.   Skin:    General: Skin is warm.     Capillary Refill: Capillary refill takes less than 2 seconds.   Neurological:     General: No focal deficit present.     Mental Status: She is alert.     Cranial Nerves: No cranial nerve deficit.     Sensory: No sensory deficit.   Psychiatric:         Mood and Affect: Mood normal.        Behavior: Behavior normal.     MAU Course  Procedures  MDM Physical exam Flexeril   Assessment and Plan  Bailey Carney is a 33 year old G3 P1-0-1-1 at 14 weeks and 4 days presenting for multitude of issues including headache, chest pain, shortness of breath.  Headache Headache likely tension per patient's description.  No sensory deficits.  Physical exam was reassuring.  Discussed the use of Excedrin tension headache which she is willing to try.  Because she has recently taken Tylenol  cannot give her the Excedrin at this time.  Have given her dose of Flexeril  but patient is ready to go home and eat.  Recommended adequate hydration and return precautions if headache did not resolve.  Chest pain Chest pain is right rib pain starting on the lateral side of her fifth rib that sometimes shoots to the center.  Feel that costochondritis is likely diagnosis for this but she also has some symptoms consistent with acid reflux.  Prescription sent for Flexeril  as well as Pepcid .  Return precautions given regarding this.  Shortness of breath Unclear etiology for the shortness of breath.  Patient reports episodic shortness of breath that she is not currently experiencing.  She does not know what the trigger is.  She is not tachycardic, not tachypneic, not hypoxic.  She has no signs of a DVT.  Lung fields are clear to auscultation with normal work of breathing.  She does report she has asthma but has not had issues with it since she was younger.  No need for treatment at this time but return precautions given.  Bailey Carney 10/10/2023, 9:52 PM

## 2023-10-20 NOTE — Progress Notes (Unsigned)
   PRENATAL VISIT NOTE  Subjective:  Bailey Carney is a 33 y.o. G3P1011 at [redacted]w[redacted]d being seen today for ongoing prenatal care.  She is currently monitored for the following issues for this high-risk pregnancy and has Asthma; Hypertension; History of postpartum depression; Paresthesias; Hirsutism; Supervision of other normal pregnancy, antepartum; and Chronic hypertension on their problem list.  Patient reports no complaints.  Contractions: Not present. Vag. Bleeding: None.  Movement: Present. Denies leaking of fluid.   The following portions of the patient's history were reviewed and updated as appropriate: allergies, current medications, past family history, past medical history, past social history, past surgical history and problem list.   Objective:    Vitals:   10/23/23 1620  BP: 122/83  Pulse: 93  Weight: 236 lb 9.6 oz (107.3 kg)    Fetal Status:  Fetal Heart Rate (bpm): 153   Movement: Present    General: Alert, oriented and cooperative. Patient is in no acute distress.  Skin: Skin is warm and dry. No rash noted.   Cardiovascular: Normal heart rate noted  Respiratory: Normal respiratory effort, no problems with respiration noted  Abdomen: Soft, gravid, appropriate for gestational age.  Pain/Pressure: Present     Pelvic: Cervical exam deferred        Extremities: Normal range of motion.  Edema: Trace  Mental Status: Normal mood and affect. Normal behavior. Normal judgment and thought content.   Assessment and Plan:  Pregnancy: G3P1011 at [redacted]w[redacted]d  1. Supervision of other normal pregnancy, antepartum (Primary) Patient doing well, feeling regular fetal movement BP, FHR appropriate  2. [redacted] weeks gestation of pregnancy Anticipatory guidance regarding upcoming visit/weeks of pregnancy given 11/13/2023 anatomy scan  3. Chronic hypertension Continue ASA Normotensive on amlodipine  10 mg daily Normal baseline labs Delivery at 39 weeks Ssxs preE reviewed   4. Nausea and  vomiting during pregnancy - Doxylamine -Pyridoxine  (DICLEGIS ) 10-10 MG TBEC; Take 2 tablets by mouth at bedtime. If symptoms persist, add one tablet in the morning and one in the afternoon  Dispense: 100 tablet; Refill: 5   Preterm labor symptoms and general obstetric precautions including but not limited to vaginal bleeding, contractions, leaking of fluid and fetal movement were reviewed in detail with the patient.  Please refer to After Visit Summary for other counseling recommendations.   Return in about 4 weeks (around 11/20/2023) for [facility].  Future Appointments  Date Time Provider Department Center  11/13/2023 10:00 AM [facility] PROVIDER 1 [facility]  11/13/2023 10:30 AM [facility] US3 [facility]    Jorene FORBES Moats, PA-C

## 2023-10-23 ENCOUNTER — Encounter: Payer: Self-pay | Admitting: Physician Assistant

## 2023-10-23 ENCOUNTER — Ambulatory Visit (INDEPENDENT_AMBULATORY_CARE_PROVIDER_SITE_OTHER): Admitting: Physician Assistant

## 2023-10-23 VITALS — BP 122/83 | HR 93 | Wt 236.6 lb

## 2023-10-23 DIAGNOSIS — O219 Vomiting of pregnancy, unspecified: Secondary | ICD-10-CM | POA: Diagnosis not present

## 2023-10-23 DIAGNOSIS — Z3480 Encounter for supervision of other normal pregnancy, unspecified trimester: Secondary | ICD-10-CM

## 2023-10-23 DIAGNOSIS — I10 Essential (primary) hypertension: Secondary | ICD-10-CM

## 2023-10-23 DIAGNOSIS — Z3A16 16 weeks gestation of pregnancy: Secondary | ICD-10-CM | POA: Diagnosis not present

## 2023-10-23 DIAGNOSIS — Z3492 Encounter for supervision of normal pregnancy, unspecified, second trimester: Secondary | ICD-10-CM | POA: Diagnosis not present

## 2023-10-23 MED ORDER — DOXYLAMINE-PYRIDOXINE 10-10 MG PO TBEC: 2.0000 | DELAYED_RELEASE_TABLET | Freq: Every day | ORAL | 5 refills | Status: DC

## 2023-10-23 MED ORDER — VITAFOL GUMMIES 3.33-0.333-34.8 MG PO CHEW: 3.0000 | CHEWABLE_TABLET | Freq: Every day | ORAL | 5 refills | Status: DC

## 2023-10-23 NOTE — Progress Notes (Signed)
 Prenatals were making here feel sick so she has stopped taking them.

## 2023-10-25 ENCOUNTER — Ambulatory Visit: Payer: Self-pay | Admitting: Physician Assistant

## 2023-10-25 LAB — AFP, SERUM, OPEN SPINA BIFIDA
AFP MoM: 0.65
AFP Value: 18.1 ng/mL
Gest. Age on Collection Date: 16 wk
Maternal Age At EDD: 31.4 a
OSBR Risk 1 IN: 10000
Test Results:: NEGATIVE
Weight: 236 [lb_av]

## 2023-11-13 ENCOUNTER — Other Ambulatory Visit

## 2023-11-13 ENCOUNTER — Telehealth: Payer: Self-pay

## 2023-11-13 ENCOUNTER — Ambulatory Visit: Attending: Obstetrics & Gynecology | Admitting: Obstetrics and Gynecology

## 2023-11-13 ENCOUNTER — Ambulatory Visit

## 2023-11-13 ENCOUNTER — Other Ambulatory Visit: Payer: Self-pay | Admitting: *Deleted

## 2023-11-13 VITALS — BP 120/66 | HR 77

## 2023-11-13 DIAGNOSIS — I10 Essential (primary) hypertension: Secondary | ICD-10-CM

## 2023-11-13 DIAGNOSIS — O358XX0 Maternal care for other (suspected) fetal abnormality and damage, not applicable or unspecified: Secondary | ICD-10-CM | POA: Insufficient documentation

## 2023-11-13 DIAGNOSIS — O10012 Pre-existing essential hypertension complicating pregnancy, second trimester: Secondary | ICD-10-CM | POA: Diagnosis not present

## 2023-11-13 DIAGNOSIS — O99212 Obesity complicating pregnancy, second trimester: Secondary | ICD-10-CM | POA: Diagnosis not present

## 2023-11-13 DIAGNOSIS — O10112 Pre-existing hypertensive heart disease complicating pregnancy, second trimester: Secondary | ICD-10-CM | POA: Diagnosis not present

## 2023-11-13 DIAGNOSIS — J45909 Unspecified asthma, uncomplicated: Secondary | ICD-10-CM | POA: Insufficient documentation

## 2023-11-13 DIAGNOSIS — Z3480 Encounter for supervision of other normal pregnancy, unspecified trimester: Secondary | ICD-10-CM

## 2023-11-13 DIAGNOSIS — O283 Abnormal ultrasonic finding on antenatal screening of mother: Secondary | ICD-10-CM

## 2023-11-13 DIAGNOSIS — O99512 Diseases of the respiratory system complicating pregnancy, second trimester: Secondary | ICD-10-CM | POA: Insufficient documentation

## 2023-11-13 DIAGNOSIS — O35BXX0 Maternal care for other (suspected) fetal abnormality and damage, fetal cardiac anomalies, not applicable or unspecified: Secondary | ICD-10-CM

## 2023-11-13 DIAGNOSIS — Z3A19 19 weeks gestation of pregnancy: Secondary | ICD-10-CM | POA: Insufficient documentation

## 2023-11-13 DIAGNOSIS — Z363 Encounter for antenatal screening for malformations: Secondary | ICD-10-CM | POA: Diagnosis not present

## 2023-11-13 NOTE — Telephone Encounter (Signed)
 Patient called RN line regarding questions in concerns of her US  appointment she had done today. Upon chart review, patient is a [facility] patient. Will route message to [facility] clinical pool regarding patient's questions.    Rosaline, RN

## 2023-11-13 NOTE — Progress Notes (Signed)
 Maternal-Fetal Medicine Consultation Name: Bailey Carney MRN: [id number]  G3 P1011 at 19w 3d gestation.  Patient is here for fetal anatomy scan. On cell-free fetal DNA screening, the risks of fetal aneuploidies are not increased.  MSAFP screening showed low risk for open neural tube defects.  Patient has chronic hypertension and takes amlodipine  10 mg daily.  Blood pressure today at our office is 120/66 mmHg.  She takes low-dose aspirin  prophylaxis.  Ultrasound We performed fetal anatomy scan.  An echogenic intracardiac focus is seen.  No other makers of aneuploidies or fetal structural defects are seen. Fetal biometry is consistent with her previously-established dates. Amniotic fluid is normal and good fetal activity is seen. Patient understands the limitations of ultrasound in detecting fetal anomalies.  As maternal obesity imposes limitations on the resolution of images, fetal anomalies may be missed.  Chronic hypertension -Adverse outcomes of severe chronic hypertension include maternal stroke, endorgan damage, coagulation disturbances. Placental abruption is more common. -Superimposed preeclampsia occurs in more than 30% of women with chronic hypertension I discussed the benefit of low-dose aspirin  prophylaxis that helps delaying or preventing preeclampsia. -I discussed the safety profile of antihypertensives. Amlodipine  can be safely given in pregnancy.   -I discussed ultrasound protocol of monitoring fetal growth assessment and antenatal testing. -Timing of delivery: Provided her blood pressures are well controlled, she can be delivered at 39 weeks' gestation.  Early term delivery is an option if hypertension is not well controlled.  Echogenic intracardiac focus (EIF) EIF is present in about 3% to 4% of normal fetuses and in some fetuses with Down syndrome.  Given that she has low risk for fetal Down syndrome on cell-free fetal DNA screening, this should be considered a normal variant.  I  did not recommend amniocentesis for this finding.  I counseled the patient that cell free fetal DNA screening has a greater detection rate for Down syndrome. However, only amniocentesis will give a definitive result on the fetal karyotype.  I explained amniocentesis procedure and possible complication of miscarriage (1 and 500 procedures). Patient opted not to have amniocentesis.  Recommendations - Appointments were made for her to return in 5 weeks in 9 weeks for fetal growth assessment. - Weekly BPP from [redacted] weeks gestation until delivery.    Consultation including face-to-face (more than 50%) counseling 30 minutes.

## 2023-11-27 ENCOUNTER — Ambulatory Visit: Admitting: Obstetrics and Gynecology

## 2023-11-27 VITALS — BP 126/81 | HR 81 | Wt 246.6 lb

## 2023-11-27 DIAGNOSIS — Z8759 Personal history of other complications of pregnancy, childbirth and the puerperium: Secondary | ICD-10-CM | POA: Diagnosis not present

## 2023-11-27 DIAGNOSIS — O10012 Pre-existing essential hypertension complicating pregnancy, second trimester: Secondary | ICD-10-CM | POA: Diagnosis not present

## 2023-11-27 DIAGNOSIS — Z8659 Personal history of other mental and behavioral disorders: Secondary | ICD-10-CM | POA: Diagnosis not present

## 2023-11-27 DIAGNOSIS — Z3A21 21 weeks gestation of pregnancy: Secondary | ICD-10-CM | POA: Diagnosis not present

## 2023-11-27 DIAGNOSIS — I10 Essential (primary) hypertension: Secondary | ICD-10-CM

## 2023-11-27 DIAGNOSIS — Z3480 Encounter for supervision of other normal pregnancy, unspecified trimester: Secondary | ICD-10-CM

## 2023-11-27 NOTE — Progress Notes (Signed)
 Pt reports fetal movement with intermittent pelvic pain.

## 2023-11-27 NOTE — Progress Notes (Signed)
   PRENATAL VISIT NOTE  Subjective:  Bailey Carney is a 33 y.o. G3P1011 at [redacted]w[redacted]d being seen today for ongoing prenatal care.  She is currently monitored for the following issues for this low-risk pregnancy and has Asthma; Hypertension; History of postpartum depression; Hirsutism; Supervision of other normal pregnancy, antepartum; and Chronic hypertension on their problem list.  Patient reports no complaints.  Contractions: Not present. Vag. Bleeding: None.  Movement: Present. Denies leaking of fluid.   The following portions of the patient's history were reviewed and updated as appropriate: allergies, current medications, past family history, past medical history, past social history, past surgical history and problem list.   Objective:    Vitals:   11/27/23 1631  BP: 126/81  Pulse: 81  Weight: 111.9 kg    Fetal Status:  Fetal Heart Rate (bpm): 142   Movement: Present    General: Alert, oriented and cooperative. Patient is in no acute distress.  Skin: Skin is warm and dry. No rash noted.   Cardiovascular: Normal heart rate noted  Respiratory: Normal respiratory effort, no problems with respiration noted  Abdomen: Soft, gravid, appropriate for gestational age.  Pain/Pressure: Present     Pelvic: Cervical exam deferred        Extremities: Normal range of motion.  Edema: Trace  Mental Status: Normal mood and affect. Normal behavior. Normal judgment and thought content.   Assessment and Plan:  Pregnancy: G3P1011 at [redacted]w[redacted]d 1. Supervision of other normal pregnancy, antepartum (Primary) Doing well. BP and FHR normal today. Normal AFP  Follow up u/s 12/19/23 growth   2. [redacted] weeks gestation of pregnancy Follow up 4 weeks  3. Chronic hypertension BP normal today. Stable on amlodipine  10 mg  4. History of postpartum depression Doing well   Preterm labor symptoms and general obstetric precautions including but not limited to vaginal bleeding, contractions, leaking of fluid and fetal  movement were reviewed in detail with the patient. Please refer to After Visit Summary for other counseling recommendations.   Return in about 4 weeks (around 12/25/2023) for ROB.  Future Appointments  Date Time Provider Department Center  12/19/2023 10:15 AM [facility] PROVIDER 1 [facility]  12/19/2023 10:30 AM [facility] US4 [facility]  01/16/2024  9:15 AM [facility] PROVIDER 1 [facility]  01/16/2024  9:30 AM [facility] US5 [facility]    Derrek JINNY Freund, NP Student

## 2023-12-19 ENCOUNTER — Ambulatory Visit: Attending: Obstetrics and Gynecology

## 2023-12-19 ENCOUNTER — Other Ambulatory Visit: Payer: Self-pay | Admitting: *Deleted

## 2023-12-19 ENCOUNTER — Ambulatory Visit: Admitting: Obstetrics

## 2023-12-19 VITALS — BP 128/71 | HR 68

## 2023-12-19 DIAGNOSIS — Z3A24 24 weeks gestation of pregnancy: Secondary | ICD-10-CM

## 2023-12-19 DIAGNOSIS — O99512 Diseases of the respiratory system complicating pregnancy, second trimester: Secondary | ICD-10-CM | POA: Diagnosis not present

## 2023-12-19 DIAGNOSIS — J45909 Unspecified asthma, uncomplicated: Secondary | ICD-10-CM

## 2023-12-19 DIAGNOSIS — O99212 Obesity complicating pregnancy, second trimester: Secondary | ICD-10-CM

## 2023-12-19 DIAGNOSIS — O10012 Pre-existing essential hypertension complicating pregnancy, second trimester: Secondary | ICD-10-CM

## 2023-12-19 DIAGNOSIS — I10 Essential (primary) hypertension: Secondary | ICD-10-CM | POA: Insufficient documentation

## 2023-12-19 DIAGNOSIS — E669 Obesity, unspecified: Secondary | ICD-10-CM | POA: Diagnosis not present

## 2023-12-19 DIAGNOSIS — O10912 Unspecified pre-existing hypertension complicating pregnancy, second trimester: Secondary | ICD-10-CM | POA: Diagnosis not present

## 2023-12-19 DIAGNOSIS — O402XX0 Polyhydramnios, second trimester, not applicable or unspecified: Secondary | ICD-10-CM

## 2023-12-19 NOTE — Progress Notes (Signed)
 MFM Consult Note  Bailey Carney is currently at 24 weeks and 4 days.  She has been followed due to maternal obesity with a BMI of 36.7 and chronic hypertension treated with Norvasc .    She denies any problems since her last exam.  Her blood pressure today was 128/71.  Sonographic findings Single intrauterine pregnancy at 24w 4d.  Fetal cardiac activity:  Observed and appears normal. Presentation: Cephalic. Interval fetal anatomy appears normal. Fetal biometry shows the estimated fetal weight of 1 pound 8 ounces which measures at the 27 percentile. Amniotic fluid volume: Within normal limits. MVP: 8.7 cm. Placenta: Posterior.  There are limitations of prenatal ultrasound such as the inability to detect certain abnormalities due to poor visualization. Various factors such as fetal position, gestational age and maternal body habitus may increase the difficulty in visualizing the fetal anatomy.    Due to chronic hypertension, we will continue to follow her with growth ultrasounds throughout her pregnancy.    Weekly fetal testing will be started at around 32 weeks.    Due to chronic hypertension and maternal obesity, delivery should be considered at between 38 to 39 weeks.    She will return in 4 weeks for another growth scan.    The patient stated that all of her questions were answered today.  A total of 20 minutes was spent counseling and coordinating the care for this patient.  Greater than 50% of the time was spent in direct face-to-face contact.

## 2023-12-25 ENCOUNTER — Other Ambulatory Visit (HOSPITAL_COMMUNITY)
Admission: RE | Admit: 2023-12-25 | Discharge: 2023-12-25 | Disposition: A | Discharge status: Home / Self Care | Source: Ambulatory Visit | Attending: Obstetrics and Gynecology | Admitting: Obstetrics and Gynecology

## 2023-12-25 ENCOUNTER — Ambulatory Visit: Admitting: Obstetrics and Gynecology

## 2023-12-25 VITALS — BP 107/78 | HR 74 | Wt 248.6 lb

## 2023-12-25 DIAGNOSIS — Z8759 Personal history of other complications of pregnancy, childbirth and the puerperium: Secondary | ICD-10-CM | POA: Diagnosis not present

## 2023-12-25 DIAGNOSIS — I10 Essential (primary) hypertension: Secondary | ICD-10-CM | POA: Diagnosis not present

## 2023-12-25 DIAGNOSIS — Z3480 Encounter for supervision of other normal pregnancy, unspecified trimester: Secondary | ICD-10-CM | POA: Diagnosis not present

## 2023-12-25 DIAGNOSIS — N898 Other specified noninflammatory disorders of vagina: Secondary | ICD-10-CM | POA: Diagnosis not present

## 2023-12-25 DIAGNOSIS — M545 Low back pain, unspecified: Secondary | ICD-10-CM

## 2023-12-25 DIAGNOSIS — Z8659 Personal history of other mental and behavioral disorders: Secondary | ICD-10-CM

## 2023-12-25 DIAGNOSIS — Z3A25 25 weeks gestation of pregnancy: Secondary | ICD-10-CM

## 2023-12-25 NOTE — Progress Notes (Addendum)
   PRENATAL VISIT NOTE  Subjective:  Bailey Carney is a 33 y.o. G3P1011 at [redacted]w[redacted]d being seen today for ongoing prenatal care.  She is currently monitored for the following issues for this high-risk pregnancy and has Asthma; Hypertension; History of postpartum depression; Hirsutism; Supervision of other normal pregnancy, antepartum; and Chronic hypertension on their problem list.  Patient reports vaginal discharge and odor.  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 She has also been having to work overtime passed her hours on different occasionsContractions: Not present. Vag. Bleeding: None.  Movement: Present. Denies leaking of fluid.   The following portions of the patient's history were reviewed and updated as appropriate: allergies, current medications, past family history, past medical history, past social history, past surgical history and problem list.   Objective:    Vitals:   12/25/23 1138  BP: 107/78  Pulse: 74  Weight: 248 lb 9.6 oz (112.8 kg)    Fetal Status:  Fetal Heart Rate (bpm): 153   Movement: Present    General: Alert, oriented and cooperative. Patient is in no acute distress.  Skin: Skin is warm and dry. No rash noted.   Cardiovascular: Normal heart rate noted  Respiratory: Normal respiratory effort, no problems with respiration noted  Abdomen: Soft, gravid, appropriate for gestational age.  Pain/Pressure: Present     Pelvic: Cervical exam deferred        Extremities: Normal range of motion.  Edema: Trace  Mental Status: Normal mood and affect. Normal behavior. Normal judgment and thought content.   Assessment and Plan:  Pregnancy: G3P1011 at [redacted]w[redacted]d 1. Supervision of other normal pregnancy, antepartum (Primary) BP and FHR normal Doing well, feeling regular movement '  2. Vaginal odor Swab collected today - Cervicovaginal ancillary only( [place])  3. Chronic hypertension Normotensive on amlodipine   10mg  9/4 u/s EFW 27% Weekly testing starting at 32 weeks   4. History of postpartum depression  5. [redacted] weeks gestation of pregnancy 6. Low back pain, unspecified back pain laterality, unspecified chronicity, unspecified whether sciatica present Discussed accommodations for work to include an hour lunch break to get up and move around and stretch, also to limit working hours to 8 hours so she is not sitting for extended periods of time. Continue supportive measures as well for back and pelvic pain in pregnancy.    Preterm labor symptoms and general obstetric precautions including but not limited to vaginal bleeding, contractions, leaking of fluid and fetal movement were reviewed in detail with the patient. Please refer to After Visit Summary for other counseling recommendations.   Return in about 3 weeks (around 01/15/2024) for OB VISIT (MD or APP), 2 hr GTT.  Future Appointments  Date Time Provider Department Center  01/17/2024  9:15 AM [facility] PROVIDER 1 [facility]  01/17/2024  9:30 AM [facility] US[facility]  02/13/2024 11:00 AM [facility] PROVIDER 1 [facility]  02/13/2024 11:30 AM [facility] US1 [facility]  02/20/2024 11:00 AM [facility] PROVIDER 1 [facility]  02/20/2024 11:30 AM [facility] US4 [facility]  02/27/2024  9:15 AM [facility] PROVIDER 1 [facility]  02/27/2024  9:30 AM [facility] US4 [facility]    Nidia Daring, FNP

## 2023-12-25 NOTE — Progress Notes (Signed)
 Pt presents for ROB visit. Pt c/o back and pelvic pain. Pt reports increased discharge with slight odor

## 2023-12-26 ENCOUNTER — Ambulatory Visit: Payer: Self-pay | Admitting: Obstetrics and Gynecology

## 2023-12-26 LAB — CERVICOVAGINAL ANCILLARY ONLY
Bacterial Vaginitis (gardnerella): NEGATIVE
Candida Glabrata: NEGATIVE
Candida Vaginitis: NEGATIVE
Comment: NEGATIVE
Comment: NEGATIVE
Comment: NEGATIVE

## 2023-12-29 ENCOUNTER — Other Ambulatory Visit: Payer: Self-pay

## 2023-12-29 ENCOUNTER — Encounter (HOSPITAL_COMMUNITY): Payer: Self-pay | Admitting: Obstetrics and Gynecology

## 2023-12-29 ENCOUNTER — Inpatient Hospital Stay (HOSPITAL_COMMUNITY)
Admission: AD | Admit: 2023-12-29 | Discharge: 2023-12-30 | Disposition: A | Discharge status: Home / Self Care | Attending: Obstetrics and Gynecology | Admitting: Obstetrics and Gynecology

## 2023-12-29 DIAGNOSIS — R109 Unspecified abdominal pain: Secondary | ICD-10-CM | POA: Diagnosis not present

## 2023-12-29 DIAGNOSIS — M545 Low back pain, unspecified: Secondary | ICD-10-CM | POA: Insufficient documentation

## 2023-12-29 DIAGNOSIS — Z3A26 26 weeks gestation of pregnancy: Secondary | ICD-10-CM | POA: Insufficient documentation

## 2023-12-29 DIAGNOSIS — Z3689 Encounter for other specified antenatal screening: Secondary | ICD-10-CM | POA: Insufficient documentation

## 2023-12-29 DIAGNOSIS — Z3480 Encounter for supervision of other normal pregnancy, unspecified trimester: Secondary | ICD-10-CM

## 2023-12-29 DIAGNOSIS — O26899 Other specified pregnancy related conditions, unspecified trimester: Secondary | ICD-10-CM

## 2023-12-29 DIAGNOSIS — O99891 Other specified diseases and conditions complicating pregnancy: Secondary | ICD-10-CM

## 2023-12-29 DIAGNOSIS — O368120 Decreased fetal movements, second trimester, not applicable or unspecified: Secondary | ICD-10-CM | POA: Diagnosis not present

## 2023-12-29 DIAGNOSIS — O26892 Other specified pregnancy related conditions, second trimester: Secondary | ICD-10-CM | POA: Diagnosis not present

## 2023-12-29 LAB — URINALYSIS, ROUTINE W REFLEX MICROSCOPIC
Bilirubin Urine: NEGATIVE
Glucose, UA: NEGATIVE mg/dL
Hgb urine dipstick: NEGATIVE
Ketones, ur: NEGATIVE mg/dL
Leukocytes,Ua: NEGATIVE
Nitrite: NEGATIVE
Protein, ur: NEGATIVE mg/dL
Specific Gravity, Urine: 1.025 (ref 1.005–1.030)
pH: 6 (ref 5.0–8.0)

## 2023-12-29 NOTE — MAU Note (Addendum)
..  Bailey Carney is a 33 y.o. at [redacted]w[redacted]d here in MAU reporting: no fetal movement, last time she felt baby move was last night.  Reports abdominal cramping and lower back pain that began last night. Took tylenol  around noon for the pain, but it did not help.  Denies vaginal bleeding or leaking of fluid.  Pain score: abdominal 8/10; back 10/10 Vitals:   12/29/23 2223  BP: 125/80  Pulse: 71  Resp: 14  Temp: 98.3 F (36.8 C)  SpO2: 100%     FHT:150 Lab orders placed from triage:  UA

## 2023-12-30 ENCOUNTER — Encounter (HOSPITAL_COMMUNITY): Payer: Self-pay | Admitting: Obstetrics and Gynecology

## 2023-12-30 DIAGNOSIS — R109 Unspecified abdominal pain: Secondary | ICD-10-CM | POA: Diagnosis not present

## 2023-12-30 DIAGNOSIS — Z3A26 26 weeks gestation of pregnancy: Secondary | ICD-10-CM

## 2023-12-30 DIAGNOSIS — O26892 Other specified pregnancy related conditions, second trimester: Secondary | ICD-10-CM

## 2023-12-30 NOTE — MAU Provider Note (Signed)
 History     CSN: [id number]  Arrival date and time: 12/29/23 2206   Event Date/Time   First Provider Initiated Contact with Patient 12/30/23 0004      Chief Complaint  Patient presents with   Abdominal Pain   Decreased Fetal Movement   Back Pain   HPI [name] is a 33 y.o. year old G34P1011 female at [redacted]w[redacted]d weeks gestation who presents to MAU reporting no FM since last night.  She reports she had abdominal cramping and lower back pain last night.  She rates her abdominal pain 8/10, back pain 10/10.  She took Tylenol  around noon for the pain but no help.  She denies vaginal bleeding or loss of fluid. She receives [facility] with [facility]; next appt is 01/15/2024.   OB History     Gravida  3   Para  1   Term  1   Preterm  0   AB  1   Living  1      SAB  1   IAB  0   Ectopic  0   Multiple  0   Live Births  1           Past Medical History:  Diagnosis Date   Asthma    does not use inhaler often   Chlamydia    Cholecystitis    Genital herpes    Hypertension    on meds    Past Surgical History:  Procedure Laterality Date   NO PAST SURGERIES      Family History  Problem Relation Age of Onset   Hypertension Mother    Breast cancer Maternal Grandmother    Hypertension Maternal Grandmother    Cancer Maternal Grandmother    Dementia Maternal Grandmother    Cancer Maternal Grandfather    Breast cancer Paternal Grandmother    Cancer Paternal Grandmother     Social History   Tobacco Use   Smoking status: Never   Smokeless tobacco: Never  Vaping Use   Vaping status: Never Used  Substance Use Topics   Alcohol use: Not Currently    Comment: socially   Drug use: No    Allergies:  Allergies  Allergen Reactions   Amoxicillin Rash and Other (See Comments)    Has patient had a PCN reaction causing immediate rash, facial/tongue/throat swelling, SOB or lightheadedness with hypotension: No Has patient had a PCN reaction causing severe rash  involving mucus membranes or skin necrosis: No Has patient had a PCN reaction that required hospitalization No Has patient had a PCN reaction occurring within the last 10 years: No If all of the above answers are NO, then may proceed with Cephalosporin use.     Medications Prior to Admission  Medication Sig Dispense Refill Last Dose/Taking   acetaminophen  (TYLENOL ) 325 MG tablet Take 2 tablets (650 mg total) by mouth every 6 (six) hours as needed. 30 tablet 0 12/29/2023   amLODipine  (NORVASC ) 10 MG tablet Take 1 tablet (10 mg total) by mouth daily. 30 tablet 6 12/29/2023   aspirin  EC 81 MG tablet Take 2 tablets (162 mg total) by mouth daily. Start taking when you are [redacted] weeks pregnant for rest of pregnancy for prevention of preeclampsia 300 tablet 2 12/29/2023   Prenatal Vit-Fe Fumarate-FA (PRENATAL VITAMIN PO) Take 1 tablet by mouth daily. 2 gummies   12/29/2023   albuterol  (PROVENTIL  HFA;VENTOLIN  HFA) 108 (90 Base) MCG/ACT inhaler Inhale 1-2 puffs into the lungs every 6 (six) hours as  needed for wheezing or shortness of breath. 1 Inhaler 0 More than a month   amLODipine  (NORVASC ) 10 MG tablet Take by mouth. (Patient not taking: Reported on 12/29/2023)   Not Taking   Blood Pressure Monitoring (BLOOD PRESSURE KIT) DEVI 1 Device by Does not apply route once a week. (Patient not taking: Reported on 10/23/2023) 1 each 0    cyclobenzaprine  (FLEXERIL ) 10 MG tablet Take 1 tablet (10 mg total) by mouth 2 (two) times daily as needed for muscle spasms. (Patient not taking: Reported on 12/25/2023) 20 tablet 0    Doxylamine -Pyridoxine  (DICLEGIS ) 10-10 MG TBEC Take 2 tablets by mouth at bedtime. If symptoms persist, add one tablet in the morning and one in the afternoon (Patient not taking: Reported on 12/25/2023) 100 tablet 5    famotidine  (PEPCID ) 40 MG tablet Take 1 tablet (40 mg total) by mouth daily. (Patient not taking: Reported on 12/25/2023) 90 tablet 3    metoCLOPramide  (REGLAN ) 10 MG tablet Take 1 tablet  (10 mg total) by mouth every 6 (six) hours. (Patient not taking: Reported on 12/25/2023) 30 tablet 0    ondansetron  (ZOFRAN -ODT) 4 MG disintegrating tablet Take 1 tablet (4 mg total) by mouth every 6 (six) hours as needed for nausea. (Patient not taking: Reported on 12/25/2023) 20 tablet 2    Prenatal Vit-Fe Phos-FA-Omega (VITAFOL  GUMMIES) 3.33-0.333-34.8 MG CHEW Chew 3 tablets by mouth daily. (Patient not taking: Reported on 12/25/2023) 90 tablet 5    promethazine  (PHENERGAN ) 25 MG tablet Take 1 tablet (25 mg total) by mouth every 6 (six) hours as needed for nausea or vomiting. (Patient not taking: Reported on 12/25/2023) 30 tablet 1     Review of Systems  Constitutional: Negative.   HENT: Negative.    Eyes: Negative.   Respiratory: Negative.    Cardiovascular: Negative.   Gastrointestinal: Negative.   Endocrine: Negative.   Genitourinary:  Positive for pelvic pain (lower abd cramping).  Musculoskeletal:  Positive for back pain.  Skin: Negative.   Allergic/Immunologic: Negative.   Neurological: Negative.   Hematological: Negative.   Psychiatric/Behavioral: Negative.     Physical Exam   Blood pressure 125/80, pulse 71, temperature 98.3 F (36.8 C), temperature source Oral, resp. rate 14, height 5' 7 (1.702 m), weight 113.9 kg, last menstrual period 06/22/2023, SpO2 100%.  Physical Exam Vitals and nursing note reviewed.  Constitutional:      Appearance: Normal appearance. She is obese.  Cardiovascular:     Rate and Rhythm: Normal rate.  Pulmonary:     Effort: Pulmonary effort is normal.  Abdominal:     Palpations: Abdomen is soft.  Genitourinary:    Comments: deferred Musculoskeletal:        General: Normal range of motion.  Skin:    General: Skin is warm and dry.  Neurological:     Mental Status: She is alert and oriented to person, place, and time.  Psychiatric:        Mood and Affect: Mood normal.        Behavior: Behavior normal.        Thought Content: Thought content  normal.        Judgment: Judgment normal.    REACTIVE NST - FHR: 145 bpm / moderate variability / accels present / decels absent / TOCO: UI noted  MAU Course  Procedures  MDM CCUA EFM Offered Flexeril  or Tylenol  -- patient declined  Results for orders placed or performed during the hospital encounter of 12/29/23 (from the past 24  hours)  Urinalysis, Routine w reflex microscopic -Urine, Clean Catch     Status: None   Collection Time: 12/29/23 10:35 PM  Result Value Ref Range   Color, Urine YELLOW YELLOW   APPearance CLEAR CLEAR   Specific Gravity, Urine 1.025 1.005 - 1.030   pH 6.0 5.0 - 8.0   Glucose, UA NEGATIVE NEGATIVE mg/dL   Hgb urine dipstick NEGATIVE NEGATIVE   Bilirubin Urine NEGATIVE NEGATIVE   Ketones, ur NEGATIVE NEGATIVE mg/dL   Protein, ur NEGATIVE NEGATIVE mg/dL   Nitrite NEGATIVE NEGATIVE   Leukocytes,Ua NEGATIVE NEGATIVE    Assessment and Plan  1. Abdominal cramping complicating pregnancy (Primary) - Information provided on abdominal cramping in pregnancy - Information on maternity support belt given   2. Back pain affecting pregnancy in second trimester - Information provided on back pain in pregnancy   3. NST (non-stress test) reactive - Advised that FHR is normal  4. [redacted] weeks gestation of pregnancy   - Discharge home - Keep scheduled appt [facility] 01/15/2024 - Patient verbalized an understanding of the plan of care and agrees.   Ala Cart, CNM 12/30/2023, 12:10 AM

## 2023-12-30 NOTE — Discharge Instructions (Signed)
   PREGNANCY SUPPORT BELT: You are not alone, Seventy-five percent of women have some sort of abdominal or back pain at some point in their pregnancy. Your baby is growing at a fast pace, which means that your whole body is rapidly trying to adjust to the changes. As your uterus grows, your back may start feeling a bit under stress and this can result in back or abdominal pain that can go from mild, and therefore bearable, to severe pains that will not allow you to sit or lay down comfortably, When it comes to dealing with pregnancy-related pains and cramps, some pregnant women usually prefer natural remedies, which the market is filled with nowadays. For example, wearing a pregnancy support belt can help ease and lessen your discomfort and pain.  WHAT ARE THE BENEFITS OF WEARING A PREGNANCY SUPPORT BELT? A pregnancy support belt provides support to the lower portion of the belly taking some of the weight of the growing uterus and distributing to the other parts of your body. It is designed make you comfortable and gives you extra support. Over the years, the pregnancy apparel market has been studying the needs and wants of pregnant women and they have come up with the most comfortable pregnancy support belts that woman could ever ask for. In fact, you will no longer have to wear a stretched-out or bulky pregnancy belt that is visible underneath your clothes and makes you feel even more uncomfortable. Nowadays, a pregnancy support belt is made of comfortable and stretchy materials that will not irritate your skin but will actually make you feel at ease and you will not even notice you are wearing it. They are easy to put on and adjust during the day and can be worn at night for additional support.  BENEFITS: Relives Back pain Relieves Abdominal Muscle and Leg Pain Stabilizes the Pelvic Ring Offers a Cushioned Abdominal Lift Pad Relieves pressure on the Sciatic Nerve Within Minutes WHERE TO GET YOUR  Pregnancy Belt/Maternity Belt/Belly Band:   [employer]

## 2024-01-07 ENCOUNTER — Encounter: Admitting: Obstetrics & Gynecology

## 2024-01-08 DIAGNOSIS — O99210 Obesity complicating pregnancy, unspecified trimester: Secondary | ICD-10-CM | POA: Insufficient documentation

## 2024-01-15 ENCOUNTER — Other Ambulatory Visit

## 2024-01-15 ENCOUNTER — Ambulatory Visit (INDEPENDENT_AMBULATORY_CARE_PROVIDER_SITE_OTHER): Payer: Self-pay | Admitting: Obstetrics and Gynecology

## 2024-01-15 ENCOUNTER — Encounter: Payer: Self-pay | Admitting: Obstetrics and Gynecology

## 2024-01-15 VITALS — BP 119/82 | HR 81 | Wt 252.0 lb

## 2024-01-15 DIAGNOSIS — O99210 Obesity complicating pregnancy, unspecified trimester: Secondary | ICD-10-CM

## 2024-01-15 DIAGNOSIS — Z8659 Personal history of other mental and behavioral disorders: Secondary | ICD-10-CM

## 2024-01-15 DIAGNOSIS — Z3A28 28 weeks gestation of pregnancy: Secondary | ICD-10-CM

## 2024-01-15 DIAGNOSIS — O99213 Obesity complicating pregnancy, third trimester: Secondary | ICD-10-CM | POA: Diagnosis not present

## 2024-01-15 DIAGNOSIS — I10 Essential (primary) hypertension: Secondary | ICD-10-CM

## 2024-01-15 DIAGNOSIS — O10013 Pre-existing essential hypertension complicating pregnancy, third trimester: Secondary | ICD-10-CM | POA: Diagnosis not present

## 2024-01-15 DIAGNOSIS — Z3480 Encounter for supervision of other normal pregnancy, unspecified trimester: Secondary | ICD-10-CM

## 2024-01-15 DIAGNOSIS — Z8759 Personal history of other complications of pregnancy, childbirth and the puerperium: Secondary | ICD-10-CM | POA: Diagnosis not present

## 2024-01-15 NOTE — Progress Notes (Addendum)
 ROB/GTT. Pt declined TDAP and FLU vaccine. C/o tingling on right side of her face, sharp shooting pain 7/10 in jaw line x 3 weeks.

## 2024-01-15 NOTE — Progress Notes (Signed)
   PRENATAL VISIT NOTE  Subjective:  Bailey Carney is a 33 y.o. G3P1011 at [redacted]w[redacted]d being seen today for ongoing prenatal care.  She is currently monitored for the following issues for this high-risk pregnancy and has Asthma; Hypertension; History of postpartum depression; Hirsutism; Supervision of other normal pregnancy, antepartum; Chronic hypertension; and Obesity affecting pregnancy, antepartum on their problem list.  Patient reports no complaints.  Contractions: Irritability. Vag. Bleeding: None.  Movement: Present. Denies leaking of fluid.   The following portions of the patient's history were reviewed and updated as appropriate: allergies, current medications, past family history, past medical history, past social history, past surgical history and problem list.   Objective:    Vitals:   01/15/24 0910  BP: 119/82  Pulse: 81  Weight: 252 lb (114.3 kg)    Fetal Status:  Fetal Heart Rate (bpm): 159 Fundal Height: 28 cm Movement: Present    General: Alert, oriented and cooperative. Patient is in no acute distress.  Skin: Skin is warm and dry. No rash noted.   Cardiovascular: Normal heart rate noted  Respiratory: Normal respiratory effort, no problems with respiration noted  Abdomen: Soft, gravid, appropriate for gestational age.  Pain/Pressure: Present     Pelvic: Cervical exam deferred        Extremities: Normal range of motion.  Edema: Trace  Mental Status: Normal mood and affect. Normal behavior. Normal judgment and thought content.   Assessment and Plan:  Pregnancy: G3P1011 at [redacted]w[redacted]d 1. Supervision of other normal pregnancy, antepartum (Primary) Patient is doing well  Third trimester labs and glucola today Patient declined flu and tdap Patient plans Nexplanon for contraception - Glucose Tolerance, 2 Hours w/1 Hour - RPR - CBC - HIV antibody (with reflex)  2. [redacted] weeks gestation of pregnancy - Glucose Tolerance, 2 Hours w/1 Hour - RPR - CBC - HIV antibody (with  reflex)  3. Chronic hypertension BP well controlled Continue Norvasc  and ASA Follow up growth ultrasound 10/3  4. Obesity affecting pregnancy, antepartum, unspecified obesity type Discussed excessive weight gain thus far  5. History of postpartum depression Stable   Preterm labor symptoms and general obstetric precautions including but not limited to vaginal bleeding, contractions, leaking of fluid and fetal movement were reviewed in detail with the patient. Please refer to After Visit Summary for other counseling recommendations.   Return in about 2 weeks (around 01/29/2024) for in person, ROB, High risk.  Future Appointments  Date Time Provider Department Center  01/15/2024  9:35 AM Lettie Czarnecki, [place], MD [facility] None  01/17/2024  9:15 AM [facility] PROVIDER 1 [facility]  01/17/2024  9:30 AM [facility] US3 [facility]  02/13/2024 11:00 AM [facility] PROVIDER 1 [facility]  02/13/2024 11:30 AM [facility] US1 [facility]  02/20/2024 11:00 AM [facility] PROVIDER 1 [facility]  02/20/2024 11:30 AM [facility] US4 [facility]  02/27/2024  9:15 AM [facility] PROVIDER 1 [facility]  02/27/2024  9:30 AM [facility] US4 [facility]    Seymone Forlenza, MD

## 2024-01-16 ENCOUNTER — Ambulatory Visit

## 2024-01-16 ENCOUNTER — Other Ambulatory Visit

## 2024-01-16 LAB — GLUCOSE TOLERANCE, 2 HOURS W/ 1HR
Glucose, 1 hour: 106 mg/dL (ref 70–179)
Glucose, 2 hour: 72 mg/dL (ref 70–152)
Glucose, Fasting: 75 mg/dL (ref 70–91)

## 2024-01-16 LAB — CBC
Hematocrit: 35.4 % (ref 34.0–46.6)
Hemoglobin: 11.9 g/dL (ref 11.1–15.9)
MCH: 30.4 pg (ref 26.6–33.0)
MCHC: 33.6 g/dL (ref 31.5–35.7)
MCV: 90 fL (ref 79–97)
Platelets: 169 x10E3/uL (ref 150–450)
RBC: 3.92 x10E6/uL (ref 3.77–5.28)
RDW: 12.2 % (ref 11.7–15.4)
WBC: 10.4 x10E3/uL (ref 3.4–10.8)

## 2024-01-16 LAB — RPR: RPR Ser Ql: NONREACTIVE

## 2024-01-16 LAB — HIV ANTIBODY (ROUTINE TESTING W REFLEX): HIV Screen 4th Generation wRfx: NONREACTIVE

## 2024-01-17 ENCOUNTER — Ambulatory Visit (HOSPITAL_BASED_OUTPATIENT_CLINIC_OR_DEPARTMENT_OTHER): Admitting: Obstetrics

## 2024-01-17 ENCOUNTER — Ambulatory Visit: Attending: Obstetrics and Gynecology

## 2024-01-17 VITALS — BP 124/82 | HR 94

## 2024-01-17 DIAGNOSIS — E669 Obesity, unspecified: Secondary | ICD-10-CM | POA: Diagnosis not present

## 2024-01-17 DIAGNOSIS — Z3A28 28 weeks gestation of pregnancy: Secondary | ICD-10-CM

## 2024-01-17 DIAGNOSIS — O10913 Unspecified pre-existing hypertension complicating pregnancy, third trimester: Secondary | ICD-10-CM

## 2024-01-17 DIAGNOSIS — O99210 Obesity complicating pregnancy, unspecified trimester: Secondary | ICD-10-CM | POA: Insufficient documentation

## 2024-01-17 DIAGNOSIS — O99213 Obesity complicating pregnancy, third trimester: Secondary | ICD-10-CM

## 2024-01-17 DIAGNOSIS — I10 Essential (primary) hypertension: Secondary | ICD-10-CM | POA: Insufficient documentation

## 2024-01-17 DIAGNOSIS — O10013 Pre-existing essential hypertension complicating pregnancy, third trimester: Secondary | ICD-10-CM

## 2024-01-17 DIAGNOSIS — J45909 Unspecified asthma, uncomplicated: Secondary | ICD-10-CM

## 2024-01-17 DIAGNOSIS — O403XX0 Polyhydramnios, third trimester, not applicable or unspecified: Secondary | ICD-10-CM

## 2024-01-17 DIAGNOSIS — O99513 Diseases of the respiratory system complicating pregnancy, third trimester: Secondary | ICD-10-CM

## 2024-01-17 NOTE — Progress Notes (Signed)
 MFM Consult Note  Bailey Carney is currently at 28 weeks and 5 days.  She has been followed due to maternal obesity with a BMI of 36.7 and chronic hypertension treated with Norvasc .    She denies any problems since her last exam and has screened negative for gestational diabetes.  Her blood pressure today was 124/82.  Sonographic findings Single intrauterine pregnancy at 28w 5d.  Fetal cardiac activity:  Observed and appears normal. Presentation: Cephalic. Interval fetal anatomy appears normal. Fetal biometry shows the estimated fetal weight of 2 pounds 14 ounces at the 41st percentile. Amniotic fluid volume: Polyhydramnios. MVP: 9.73 cm. Placenta: Posterior.  There are limitations of prenatal ultrasound such as the inability to detect certain abnormalities due to poor visualization. Various factors such as fetal position, gestational age and maternal body habitus may increase the difficulty in visualizing the fetal anatomy.    Due to chronic hypertension, we will continue to follow her with growth ultrasounds throughout her pregnancy.    Weekly fetal testing will be started at around 32 weeks.    Due to chronic hypertension and maternal obesity, delivery should be considered at between 38 to 39 weeks.  The patient will discuss setting up a delivery date at her next prenatal visit so that she can make childcare plans for her other child.  She will return in 4 weeks for another growth scan.    The patient stated that all of her questions were answered today.  A total of 20 minutes was spent counseling and coordinating the care for this patient.  Greater than 50% of the time was spent in direct face-to-face contact.

## 2024-01-25 ENCOUNTER — Encounter (HOSPITAL_COMMUNITY): Payer: Self-pay | Admitting: Obstetrics

## 2024-01-25 ENCOUNTER — Other Ambulatory Visit: Payer: Self-pay

## 2024-01-25 ENCOUNTER — Inpatient Hospital Stay (HOSPITAL_COMMUNITY)
Admission: AD | Admit: 2024-01-25 | Discharge: 2024-01-25 | Disposition: A | Discharge status: Home / Self Care | Attending: Obstetrics | Admitting: Obstetrics

## 2024-01-25 DIAGNOSIS — O26893 Other specified pregnancy related conditions, third trimester: Secondary | ICD-10-CM | POA: Insufficient documentation

## 2024-01-25 DIAGNOSIS — O10913 Unspecified pre-existing hypertension complicating pregnancy, third trimester: Secondary | ICD-10-CM | POA: Diagnosis not present

## 2024-01-25 DIAGNOSIS — M549 Dorsalgia, unspecified: Secondary | ICD-10-CM | POA: Diagnosis not present

## 2024-01-25 DIAGNOSIS — Z3A29 29 weeks gestation of pregnancy: Secondary | ICD-10-CM

## 2024-01-25 DIAGNOSIS — O98513 Other viral diseases complicating pregnancy, third trimester: Secondary | ICD-10-CM

## 2024-01-25 DIAGNOSIS — R519 Headache, unspecified: Secondary | ICD-10-CM | POA: Diagnosis not present

## 2024-01-25 DIAGNOSIS — O10919 Unspecified pre-existing hypertension complicating pregnancy, unspecified trimester: Secondary | ICD-10-CM

## 2024-01-25 DIAGNOSIS — A084 Viral intestinal infection, unspecified: Secondary | ICD-10-CM | POA: Diagnosis not present

## 2024-01-25 DIAGNOSIS — O99613 Diseases of the digestive system complicating pregnancy, third trimester: Secondary | ICD-10-CM | POA: Diagnosis not present

## 2024-01-25 DIAGNOSIS — O99891 Other specified diseases and conditions complicating pregnancy: Secondary | ICD-10-CM

## 2024-01-25 LAB — COMPREHENSIVE METABOLIC PANEL WITH GFR
ALT: 20 U/L (ref 0–44)
AST: 20 U/L (ref 15–41)
Albumin: 2.6 g/dL — ABNORMAL LOW (ref 3.5–5.0)
Alkaline Phosphatase: 67 U/L (ref 38–126)
Anion gap: 11 (ref 5–15)
BUN: 5 mg/dL — ABNORMAL LOW (ref 6–20)
CO2: 19 mmol/L — ABNORMAL LOW (ref 22–32)
Calcium: 8.4 mg/dL — ABNORMAL LOW (ref 8.9–10.3)
Chloride: 105 mmol/L (ref 98–111)
Creatinine, Ser: 0.48 mg/dL (ref 0.44–1.00)
GFR, Estimated: >60 mL/min (ref >60)
Glucose, Bld: 80 mg/dL (ref 70–99)
Potassium: 3.8 mmol/L (ref 3.5–5.1)
Sodium: 135 mmol/L (ref 135–145)
Total Bilirubin: 0.3 mg/dL (ref 0.0–1.2)
Total Protein: 6 g/dL — ABNORMAL LOW (ref 6.5–8.1)

## 2024-01-25 LAB — CBC WITH DIFFERENTIAL/PLATELET
Abs Immature Granulocytes: 0.04 K/uL (ref 0.00–0.07)
Basophils Absolute: 0 K/uL (ref 0.0–0.1)
Basophils Relative: 0 %
Eosinophils Absolute: 0 K/uL (ref 0.0–0.5)
Eosinophils Relative: 0 %
HCT: 33.8 % — ABNORMAL LOW (ref 36.0–46.0)
Hemoglobin: 11.5 g/dL — ABNORMAL LOW (ref 12.0–15.0)
Immature Granulocytes: 0 %
Lymphocytes Relative: 21 %
Lymphs Abs: 2.2 K/uL (ref 0.7–4.0)
MCH: 29.9 pg (ref 26.0–34.0)
MCHC: 34 g/dL (ref 30.0–36.0)
MCV: 87.8 fL (ref 80.0–100.0)
Monocytes Absolute: 0.7 K/uL (ref 0.1–1.0)
Monocytes Relative: 7 %
Neutro Abs: 7.6 K/uL (ref 1.7–7.7)
Neutrophils Relative %: 72 %
Platelets: 162 K/uL (ref 150–400)
RBC: 3.85 MIL/uL — ABNORMAL LOW (ref 3.87–5.11)
RDW: 12.2 % (ref 11.5–15.5)
WBC: 10.6 K/uL — ABNORMAL HIGH (ref 4.0–10.5)
nRBC: 0 % (ref 0.0–0.2)

## 2024-01-25 LAB — URINALYSIS, ROUTINE W REFLEX MICROSCOPIC
Bilirubin Urine: NEGATIVE
Glucose, UA: NEGATIVE mg/dL
Hgb urine dipstick: NEGATIVE
Ketones, ur: NEGATIVE mg/dL
Leukocytes,Ua: NEGATIVE
Nitrite: NEGATIVE
Protein, ur: NEGATIVE mg/dL
Specific Gravity, Urine: 1.013 (ref 1.005–1.030)
pH: 7 (ref 5.0–8.0)

## 2024-01-25 LAB — PROTEIN / CREATININE RATIO, URINE
Creatinine, Urine: 88 mg/dL
Protein Creatinine Ratio: 0.13 mg/mg{creat} (ref 0.00–0.15)
Total Protein, Urine: 11 mg/dL

## 2024-01-25 LAB — MAGNESIUM: Magnesium: 1.7 mg/dL (ref 1.7–2.4)

## 2024-01-25 MED ORDER — LACTATED RINGERS IV BOLUS
1000.0000 mL | Freq: Once | INTRAVENOUS | Status: AC
  Administered 2024-01-25: 1000 mL via INTRAVENOUS

## 2024-01-25 MED ORDER — ONDANSETRON HCL 4 MG/2ML IJ SOLN
4.0000 mg | Freq: Once | INTRAMUSCULAR | Status: AC
  Administered 2024-01-25: 4 mg via INTRAVENOUS
  Filled 2024-01-25: qty 2

## 2024-01-25 MED ORDER — ACETAMINOPHEN 500 MG PO TABS
1000.0000 mg | ORAL_TABLET | Freq: Once | ORAL | Status: AC
  Administered 2024-01-25: 1000 mg via ORAL
  Filled 2024-01-25: qty 2

## 2024-01-25 NOTE — MAU Note (Signed)
 Bailey Carney is a 33 y.o. at [redacted]w[redacted]d here in MAU reporting: ongoing pain all week Pelvic pain, lower abdominal cramping, irregular ctxs, and back pain. Reports a HA for 4 days along with nausea/vomiting and no appetite. States she is having diarrhea/loose stools every day. Denies any fever. States the pain is constant but increases in intensity at times. Denies any VB or LOF. Report +FM. Denies any recent sexual intercourse. Patient denies any unusual vaginal discharge, vaginal odor, itching or pain.  Patient denies any pain with urination or urinary frequency. Has not taken any medication today, has tried tylenol  with no relief.   Onset of complaint: 1 week Pain score: 8 Vitals:   01/25/24 1734  BP: 121/73  Pulse: 95  Resp: 16  Temp: 98 F (36.7 C)  SpO2: 98%     FHT:148 Lab orders placed from triage:  UA

## 2024-01-25 NOTE — MAU Note (Cosign Needed)
 Maternal Assessment Unit Provider Note  Subjective: [name] is a 33 y.o. G41P1011 pregnant female at [redacted]w[redacted]d who presents to MAU today with multiple complaints including diarrhea, nausea, decreased appetite and musculoskeletal back pain.   She reports she has not been feeling well with some back pain for 2 weeks.  6 days ago she started having some nausea.  5 days prior to presentation she started having some diarrhea.  1 bowel movement so far today.  She also vomited once today.  She reports decreased appetite and p.o. intake of fluids and solids.  No known sick contacts.  No fever.  She also reports 5 out of 10 headache that tends to come and go.  Frontal pressure-like quality.  She has taken acetaminophen  or Tylenol  PM with minimal relief earlier this week.  The back pain is located in the low back.  She has pelvic pain with standing and walking.   She also notes intermittent contractions but nothing consistent.  No contractions currently.  Fetal movement is at baseline.  No change in pregnancy discharge which she previously has had tested for infection with swabs.  No increased frequency or dysuria. No vaginal bleeding, no leaking fluid.  Receives care at [facility]. Prenatal records reviewed.  She has chronic hypertension on amlodipine .  Blood pressure in the MAU is normal.    Pertinent items noted in HPI and remainder of comprehensive ROS otherwise negative.   Objective: BP 118/67 (BP Location: Right Arm)   Pulse 72   Temp 98.7 F (37.1 C) (Oral)   Resp 18   Ht 5' 7 (1.702 m)   Wt 114.6 kg   LMP 06/22/2023 (Exact Date)   SpO2 98%   BMI 39.56 kg/m  Physical Exam Vitals reviewed.  Constitutional:      General: She is not in acute distress.    Appearance: Normal appearance. She is well-developed. She is not diaphoretic.  HENT:     Head: Normocephalic and atraumatic.     Mouth/Throat:     Mouth: Mucous membranes are dry.  Eyes:     General: No scleral  icterus. Cardiovascular:     Rate and Rhythm: Normal rate and regular rhythm.  Pulmonary:     Effort: Pulmonary effort is normal. No respiratory distress.     Breath sounds: Normal breath sounds.  Abdominal:     General: There is no distension.     Palpations: Abdomen is soft.     Tenderness: There is no abdominal tenderness. There is no right CVA tenderness, left CVA tenderness, guarding or rebound.  Musculoskeletal:     Lumbar back: Tenderness present.       Back:  Skin:    General: Skin is warm and dry.  Neurological:     General: No focal deficit present.     Mental Status: She is alert.    Results for orders placed or performed during the hospital encounter of 01/25/24 (from the past 24 hours)  Comprehensive metabolic panel     Status: Abnormal   Collection Time: 01/25/24  6:10 PM  Result Value Ref Range   Sodium 135 135 - 145 mmol/L   Potassium 3.8 3.5 - 5.1 mmol/L   Chloride 105 98 - 111 mmol/L   CO2 19 (L) 22 - 32 mmol/L   Glucose, Bld 80 70 - 99 mg/dL   BUN 5 (L) 6 - 20 mg/dL   Creatinine, Ser 9.51 0.44 - 1.00 mg/dL   Calcium 8.4 (L) 8.9 - 10.3  mg/dL   Total Protein 6.0 (L) 6.5 - 8.1 g/dL   Albumin 2.6 (L) 3.5 - 5.0 g/dL   AST 20 15 - 41 U/L   ALT 20 0 - 44 U/L   Alkaline Phosphatase 67 38 - 126 U/L   Total Bilirubin 0.3 0.0 - 1.2 mg/dL   GFR, Estimated >39 >39 mL/min   Anion gap 11 5 - 15  CBC with Differential/Platelet     Status: Abnormal   Collection Time: 01/25/24  6:10 PM  Result Value Ref Range   WBC 10.6 (H) 4.0 - 10.5 K/uL   RBC 3.85 (L) 3.87 - 5.11 MIL/uL   Hemoglobin 11.5 (L) 12.0 - 15.0 g/dL   HCT 66.1 (L) 63.9 - 53.9 %   MCV 87.8 80.0 - 100.0 fL   MCH 29.9 26.0 - 34.0 pg   MCHC 34.0 30.0 - 36.0 g/dL   RDW 87.7 88.4 - 84.4 %   Platelets 162 150 - 400 K/uL   nRBC 0.0 0.0 - 0.2 %   Neutrophils Relative % 72 %   Neutro Abs 7.6 1.7 - 7.7 K/uL   Lymphocytes Relative 21 %   Lymphs Abs 2.2 0.7 - 4.0 K/uL   Monocytes Relative 7 %   Monocytes  Absolute 0.7 0.1 - 1.0 K/uL   Eosinophils Relative 0 %   Eosinophils Absolute 0.0 0.0 - 0.5 K/uL   Basophils Relative 0 %   Basophils Absolute 0.0 0.0 - 0.1 K/uL   Immature Granulocytes 0 %   Abs Immature Granulocytes 0.04 0.00 - 0.07 K/uL  Magnesium     Status: None   Collection Time: 01/25/24  6:10 PM  Result Value Ref Range   Magnesium 1.7 1.7 - 2.4 mg/dL  Urinalysis, Routine w reflex microscopic -Urine, Clean Catch     Status: None   Collection Time: 01/25/24  7:15 PM  Result Value Ref Range   Color, Urine YELLOW YELLOW   APPearance CLEAR CLEAR   Specific Gravity, Urine 1.013 1.005 - 1.030   pH 7.0 5.0 - 8.0   Glucose, UA NEGATIVE NEGATIVE mg/dL   Hgb urine dipstick NEGATIVE NEGATIVE   Bilirubin Urine NEGATIVE NEGATIVE   Ketones, ur NEGATIVE NEGATIVE mg/dL   Protein, ur NEGATIVE NEGATIVE mg/dL   Nitrite NEGATIVE NEGATIVE   Leukocytes,Ua NEGATIVE NEGATIVE  Protein / creatinine ratio, urine     Status: None   Collection Time: 01/25/24  7:15 PM  Result Value Ref Range   Creatinine, Urine 88 mg/dL   Total Protein, Urine 11 mg/dL   Protein Creatinine Ratio 0.13 0.00 - 0.15 mg/mg[Cre]    MDM: moderate risk  MAU Course:    LR 1L bolus, zofran  ordered. Patient assessed. 1g acetaminophen  ordered.   Time: 38 FHT: baseline 140 bpm, moderate variability, + 15x15 accelerations, small variable decelerations appropriate for GA, reactive/reassuring   Contractions: none  Labs are negative for preeclampsia or electrolyte derangement.  UA negative for infection. upon patient reassessment, she reports headache has resolved, nausea improved, back pain is about the same.  She was offered muscle relaxant, but declined.  Discussed that likely her symptoms are the result of a viral gastroenteritis as well as some musculoskeletal pains related to pregnancy.  She has nausea meds at home available as well.  Discussed stretches, belly band, and muscle relaxant for musculoskeletal pregnancy  related pain.   Questions were answered to the satisfaction of the patient and/or family prior to discharge.   Assessment Medical screening exam complete  ICD-10-CM   1. Viral gastroenteritis  A08.4     2. Back pain affecting pregnancy  O99.891    M54.9     3. Pregnancy headache in third trimester  O26.893    R51.9     4. HTN in pregnancy, chronic  O10.919       Plan -Status post rehydration with 1 L LR bolus - Fetal wellbeing-fetal heart tones reactive/reassuring -Trial home muscle relaxant, belly band, stretches for musculoskeletal related pregnancy pain  Discharge from MAU in stable condition with strict return and preterm labor precautions Follow up at [facility] as scheduled for ongoing prenatal care  Allergies as of 01/25/2024       Reactions   Amoxicillin Rash, Other (See Comments)   Has patient had a PCN reaction causing immediate rash, facial/tongue/throat swelling, SOB or lightheadedness with hypotension: No Has patient had a PCN reaction causing severe rash involving mucus membranes or skin necrosis: No Has patient had a PCN reaction that required hospitalization No Has patient had a PCN reaction occurring within the last 10 years: No If all of the above answers are NO, then may proceed with Cephalosporin use.        Medication List     STOP taking these medications    Doxylamine -Pyridoxine  10-10 MG Tbec Commonly known as: Diclegis    famotidine  40 MG tablet Commonly known as: Pepcid        TAKE these medications    acetaminophen  325 MG tablet Commonly known as: TYLENOL  Take 2 tablets (650 mg total) by mouth every 6 (six) hours as needed.   albuterol  108 (90 Base) MCG/ACT inhaler Commonly known as: VENTOLIN  HFA Inhale 1-2 puffs into the lungs every 6 (six) hours as needed for wheezing or shortness of breath.   amLODipine  10 MG tablet Commonly known as: Norvasc  Take 1 tablet (10 mg total) by mouth daily. What changed: Another medication  with the same name was removed. Continue taking this medication, and follow the directions you see here.   aspirin  EC 81 MG tablet Take 2 tablets (162 mg total) by mouth daily. Start taking when you are [redacted] weeks pregnant for rest of pregnancy for prevention of preeclampsia   Blood Pressure Kit Devi 1 Device by Does not apply route once a week.   cyclobenzaprine  10 MG tablet Commonly known as: FLEXERIL  Take 1 tablet (10 mg total) by mouth 2 (two) times daily as needed for muscle spasms.   metoCLOPramide  10 MG tablet Commonly known as: REGLAN  Take 1 tablet (10 mg total) by mouth every 6 (six) hours.   ondansetron  4 MG disintegrating tablet Commonly known as: ZOFRAN -ODT Take 1 tablet (4 mg total) by mouth every 6 (six) hours as needed for nausea.   PRENATAL VITAMIN PO Take 1 tablet by mouth daily. 2 gummies   promethazine  25 MG tablet Commonly known as: PHENERGAN  Take 1 tablet (25 mg total) by mouth every 6 (six) hours as needed for nausea or vomiting.   Vitafol  Gummies 3.33-0.333-34.8 MG Chew Chew 3 tablets by mouth daily.        Trudy Leeroy NOVAK, MD 01/25/2024 9:10 PM

## 2024-01-25 NOTE — Discharge Instructions (Signed)

## 2024-02-03 ENCOUNTER — Encounter: Payer: Self-pay | Admitting: Obstetrics and Gynecology

## 2024-02-03 ENCOUNTER — Ambulatory Visit (INDEPENDENT_AMBULATORY_CARE_PROVIDER_SITE_OTHER): Admitting: Obstetrics and Gynecology

## 2024-02-03 VITALS — BP 114/78 | HR 92 | Wt 260.0 lb

## 2024-02-03 DIAGNOSIS — Z3A31 31 weeks gestation of pregnancy: Secondary | ICD-10-CM | POA: Diagnosis not present

## 2024-02-03 DIAGNOSIS — Z8759 Personal history of other complications of pregnancy, childbirth and the puerperium: Secondary | ICD-10-CM | POA: Diagnosis not present

## 2024-02-03 DIAGNOSIS — I10 Essential (primary) hypertension: Secondary | ICD-10-CM

## 2024-02-03 DIAGNOSIS — Z3480 Encounter for supervision of other normal pregnancy, unspecified trimester: Secondary | ICD-10-CM

## 2024-02-03 DIAGNOSIS — Z8659 Personal history of other mental and behavioral disorders: Secondary | ICD-10-CM

## 2024-02-03 DIAGNOSIS — O403XX0 Polyhydramnios, third trimester, not applicable or unspecified: Secondary | ICD-10-CM | POA: Diagnosis not present

## 2024-02-03 NOTE — Progress Notes (Signed)
 Pt presents for ROB visit. Pt c/o decreased movement over the last 3 days, swelling in the feet and hands and back pain that radiates down the left leg. Pt c/o headaches not alleviated with Tylenol .

## 2024-02-03 NOTE — Progress Notes (Signed)
   PRENATAL VISIT NOTE  Subjective:  Bailey Carney is a 33 y.o. G3P1011 at [redacted]w[redacted]d being seen today for ongoing prenatal care.  She is currently monitored for the following issues for this high-risk pregnancy and has Asthma; Hypertension; History of postpartum depression; Hirsutism; Supervision of other normal pregnancy, antepartum; Chronic hypertension; and Obesity affecting pregnancy, antepartum on their problem list.  Patient reports feels like the movement has decreased, still feeling movement but not as much as normal. Reports headache unresolved by tylenol . Reports increase in swelling in hands and feet  .  Contractions: Irritability.  .  Movement: (!) Decreased. Denies leaking of fluid.   The following portions of the patient's history were reviewed and updated as appropriate: allergies, current medications, past family history, past medical history, past social history, past surgical history and problem list.   Objective:    Vitals:   02/03/24 1120  BP: 114/78  Pulse: 92  Weight: 260 lb (117.9 kg)    Fetal Status:  Fetal Heart Rate (bpm): 150   Movement: (!) Decreased    General: Alert, oriented and cooperative. Patient is in no acute distress.  Skin: Skin is warm and dry. No rash noted.   Cardiovascular: Normal heart rate noted  Respiratory: Normal respiratory effort, no problems with respiration noted  Abdomen: Soft, gravid, appropriate for gestational age.  Pain/Pressure: Present     Pelvic: Cervical exam deferred        Extremities: Normal range of motion.  Edema: Trace  Mental Status: Normal mood and affect. Normal behavior. Normal judgment and thought content.   Assessment and Plan:  Pregnancy: G3P1011 at [redacted]w[redacted]d 1. Supervision of other normal pregnancy, antepartum (Primary) BP and FHR normal   2. Chronic hypertension On norvasc  10mg , normotensive  Had discussed with MFM scheduling IOL, discussed will schedule, but could change based on BP control and u/s    3.  History of postpartum depression   4. [redacted] weeks gestation of pregnancy -NST reactive today and feeling movement while on monitor. -Discussed fetal kick counts and when to follow up  - discussed tx for headache such as excedrine tension, adequate hydration, mag oxide nightly and when to follow up for other s&s -encouraged rest, elevation, and compression socks for swelling  -EFW   5. Polyhydramnios in third trimester complication, single or unspecified fetus AFI 26.24cm, EFW 41%,  Fetal testing at 32 weeks    Preterm labor symptoms and general obstetric precautions including but not limited to vaginal bleeding, contractions, leaking of fluid and fetal movement were reviewed in detail with the patient. Please refer to After Visit Summary for other counseling recommendations.   Return in about 2 weeks (around 02/17/2024) for OB VISIT (MD or APP).  Future Appointments  Date Time Provider Department Center  02/13/2024 11:00 AM [facility] PROVIDER 1 [facility]  02/13/2024 11:30 AM [facility] US1 [facility]  02/20/2024 11:00 AM [facility] PROVIDER 1 [facility]  02/20/2024 11:30 AM [facility] US4 [facility]  02/26/2024 10:35 AM Delores Nidia CROME, FNP [facility] None  02/27/2024  9:15 AM [facility] PROVIDER 1 [facility]  02/27/2024  9:30 AM [facility] US4 [facility]  03/23/2024  6:30 AM [facility] SCHED ROOM [facility] None    Nidia Delores, FNP

## 2024-02-13 ENCOUNTER — Other Ambulatory Visit: Payer: Self-pay | Admitting: *Deleted

## 2024-02-13 ENCOUNTER — Ambulatory Visit

## 2024-02-13 ENCOUNTER — Ambulatory Visit: Attending: Obstetrics | Admitting: Maternal & Fetal Medicine

## 2024-02-13 VITALS — BP 125/70 | HR 73

## 2024-02-13 DIAGNOSIS — O99212 Obesity complicating pregnancy, second trimester: Secondary | ICD-10-CM

## 2024-02-13 DIAGNOSIS — O99213 Obesity complicating pregnancy, third trimester: Secondary | ICD-10-CM | POA: Insufficient documentation

## 2024-02-13 DIAGNOSIS — Z3480 Encounter for supervision of other normal pregnancy, unspecified trimester: Secondary | ICD-10-CM

## 2024-02-13 DIAGNOSIS — I10 Essential (primary) hypertension: Secondary | ICD-10-CM

## 2024-02-13 DIAGNOSIS — O403XX0 Polyhydramnios, third trimester, not applicable or unspecified: Secondary | ICD-10-CM | POA: Diagnosis not present

## 2024-02-13 DIAGNOSIS — O10013 Pre-existing essential hypertension complicating pregnancy, third trimester: Secondary | ICD-10-CM

## 2024-02-13 DIAGNOSIS — E669 Obesity, unspecified: Secondary | ICD-10-CM | POA: Diagnosis not present

## 2024-02-13 DIAGNOSIS — O10913 Unspecified pre-existing hypertension complicating pregnancy, third trimester: Secondary | ICD-10-CM | POA: Insufficient documentation

## 2024-02-13 DIAGNOSIS — O99210 Obesity complicating pregnancy, unspecified trimester: Secondary | ICD-10-CM

## 2024-02-13 DIAGNOSIS — J45909 Unspecified asthma, uncomplicated: Secondary | ICD-10-CM

## 2024-02-13 DIAGNOSIS — Z3A32 32 weeks gestation of pregnancy: Secondary | ICD-10-CM | POA: Insufficient documentation

## 2024-02-13 DIAGNOSIS — O99512 Diseases of the respiratory system complicating pregnancy, second trimester: Secondary | ICD-10-CM

## 2024-02-13 NOTE — Progress Notes (Signed)
   Patient information  Patient Name: Bailey Carney  Patient MRN:   [id number]  Referring practice: MFM Referring Provider: [facility]  Problem List   Patient Active Problem List   Diagnosis Date Noted   Obesity affecting pregnancy, antepartum 01/08/2024   Chronic hypertension 09/24/2023   Supervision of other normal pregnancy, antepartum 08/22/2023   Hirsutism 01/10/2023   Asthma 05/21/2019   Hypertension 05/21/2019   History of postpartum depression 04/29/2019   Maternal Fetal medicine Consult  Bailey Carney is a 33 y.o. G3P1011 at [redacted]w[redacted]d here for ultrasound and consultation. Bailey Carney is doing well today with no acute concerns. Today we focused on the following:   CHTN: Blood pressure well-controlled today.  Compliant with Procardia .  Occasionally having headaches but not blood pressure related.  No red flag symptoms.  Counseled about preeclampsia. The patient had time to ask questions that were answered to her satisfaction.  She verbalized understanding and agrees to proceed with the plan below.  Sonographic findings Single intrauterine pregnancy. Fetal cardiac activity: Observed. Presentation: Cephalic. Interval fetal anatomy appears normal. Fetal biometry shows the estimated fetal weight at the 32 percentile. Amniotic fluid: Within normal limits.  MVP: 6.17 cm. Placenta: Posterior. BPP: 8/8.   There are limitations of prenatal ultrasound such as the inability to detect certain abnormalities due to poor visualization. Various factors such as fetal position, gestational age and maternal body habitus may increase the difficulty in visualizing the fetal anatomy.    Recommendations -Continue Procardia  -Serial growth ultrasounds every 4 weeks until delivery -Antenatal testing to start around 32 weeks  -Delivery around [redacted] weeks gestation  (scheduled for 12/8)  Review of Systems: A review of systems was performed and was negative except per HPI   Vitals and  Physical Exam    02/13/2024   11:02 AM 02/03/2024   11:20 AM 01/25/2024    8:26 PM  Vitals with BMI  Weight  260 lbs   BMI  40.71   Systolic 125 114 881  Diastolic 70 78 67  Pulse 73 92 72    Sitting comfortably on the sonogram table Nonlabored breathing Normal rate and rhythm Abdomen is nontender  Past pregnancies OB History  Gravida Para Term Preterm AB Living  3 1 1  0 1 1  SAB IAB Ectopic Multiple Live Births  1 0 0 0 1    # Outcome Date GA Lbr Len/2nd Weight Sex Type Anes PTL Lv  3 Current           2 SAB 2023     SAB     1 Term 04/18/18 [redacted]w[redacted]d 03:03 / 00:02 6 lb 3.1 oz (2.81 kg) M Vag-Spont EPI  LIV     I spent 20 minutes reviewing the patients chart, including labs and images as well as counseling the patient about her medical conditions. Greater than 50% of the time was spent in direct face-to-face patient counseling.  Delora Smaller  MFM, [place]   02/13/2024  11:55 AM

## 2024-02-19 ENCOUNTER — Other Ambulatory Visit (HOSPITAL_COMMUNITY)
Admission: RE | Admit: 2024-02-19 | Discharge: 2024-02-19 | Disposition: A | Discharge status: Home / Self Care | Source: Ambulatory Visit | Attending: Obstetrics and Gynecology | Admitting: Obstetrics and Gynecology

## 2024-02-19 ENCOUNTER — Ambulatory Visit: Admitting: Obstetrics and Gynecology

## 2024-02-19 ENCOUNTER — Encounter: Payer: Self-pay | Admitting: Obstetrics and Gynecology

## 2024-02-19 VITALS — BP 136/90 | HR 79 | Wt 265.0 lb

## 2024-02-19 DIAGNOSIS — O99210 Obesity complicating pregnancy, unspecified trimester: Secondary | ICD-10-CM

## 2024-02-19 DIAGNOSIS — Z3480 Encounter for supervision of other normal pregnancy, unspecified trimester: Secondary | ICD-10-CM | POA: Insufficient documentation

## 2024-02-19 DIAGNOSIS — Z3A33 33 weeks gestation of pregnancy: Secondary | ICD-10-CM

## 2024-02-19 DIAGNOSIS — I10 Essential (primary) hypertension: Secondary | ICD-10-CM | POA: Diagnosis not present

## 2024-02-19 LAB — POCT URINALYSIS DIPSTICK
Bilirubin, UA: NEGATIVE
Blood, UA: NEGATIVE
Glucose, UA: NEGATIVE
Ketones, UA: NEGATIVE
Nitrite, UA: NEGATIVE
Protein, UA: NEGATIVE
Spec Grav, UA: >=1.03 — AB (ref 1.010–1.025)
Urobilinogen, UA: 0.2 U/dL
pH, UA: 6 (ref 5.0–8.0)

## 2024-02-19 NOTE — Progress Notes (Signed)
 PRENATAL VISIT NOTE  Subjective:  Bailey Carney is a 33 y.o. G3P1011 at [redacted]w[redacted]d being seen today for ongoing prenatal care.  She is currently monitored for the following issues for this high-risk pregnancy and has Asthma; Hypertension; History of postpartum depression; Hirsutism; Supervision of other normal pregnancy, antepartum; Chronic hypertension; and Obesity affecting pregnancy, antepartum on their problem list.  Patient reports no complaints.  Contractions: Irregular. Vag. Bleeding: None.  Movement: Present. Denies leaking of fluid.   The following portions of the patient's history were reviewed and updated as appropriate: allergies, current medications, past family history, past medical history, past social history, past surgical history and problem list.   Objective:   Vitals:   02/19/24 1604 02/19/24 1608  BP: (!) 141/90 (!) 136/90  Pulse: 80 79  Weight: 265 lb (120.2 kg)     Fetal Status:  Fetal Heart Rate (bpm): 150 Fundal Height: 33 cm Movement: Present    General: Alert, oriented and cooperative. Patient is in no acute distress.  Skin: Skin is warm and dry. No rash noted.   Cardiovascular: Normal heart rate noted  Respiratory: Normal respiratory effort, no problems with respiration noted  Abdomen: Soft, gravid, appropriate for gestational age.  Pain/Pressure: Present     Pelvic: Cervical exam deferred        Extremities: Normal range of motion.  Edema: Trace  Mental Status: Normal mood and affect. Normal behavior. Normal judgment and thought content.      01/15/2024    9:06 AM 09/24/2023    4:07 PM 08/22/2023    9:52 AM  Depression screen PHQ 2/9  Decreased Interest 0 2 0  Down, Depressed, Hopeless 0 1 0  PHQ - 2 Score 0 3 0  Altered sleeping 2 3 0  Tired, decreased energy 2 3 0  Change in appetite 0 1 0  Feeling bad or failure about yourself  0 0 0  Trouble concentrating 0 0 0  Moving slowly or fidgety/restless 0 0 0  Suicidal thoughts 0 0 0  PHQ-9 Score 4 10  0  Difficult doing work/chores Not difficult at all          01/15/2024    9:07 AM 09/24/2023    4:07 PM 08/22/2023    9:52 AM 10/11/2021    5:21 PM  GAD 7 : Generalized Anxiety Score  Nervous, Anxious, on Edge 0 1 0 3  Control/stop worrying 0 0 0 2  Worry too much - different things 2 1 0 3  Trouble relaxing 2 0 0 3  Restless 0 0 0 1  Easily annoyed or irritable 0 2 1 2   Afraid - awful might happen 0 0 0 0  Total GAD 7 Score 4 4 1 14   Anxiety Difficulty Not difficult at all       Assessment and Plan:  Pregnancy: G3P1011 at [redacted]w[redacted]d 1. Supervision of other normal pregnancy, antepartum (Primary) Patient is doing well without complaints GBS next visit  2. Chronic hypertension BP stable  Antenatal testing scheduled tomorrow IOL scheduled on 12/8  3. Obesity affecting pregnancy, antepartum, unspecified obesity type COntinue ASA  Preterm labor symptoms and general obstetric precautions including but not limited to vaginal bleeding, contractions, leaking of fluid and fetal movement were reviewed in detail with the patient. Please refer to After Visit Summary for other counseling recommendations.   Return in about 2 weeks (around 03/04/2024) for in person, ROB, High risk.  Future Appointments  Date Time Provider Department Center  02/20/2024 11:00 AM [facility] PROVIDER 1 [facility]  02/20/2024 11:30 AM [facility] US[facility]  03/05/2024 10:45 AM [facility] NST [facility]  03/23/2024  6:30 AM [facility] SCHED ROOM [facility] None    Winton Felt, MD

## 2024-02-19 NOTE — Addendum Note (Signed)
 Addended by: ALGER GONG on: 02/19/2024 04:46 PM   Modules accepted: Orders

## 2024-02-19 NOTE — Progress Notes (Signed)
 ROB, c/o urinary urgency, frequency, feeling off

## 2024-02-20 ENCOUNTER — Other Ambulatory Visit: Payer: Self-pay

## 2024-02-20 ENCOUNTER — Ambulatory Visit: Attending: Obstetrics | Admitting: Obstetrics

## 2024-02-20 ENCOUNTER — Ambulatory Visit (HOSPITAL_BASED_OUTPATIENT_CLINIC_OR_DEPARTMENT_OTHER)

## 2024-02-20 VITALS — BP 114/79 | HR 81

## 2024-02-20 DIAGNOSIS — O99513 Diseases of the respiratory system complicating pregnancy, third trimester: Secondary | ICD-10-CM | POA: Diagnosis not present

## 2024-02-20 DIAGNOSIS — E669 Obesity, unspecified: Secondary | ICD-10-CM | POA: Diagnosis not present

## 2024-02-20 DIAGNOSIS — J45909 Unspecified asthma, uncomplicated: Secondary | ICD-10-CM | POA: Diagnosis not present

## 2024-02-20 DIAGNOSIS — O10012 Pre-existing essential hypertension complicating pregnancy, second trimester: Secondary | ICD-10-CM | POA: Diagnosis not present

## 2024-02-20 DIAGNOSIS — O98513 Other viral diseases complicating pregnancy, third trimester: Secondary | ICD-10-CM

## 2024-02-20 DIAGNOSIS — O10013 Pre-existing essential hypertension complicating pregnancy, third trimester: Secondary | ICD-10-CM | POA: Insufficient documentation

## 2024-02-20 DIAGNOSIS — O403XX0 Polyhydramnios, third trimester, not applicable or unspecified: Secondary | ICD-10-CM

## 2024-02-20 DIAGNOSIS — O99210 Obesity complicating pregnancy, unspecified trimester: Secondary | ICD-10-CM

## 2024-02-20 DIAGNOSIS — Z3A33 33 weeks gestation of pregnancy: Secondary | ICD-10-CM | POA: Diagnosis not present

## 2024-02-20 DIAGNOSIS — Z79899 Other long term (current) drug therapy: Secondary | ICD-10-CM | POA: Diagnosis not present

## 2024-02-20 DIAGNOSIS — O99213 Obesity complicating pregnancy, third trimester: Secondary | ICD-10-CM | POA: Diagnosis not present

## 2024-02-20 DIAGNOSIS — O99212 Obesity complicating pregnancy, second trimester: Secondary | ICD-10-CM | POA: Diagnosis not present

## 2024-02-20 DIAGNOSIS — I10 Essential (primary) hypertension: Secondary | ICD-10-CM

## 2024-02-20 DIAGNOSIS — O10913 Unspecified pre-existing hypertension complicating pregnancy, third trimester: Secondary | ICD-10-CM | POA: Diagnosis not present

## 2024-02-20 LAB — CERVICOVAGINAL ANCILLARY ONLY
Bacterial Vaginitis (gardnerella): NEGATIVE
Candida Glabrata: NEGATIVE
Candida Vaginitis: NEGATIVE
Chlamydia: NEGATIVE
Comment: NEGATIVE
Comment: NEGATIVE
Comment: NEGATIVE
Comment: NEGATIVE
Comment: NORMAL
Neisseria Gonorrhea: NEGATIVE

## 2024-02-20 NOTE — Progress Notes (Signed)
 MFM Consult Note  Bailey Carney is currently at 33 weeks and 4 days.  She has been followed due to maternal obesity with a BMI of 36.7 and chronic hypertension treated with Norvasc .    She denies any problems since her last exam.  Her blood pressure today was 114/79.  Sonographic findings Single intrauterine pregnancy at 33w 4d. Fetal cardiac activity: Observed. Presentation: Cephalic. Amniotic fluid: Within normal limits.  AFI: 12.06 cm.  MVP: 4.88 cm. Placenta: Posterior. BPP: 8/8.   Due to chronic hypertension and obesity, she is already scheduled for induction on March 23, 2024 (at 38+ weeks).  She was advised that waiting until 38 weeks for delivery is reasonable as long as her blood pressures are within normal limits and she does not have preeclampsia.  Preeclampsia precautions were reviewed today.    She will return in 1 week for an NST.  The patient stated that all of her questions were answered today.  A total of 10 minutes was spent counseling and coordinating the care for this patient.  Greater than 50% of the time was spent in direct face-to-face contact.

## 2024-02-26 ENCOUNTER — Encounter: Admitting: Obstetrics and Gynecology

## 2024-02-27 ENCOUNTER — Ambulatory Visit

## 2024-02-27 ENCOUNTER — Other Ambulatory Visit

## 2024-02-27 DIAGNOSIS — Z3480 Encounter for supervision of other normal pregnancy, unspecified trimester: Secondary | ICD-10-CM

## 2024-02-28 ENCOUNTER — Other Ambulatory Visit: Payer: Self-pay | Admitting: Obstetrics

## 2024-02-28 ENCOUNTER — Ambulatory Visit: Attending: Maternal & Fetal Medicine | Admitting: Maternal & Fetal Medicine

## 2024-02-28 ENCOUNTER — Other Ambulatory Visit

## 2024-02-28 ENCOUNTER — Ambulatory Visit (HOSPITAL_BASED_OUTPATIENT_CLINIC_OR_DEPARTMENT_OTHER): Admitting: *Deleted

## 2024-02-28 VITALS — BP 123/89 | HR 94

## 2024-02-28 DIAGNOSIS — Z79899 Other long term (current) drug therapy: Secondary | ICD-10-CM | POA: Diagnosis not present

## 2024-02-28 DIAGNOSIS — I10 Essential (primary) hypertension: Secondary | ICD-10-CM

## 2024-02-28 DIAGNOSIS — O10013 Pre-existing essential hypertension complicating pregnancy, third trimester: Secondary | ICD-10-CM | POA: Diagnosis not present

## 2024-02-28 DIAGNOSIS — Z3A34 34 weeks gestation of pregnancy: Secondary | ICD-10-CM | POA: Insufficient documentation

## 2024-02-28 DIAGNOSIS — O10913 Unspecified pre-existing hypertension complicating pregnancy, third trimester: Secondary | ICD-10-CM

## 2024-02-28 DIAGNOSIS — Z7982 Long term (current) use of aspirin: Secondary | ICD-10-CM | POA: Insufficient documentation

## 2024-02-28 DIAGNOSIS — O99212 Obesity complicating pregnancy, second trimester: Secondary | ICD-10-CM

## 2024-02-28 DIAGNOSIS — J45909 Unspecified asthma, uncomplicated: Secondary | ICD-10-CM | POA: Diagnosis not present

## 2024-02-28 DIAGNOSIS — O99891 Other specified diseases and conditions complicating pregnancy: Secondary | ICD-10-CM | POA: Insufficient documentation

## 2024-02-28 DIAGNOSIS — O403XX0 Polyhydramnios, third trimester, not applicable or unspecified: Secondary | ICD-10-CM | POA: Diagnosis not present

## 2024-02-28 DIAGNOSIS — O99213 Obesity complicating pregnancy, third trimester: Secondary | ICD-10-CM | POA: Diagnosis not present

## 2024-02-28 DIAGNOSIS — O99210 Obesity complicating pregnancy, unspecified trimester: Secondary | ICD-10-CM

## 2024-02-28 NOTE — Progress Notes (Signed)
   Patient information  Patient Name: Bailey Carney  Patient MRN:   [id number]  Referring practice: MFM Referring Provider: [facility]  Problem List   Patient Active Problem List   Diagnosis Date Noted   Obesity affecting pregnancy, antepartum 01/08/2024   Chronic hypertension 09/24/2023   Supervision of other normal pregnancy, antepartum 08/22/2023   Hirsutism 01/10/2023   Asthma 05/21/2019   Hypertension 05/21/2019   History of postpartum depression 04/29/2019    Maternal Fetal medicine Consult  Bailey Carney is a 33 y.o. G3P1011 at [redacted]w[redacted]d here for ultrasound and consultation. Avelyn K Fielden is doing well today with no acute concerns. Today we focused on the following:   CHTN: BP is well controlled. She has no signs or symptoms of preeclampsia.  She is compliant with all of her medications.  We discussed the delivery timing will occur around 38 weeks or sooner if indicated.  Her medications consist of Norvasc  10 mg daily and 80 mg of aspirin  for her blood pressure.  The patient had time to ask questions that were answered to her satisfaction.  She verbalized understanding and agrees to proceed with the plan below.  Recommendations - NST next week followed by BPP and growth the week after.  Delivery at 38 weeks.  Precautions given.  Review of Systems: A review of systems was performed and was negative except per HPI   Vitals and Physical Exam    02/28/2024    7:33 AM 02/20/2024   11:01 AM 02/19/2024    4:08 PM  Vitals with BMI  Systolic 123 114 863  Diastolic 89 79 90  Pulse 94 81 79    Sitting comfortably on the sonogram table Nonlabored breathing Normal rate and rhythm Abdomen is nontender  Past pregnancies OB History  Gravida Para Term Preterm AB Living  3 1 1  0 1 1  SAB IAB Ectopic Multiple Live Births  1 0 0 0 1    # Outcome Date GA Lbr Len/2nd Weight Sex Type Anes PTL Lv  3 Current           2 SAB 2023     SAB     1 Term 04/18/18 [redacted]w[redacted]d 03:03 /  00:02 6 lb 3.1 oz (2.81 kg) M Vag-Spont EPI  LIV     I spent 30 minutes reviewing the patients chart, including labs and images as well as counseling the patient about her medical conditions. Greater than 50% of the time was spent in direct face-to-face patient counseling.  Delora Smaller  MFM, [facility]   02/28/2024  8:47 AM

## 2024-02-28 NOTE — Procedures (Cosign Needed Addendum)
 Bailey Carney [birth year] [redacted]w[redacted]d  Fetus A Non-Stress Test Interpretation for 02/28/24  Indication: Chronic Hypertension  Fetal Heart Rate A Mode: External Baseline Rate (A): 130 bpm Variability: Moderate Accelerations: 15 x 15 Decelerations: None Multiple birth?: No  Uterine Activity Mode: Toco Contraction Frequency (min): 7 Contraction Duration (sec): 40-60 Contraction Quality: Mild Resting Tone Palpated: Relaxed Resting Time: Adequate  Interpretation (Fetal Testing) Nonstress Test Interpretation: Reactive Comments: Tracing reviewed by Dr. William

## 2024-02-29 ENCOUNTER — Inpatient Hospital Stay (HOSPITAL_COMMUNITY)
Admission: AD | Admit: 2024-02-29 | Discharge: 2024-02-29 | Disposition: A | Discharge status: Home / Self Care | Attending: Obstetrics & Gynecology | Admitting: Obstetrics & Gynecology

## 2024-02-29 ENCOUNTER — Other Ambulatory Visit: Payer: Self-pay

## 2024-02-29 ENCOUNTER — Encounter (HOSPITAL_COMMUNITY): Payer: Self-pay | Admitting: Obstetrics & Gynecology

## 2024-02-29 DIAGNOSIS — Z8719 Personal history of other diseases of the digestive system: Secondary | ICD-10-CM

## 2024-02-29 DIAGNOSIS — R519 Headache, unspecified: Secondary | ICD-10-CM

## 2024-02-29 DIAGNOSIS — O26893 Other specified pregnancy related conditions, third trimester: Secondary | ICD-10-CM | POA: Diagnosis not present

## 2024-02-29 DIAGNOSIS — Z3A34 34 weeks gestation of pregnancy: Secondary | ICD-10-CM

## 2024-02-29 DIAGNOSIS — O10913 Unspecified pre-existing hypertension complicating pregnancy, third trimester: Secondary | ICD-10-CM

## 2024-02-29 DIAGNOSIS — Z79899 Other long term (current) drug therapy: Secondary | ICD-10-CM | POA: Insufficient documentation

## 2024-02-29 DIAGNOSIS — R1013 Epigastric pain: Secondary | ICD-10-CM

## 2024-02-29 DIAGNOSIS — O10919 Unspecified pre-existing hypertension complicating pregnancy, unspecified trimester: Secondary | ICD-10-CM

## 2024-02-29 DIAGNOSIS — Z7982 Long term (current) use of aspirin: Secondary | ICD-10-CM | POA: Diagnosis not present

## 2024-02-29 LAB — COMPREHENSIVE METABOLIC PANEL WITH GFR
ALT: 14 U/L (ref 0–44)
AST: 17 U/L (ref 15–41)
Albumin: 2.4 g/dL — ABNORMAL LOW (ref 3.5–5.0)
Alkaline Phosphatase: 78 U/L (ref 38–126)
Anion gap: 8 (ref 5–15)
BUN: 10 mg/dL (ref 6–20)
CO2: 24 mmol/L (ref 22–32)
Calcium: 8.6 mg/dL — ABNORMAL LOW (ref 8.9–10.3)
Chloride: 105 mmol/L (ref 98–111)
Creatinine, Ser: 0.76 mg/dL (ref 0.44–1.00)
GFR, Estimated: >60 mL/min (ref >60)
Glucose, Bld: 100 mg/dL — ABNORMAL HIGH (ref 70–99)
Potassium: 4 mmol/L (ref 3.5–5.1)
Sodium: 137 mmol/L (ref 135–145)
Total Bilirubin: 0.6 mg/dL (ref 0.0–1.2)
Total Protein: 5.6 g/dL — ABNORMAL LOW (ref 6.5–8.1)

## 2024-02-29 LAB — CBC WITH DIFFERENTIAL/PLATELET
Abs Immature Granulocytes: 0.03 K/uL (ref 0.00–0.07)
Basophils Absolute: 0 K/uL (ref 0.0–0.1)
Basophils Relative: 0 %
Eosinophils Absolute: 0 K/uL (ref 0.0–0.5)
Eosinophils Relative: 0 %
HCT: 32.6 % — ABNORMAL LOW (ref 36.0–46.0)
Hemoglobin: 11.1 g/dL — ABNORMAL LOW (ref 12.0–15.0)
Immature Granulocytes: 0 %
Lymphocytes Relative: 24 %
Lymphs Abs: 2.5 K/uL (ref 0.7–4.0)
MCH: 29.6 pg (ref 26.0–34.0)
MCHC: 34 g/dL (ref 30.0–36.0)
MCV: 86.9 fL (ref 80.0–100.0)
Monocytes Absolute: 0.8 K/uL (ref 0.1–1.0)
Monocytes Relative: 7 %
Neutro Abs: 7 K/uL (ref 1.7–7.7)
Neutrophils Relative %: 69 %
Platelets: 144 K/uL — ABNORMAL LOW (ref 150–400)
RBC: 3.75 MIL/uL — ABNORMAL LOW (ref 3.87–5.11)
RDW: 12.7 % (ref 11.5–15.5)
WBC: 10.3 K/uL (ref 4.0–10.5)
nRBC: 0 % (ref 0.0–0.2)

## 2024-02-29 LAB — AMYLASE: Amylase: 69 U/L (ref 28–100)

## 2024-02-29 LAB — URINALYSIS, ROUTINE W REFLEX MICROSCOPIC
Bilirubin Urine: NEGATIVE
Glucose, UA: NEGATIVE mg/dL
Hgb urine dipstick: NEGATIVE
Ketones, ur: NEGATIVE mg/dL
Nitrite: NEGATIVE
Protein, ur: 30 mg/dL — AB
Specific Gravity, Urine: 1.026 (ref 1.005–1.030)
pH: 5 (ref 5.0–8.0)

## 2024-02-29 LAB — LIPASE, BLOOD: Lipase: 25 U/L (ref 11–51)

## 2024-02-29 LAB — PROTEIN / CREATININE RATIO, URINE
Creatinine, Urine: 225 mg/dL
Protein Creatinine Ratio: 0.08 mg/mg{creat} (ref 0.00–0.15)
Total Protein, Urine: 19 mg/dL

## 2024-02-29 MED ORDER — BLOOD PRESSURE KIT DEVI: 1.0000 | 0 refills | Status: DC

## 2024-02-29 MED ORDER — METOCLOPRAMIDE HCL 10 MG PO TABS: 10.0000 mg | ORAL_TABLET | Freq: Four times a day (QID) | ORAL | 0 refills | Status: DC | PRN

## 2024-02-29 MED ORDER — FAMOTIDINE 20 MG PO TABS: 20.0000 mg | ORAL_TABLET | Freq: Two times a day (BID) | ORAL | 0 refills | Status: DC

## 2024-02-29 MED ORDER — ACETAMINOPHEN-CAFFEINE 500-65 MG PO TABS: 2.0000 | ORAL_TABLET | Freq: Four times a day (QID) | ORAL | 0 refills | Status: DC | PRN

## 2024-02-29 NOTE — MAU Provider Note (Cosign Needed Addendum)
 History     CSN: [id number]  Arrival date and time: 02/29/24 1827   Event Date/Time   First Provider Initiated Contact with Patient 02/29/24 1910      Chief Complaint  Patient presents with   Contractions   Bailey Carney , a  33 y.o. G3P1011 at [redacted]w[redacted]d presents to MAU with complaints of pelvic pain, and RUQ pain and a headache. Patient states that the headache has been on-going for a few days. Reports trying tylenol  and Excedrin in the past but minimal relief so denies attempting to try anything else. She currently rates pain as a 7/10. Denies visual disturbances. Epigastric pain is a 5/10 and states that its intermittent. Describes as a dull achy sensation. Reports a hx of complications with her gall bladder. She also reports contractions since yesterday at 8am. Is unsure of frequency. She denies abnormal vaginal discharge and vaginal bleeding. Endorses positive Fetal movement.          OB History     Gravida  3   Para  1   Term  1   Preterm  0   AB  1   Living  1      SAB  1   IAB  0   Ectopic  0   Multiple  0   Live Births  1           Past Medical History:  Diagnosis Date   Asthma    does not use inhaler often   Chlamydia    Cholecystitis    Genital herpes    Hypertension    on meds    Past Surgical History:  Procedure Laterality Date   NO PAST SURGERIES      Family History  Problem Relation Age of Onset   Hypertension Mother    Breast cancer Maternal Grandmother    Hypertension Maternal Grandmother    Cancer Maternal Grandmother    Dementia Maternal Grandmother    Cancer Maternal Grandfather    Breast cancer Paternal Grandmother    Cancer Paternal Grandmother     Social History   Tobacco Use   Smoking status: Never   Smokeless tobacco: Never  Vaping Use   Vaping status: Never Used  Substance Use Topics   Alcohol use: Not Currently    Comment: socially   Drug use: No    Allergies:  Allergies  Allergen Reactions    Amoxicillin Rash and Other (See Comments)    Has patient had a PCN reaction causing immediate rash, facial/tongue/throat swelling, SOB or lightheadedness with hypotension: No Has patient had a PCN reaction causing severe rash involving mucus membranes or skin necrosis: No Has patient had a PCN reaction that required hospitalization No Has patient had a PCN reaction occurring within the last 10 years: No If all of the above answers are NO, then may proceed with Cephalosporin use.     Medications Prior to Admission  Medication Sig Dispense Refill Last Dose/Taking   amLODipine  (NORVASC ) 10 MG tablet Take 1 tablet (10 mg total) by mouth daily. 30 tablet 6 02/29/2024   aspirin  EC 81 MG tablet Take 2 tablets (162 mg total) by mouth daily. Start taking when you are [redacted] weeks pregnant for rest of pregnancy for prevention of preeclampsia 300 tablet 2 02/29/2024   Prenatal Vit-Fe Fumarate-FA (PRENATAL VITAMIN PO) Take 1 tablet by mouth daily. 2 gummies   02/29/2024   acetaminophen  (TYLENOL ) 325 MG tablet Take 2 tablets (650 mg total) by mouth every  6 (six) hours as needed. 30 tablet 0 Unknown   albuterol  (PROVENTIL  HFA;VENTOLIN  HFA) 108 (90 Base) MCG/ACT inhaler Inhale 1-2 puffs into the lungs every 6 (six) hours as needed for wheezing or shortness of breath. 1 Inhaler 0 Unknown   Blood Pressure Monitoring (BLOOD PRESSURE KIT) DEVI 1 Device by Does not apply route once a week. (Patient not taking: No sig reported) 1 each 0 Unknown   cyclobenzaprine  (FLEXERIL ) 10 MG tablet Take 1 tablet (10 mg total) by mouth 2 (two) times daily as needed for muscle spasms. (Patient not taking: No sig reported) 20 tablet 0 Unknown   metoCLOPramide  (REGLAN ) 10 MG tablet Take 1 tablet (10 mg total) by mouth every 6 (six) hours. (Patient not taking: No sig reported) 30 tablet 0 Unknown   ondansetron  (ZOFRAN -ODT) 4 MG disintegrating tablet Take 1 tablet (4 mg total) by mouth every 6 (six) hours as needed for nausea. (Patient  not taking: No sig reported) 20 tablet 2 Unknown   Prenatal Vit-Fe Phos-FA-Omega (VITAFOL  GUMMIES) 3.33-0.333-34.8 MG CHEW Chew 3 tablets by mouth daily. (Patient not taking: Reported on 12/19/2023) 90 tablet 5 Unknown   promethazine  (PHENERGAN ) 25 MG tablet Take 1 tablet (25 mg total) by mouth every 6 (six) hours as needed for nausea or vomiting. (Patient not taking: No sig reported) 30 tablet 1 Unknown    Review of Systems  Constitutional:  Negative for chills, fatigue and fever.  Eyes:  Negative for pain and visual disturbance.  Respiratory:  Negative for apnea, shortness of breath and wheezing.   Cardiovascular:  Negative for chest pain and palpitations.  Gastrointestinal:  Positive for abdominal pain. Negative for constipation, diarrhea, nausea and vomiting.  Genitourinary:  Positive for pelvic pain. Negative for difficulty urinating, dysuria, vaginal bleeding, vaginal discharge and vaginal pain.  Musculoskeletal:  Negative for back pain.  Neurological:  Positive for headaches. Negative for dizziness, seizures and weakness.  Psychiatric/Behavioral:  Negative for suicidal ideas.    Physical Exam   Temperature 98.3 F (36.8 C), temperature source Oral, resp. rate 16, height 5' 7 (1.702 m), weight 121.7 kg, last menstrual period 06/22/2023.  Physical Exam Vitals and nursing note reviewed. Exam conducted with a chaperone present (Amy RN).  Constitutional:      General: She is not in acute distress.    Appearance: Normal appearance. She is not ill-appearing.  HENT:     Head: Normocephalic.  Cardiovascular:     Rate and Rhythm: Normal rate.  Pulmonary:     Effort: Pulmonary effort is normal.     Breath sounds: Normal breath sounds.  Abdominal:     Palpations: Abdomen is soft.     Tenderness: There is no abdominal tenderness.     Comments: Gravid uterus   Genitourinary:    Comments: Dilation: Fingertip Effacement (%): Thick Station: Ballotable Exam by:: Claris,  CNM  Musculoskeletal:        General: No swelling. Normal range of motion.     Cervical back: Normal range of motion.  Skin:    General: Skin is warm and dry.     Capillary Refill: Capillary refill takes 2 to 3 seconds.  Neurological:     Mental Status: She is alert and oriented to person, place, and time.  Psychiatric:        Mood and Affect: Mood normal.    FHT: 135bpm with moderate variability 15x15 accels no decels.  Toco: UI. Readjusted several times no UC.   MAU Course  Procedures Orders Placed This  Encounter  Procedures   Urinalysis, Routine w reflex microscopic -Urine, Clean Catch   Comprehensive metabolic panel   Amylase   Lipase, blood   CBC with Differential/Platelet   Protein / creatinine ratio, urine    MDM - With hx of gallbladder, CMP Amylase and Lipase ordered.(NM) - Chronic hypertension on Norvasc , BPs normotensive, but given hx in the presence of a HA and epigastric pain, PreE labs ordered. Low suspicion for PreE   - Low suspicion for PTL cervix closed.  - PCR normal low suspicion for PreE.   - Transfer of care to L. Kellyn Mansfield NP @ 2030 Shantonette (Claris) Emilio, MSN, CNM  [facility]  02/29/2024 8:29 PM    I resumed care of this patient at 2029 and reviewed the labs and NST. Patient declined treatment for her HA and epigastric pain. Labs within normal limits and PC ratio normal.  Low suspicion for preeclampsia or acute pathology at this time.  Patient did request prescriptions for Excedrin, Pepcid  and Reglan  as needed for both headache and her epigastric pain at discharge.  Scripts were sent to the patient's pharmacy of choice   Assessment and Plan   Nonintractable headache, unspecified chronicity pattern, unspecified headache type  Epigastric abdominal pain affecting pregnancy in third trimester  [redacted] weeks gestation of pregnancy  H/O cholelithiasis  Chronic hypertension affecting pregnancy   Allergies as of 02/29/2024        Reactions   Amoxicillin Rash, Other (See Comments)   Has patient had a PCN reaction causing immediate rash, facial/tongue/throat swelling, SOB or lightheadedness with hypotension: No Has patient had a PCN reaction causing severe rash involving mucus membranes or skin necrosis: No Has patient had a PCN reaction that required hospitalization No Has patient had a PCN reaction occurring within the last 10 years: No If all of the above answers are NO, then may proceed with Cephalosporin use.        Medication List     TAKE these medications    acetaminophen  325 MG tablet Commonly known as: TYLENOL  Take 2 tablets (650 mg total) by mouth every 6 (six) hours as needed.   acetaminophen -caffeine 500-65 MG Tabs per tablet Commonly known as: EXCEDRIN TENSION HEADACHE Take 2 tablets by mouth every 6 (six) hours as needed (Foe headaches).   albuterol  108 (90 Base) MCG/ACT inhaler Commonly known as: VENTOLIN  HFA Inhale 1-2 puffs into the lungs every 6 (six) hours as needed for wheezing or shortness of breath.   amLODipine  10 MG tablet Commonly known as: Norvasc  Take 1 tablet (10 mg total) by mouth daily.   aspirin  EC 81 MG tablet Take 2 tablets (162 mg total) by mouth daily. Start taking when you are [redacted] weeks pregnant for rest of pregnancy for prevention of preeclampsia   Blood Pressure Kit Devi 1 Device by Does not apply route once a week. What changed: Another medication with the same name was added. Make sure you understand how and when to take each.   Blood Pressure Kit Devi 1 Device by Does not apply route once a week. What changed: You were already taking a medication with the same name, and this prescription was added. Make sure you understand how and when to take each.   cyclobenzaprine  10 MG tablet Commonly known as: FLEXERIL  Take 1 tablet (10 mg total) by mouth 2 (two) times daily as needed for muscle spasms.   famotidine  20 MG tablet Commonly known as: Pepcid  Take 1  tablet (20 mg total)  by mouth 2 (two) times daily.   metoCLOPramide  10 MG tablet Commonly known as: REGLAN  Take 1 tablet (10 mg total) by mouth every 6 (six) hours. What changed: Another medication with the same name was added. Make sure you understand how and when to take each.   metoCLOPramide  10 MG tablet Commonly known as: REGLAN  Take 1 tablet (10 mg total) by mouth every 6 (six) hours as needed for nausea. What changed: You were already taking a medication with the same name, and this prescription was added. Make sure you understand how and when to take each.   ondansetron  4 MG disintegrating tablet Commonly known as: ZOFRAN -ODT Take 1 tablet (4 mg total) by mouth every 6 (six) hours as needed for nausea.   PRENATAL VITAMIN PO Take 1 tablet by mouth daily. 2 gummies   promethazine  25 MG tablet Commonly known as: PHENERGAN  Take 1 tablet (25 mg total) by mouth every 6 (six) hours as needed for nausea or vomiting.   Vitafol  Gummies 3.33-0.333-34.8 MG Chew Chew 3 tablets by mouth daily.        Olam Dalton, MSN, [facility], [facility] [employer]   ----------------------------------------------------------------------------------------------------

## 2024-02-29 NOTE — MAU Note (Signed)
 Bailey Carney is a 33 y.o. at [redacted]w[redacted]d here in MAU reporting: CTX that start yesterday morning around 0800. Pt has not been timing CTX and unaware of frequency. No c/o of LOF or VB, pt reports slightly reduced FM but also states baby does not usually move much during the day  LMP:  Onset of complaint: 02/28/24 @0800  Pain score: 7/10  FHT: pt by-passed triage and placed directly in MAU exam room   Lab orders placed from triage:

## 2024-02-29 NOTE — Discharge Instructions (Signed)

## 2024-03-05 ENCOUNTER — Encounter (HOSPITAL_COMMUNITY): Payer: Self-pay | Admitting: Obstetrics & Gynecology

## 2024-03-05 ENCOUNTER — Ambulatory Visit: Admitting: Obstetrics and Gynecology

## 2024-03-05 ENCOUNTER — Inpatient Hospital Stay (HOSPITAL_COMMUNITY)
Admission: AD | Admit: 2024-03-05 | Discharge: 2024-03-05 | Disposition: A | Discharge status: Home / Self Care | Attending: Obstetrics & Gynecology | Admitting: Obstetrics & Gynecology

## 2024-03-05 ENCOUNTER — Ambulatory Visit

## 2024-03-05 ENCOUNTER — Other Ambulatory Visit: Payer: Self-pay

## 2024-03-05 ENCOUNTER — Encounter: Payer: Self-pay | Admitting: Obstetrics and Gynecology

## 2024-03-05 VITALS — BP 142/95 | HR 87 | Wt 269.4 lb

## 2024-03-05 DIAGNOSIS — O10913 Unspecified pre-existing hypertension complicating pregnancy, third trimester: Secondary | ICD-10-CM | POA: Insufficient documentation

## 2024-03-05 DIAGNOSIS — I10 Essential (primary) hypertension: Secondary | ICD-10-CM | POA: Diagnosis not present

## 2024-03-05 DIAGNOSIS — Z3A35 35 weeks gestation of pregnancy: Secondary | ICD-10-CM | POA: Insufficient documentation

## 2024-03-05 DIAGNOSIS — O99113 Other diseases of the blood and blood-forming organs and certain disorders involving the immune mechanism complicating pregnancy, third trimester: Secondary | ICD-10-CM | POA: Diagnosis not present

## 2024-03-05 DIAGNOSIS — Z8659 Personal history of other mental and behavioral disorders: Secondary | ICD-10-CM | POA: Diagnosis not present

## 2024-03-05 DIAGNOSIS — D696 Thrombocytopenia, unspecified: Secondary | ICD-10-CM | POA: Insufficient documentation

## 2024-03-05 DIAGNOSIS — Z79899 Other long term (current) drug therapy: Secondary | ICD-10-CM | POA: Diagnosis not present

## 2024-03-05 DIAGNOSIS — O99210 Obesity complicating pregnancy, unspecified trimester: Secondary | ICD-10-CM

## 2024-03-05 DIAGNOSIS — Z8759 Personal history of other complications of pregnancy, childbirth and the puerperium: Secondary | ICD-10-CM | POA: Diagnosis not present

## 2024-03-05 DIAGNOSIS — Z3480 Encounter for supervision of other normal pregnancy, unspecified trimester: Secondary | ICD-10-CM

## 2024-03-05 DIAGNOSIS — O10919 Unspecified pre-existing hypertension complicating pregnancy, unspecified trimester: Secondary | ICD-10-CM

## 2024-03-05 DIAGNOSIS — O99213 Obesity complicating pregnancy, third trimester: Secondary | ICD-10-CM | POA: Diagnosis not present

## 2024-03-05 LAB — COMPREHENSIVE METABOLIC PANEL WITH GFR
ALT: 15 U/L (ref 0–44)
AST: 19 U/L (ref 15–41)
Albumin: 2.5 g/dL — ABNORMAL LOW (ref 3.5–5.0)
Alkaline Phosphatase: 84 U/L (ref 38–126)
Anion gap: 9 (ref 5–15)
BUN: 12 mg/dL (ref 6–20)
CO2: 20 mmol/L — ABNORMAL LOW (ref 22–32)
Calcium: 8.9 mg/dL (ref 8.9–10.3)
Chloride: 106 mmol/L (ref 98–111)
Creatinine, Ser: 0.83 mg/dL (ref 0.44–1.00)
GFR, Estimated: >60 mL/min (ref >60)
Glucose, Bld: 119 mg/dL — ABNORMAL HIGH (ref 70–99)
Potassium: 3.6 mmol/L (ref 3.5–5.1)
Sodium: 135 mmol/L (ref 135–145)
Total Bilirubin: 0.6 mg/dL (ref 0.0–1.2)
Total Protein: 5.6 g/dL — ABNORMAL LOW (ref 6.5–8.1)

## 2024-03-05 LAB — CBC
HCT: 32.7 % — ABNORMAL LOW (ref 36.0–46.0)
Hemoglobin: 11.3 g/dL — ABNORMAL LOW (ref 12.0–15.0)
MCH: 29.7 pg (ref 26.0–34.0)
MCHC: 34.6 g/dL (ref 30.0–36.0)
MCV: 85.8 fL (ref 80.0–100.0)
Platelets: 147 K/uL — ABNORMAL LOW (ref 150–400)
RBC: 3.81 MIL/uL — ABNORMAL LOW (ref 3.87–5.11)
RDW: 12.9 % (ref 11.5–15.5)
WBC: 11.6 K/uL — ABNORMAL HIGH (ref 4.0–10.5)
nRBC: 0 % (ref 0.0–0.2)

## 2024-03-05 LAB — URINALYSIS, ROUTINE W REFLEX MICROSCOPIC
Bilirubin Urine: NEGATIVE
Glucose, UA: NEGATIVE mg/dL
Hgb urine dipstick: NEGATIVE
Ketones, ur: 20 mg/dL — AB
Nitrite: NEGATIVE
Protein, ur: 30 mg/dL — AB
Specific Gravity, Urine: 1.028 (ref 1.005–1.030)
pH: 5 (ref 5.0–8.0)

## 2024-03-05 LAB — PROTEIN / CREATININE RATIO, URINE
Creatinine, Urine: 307 mg/dL
Protein Creatinine Ratio: 0.09 mg/mg{creat} (ref 0.00–0.15)
Total Protein, Urine: 27 mg/dL

## 2024-03-05 MED ORDER — ACETAMINOPHEN 500 MG PO TABS
1000.0000 mg | ORAL_TABLET | Freq: Once | ORAL | Status: AC
  Administered 2024-03-05: 1000 mg via ORAL
  Filled 2024-03-05: qty 2

## 2024-03-05 MED ORDER — CYCLOBENZAPRINE HCL 5 MG PO TABS
10.0000 mg | ORAL_TABLET | Freq: Once | ORAL | Status: AC
  Administered 2024-03-05: 10 mg via ORAL
  Filled 2024-03-05: qty 2

## 2024-03-05 NOTE — Progress Notes (Signed)
 NST  BP @ 142/89 and 142/95 pt takes amlodipine .

## 2024-03-05 NOTE — Progress Notes (Signed)
 PRENATAL VISIT NOTE  Subjective:  Bailey Carney is a 33 y.o. G3P1011 at [redacted]w[redacted]d being seen today for ongoing prenatal care.  She is currently monitored for the following issues for this high-risk pregnancy and has Asthma; Hypertension; History of postpartum depression; Hirsutism; Supervision of other normal pregnancy, antepartum; Chronic hypertension; and Obesity affecting pregnancy, antepartum on their problem list.  Patient reports reports daily headache, currently has one, not resolved by tyenol or excedrine, reports occasional seeing spots, yesterday had a dizzy spell. Endorses elevated BPs at home .  Contractions: Irritability. Vag. Bleeding: None.  Movement: Present. Denies leaking of fluid.   The following portions of the patient's history were reviewed and updated as appropriate: allergies, current medications, past family history, past medical history, past social history, past surgical history and problem list.   Objective:   Vitals:   03/05/24 1455 03/05/24 1505  BP: (!) 142/89 (!) 142/95  Pulse: 81 87  Weight: 269 lb 6.4 oz (122.2 kg)     Fetal Status:  Fetal Heart Rate (bpm): 132   Movement: Present    General: Alert, oriented and cooperative. Patient is in no acute distress.  Skin: Skin is warm and dry. No rash noted.   Cardiovascular: Normal heart rate noted  Respiratory: Normal respiratory effort, no problems with respiration noted  Abdomen: Soft, gravid, appropriate for gestational age.  Pain/Pressure: Present     Pelvic: Cervical exam deferred        Extremities: Normal range of motion.  Edema: Mild pitting, slight indentation  Mental Status: Normal mood and affect. Normal behavior. Normal judgment and thought content.      03/05/2024    3:03 PM 01/15/2024    9:06 AM 09/24/2023    4:07 PM  Depression screen PHQ 2/9  Decreased Interest 1 0 2  Down, Depressed, Hopeless 0 0 1  PHQ - 2 Score 1 0 3  Altered sleeping 0 2 3  Tired, decreased energy 1 2 3   Change in  appetite 0 0 1  Feeling bad or failure about yourself  0 0 0  Trouble concentrating 0 0 0  Moving slowly or fidgety/restless 0 0 0  Suicidal thoughts 0 0 0  PHQ-9 Score 2 4  10    Difficult doing work/chores  Not difficult at all      Data saved with a previous flowsheet row definition        03/05/2024    3:04 PM 01/15/2024    9:07 AM 09/24/2023    4:07 PM 08/22/2023    9:52 AM  GAD 7 : Generalized Anxiety Score  Nervous, Anxious, on Edge 1 0 1 0  Control/stop worrying 1 0 0 0  Worry too much - different things 1 2 1  0  Trouble relaxing 0 2 0 0  Restless 0 0 0 0  Easily annoyed or irritable 0 0 2 1  Afraid - awful might happen 0 0 0 0  Total GAD 7 Score 3 4 4 1   Anxiety Difficulty  Not difficult at all      Assessment and Plan:  Pregnancy: G3P1011 at [redacted]w[redacted]d 1. Supervision of other normal pregnancy, antepartum (Primary)   2. Chronic hypertension On amlodipine  10 mg Elevated today and symptomatic, send to MAU for PEC work up  Adjust IOL to 37 weeks pending MAU visit   NST reactive  - Fetal nonstress test; Future  3. History of postpartum depression  4. Obesity affecting pregnancy, antepartum, unspecified obesity type  Preterm labor symptoms and general obstetric precautions including but not limited to vaginal bleeding, contractions, leaking of fluid and fetal movement were reviewed in detail with the patient. Please refer to After Visit Summary for other counseling recommendations.   Return in about 1 week (around 03/12/2024) for OB VISIT (MD or APP).  Future Appointments  Date Time Provider Department Center  03/16/2024 11:15 AM [facility] PROVIDER 1 [facility]  03/16/2024 11:30 AM [facility] US1 [facility]  03/23/2024  6:30 AM [facility] SCHED ROOM [facility] None    Nidia Daring, FNP

## 2024-03-05 NOTE — MAU Note (Signed)
 Bailey Carney is a 33 y.o. at [redacted]w[redacted]d here in MAU reporting: she was sent from office due to elevated BP, reports BP was high both times when taken @ office.  Reports has current HA and seeing spots.  States hasn't taken any meds to treat HA, nothing works. Denies VB and LOF.  Endorses +FM.  LMP: 06/22/2023 Onset of complaint: today Pain score: 9 Vitals:   03/05/24 1848  BP: (!) 149/99  Pulse: 94  Resp: 19  Temp: 98.5 F (36.9 C)  SpO2: 100%     FHT: 157 bpm  Lab orders placed from triage: UA

## 2024-03-05 NOTE — MAU Provider Note (Signed)
 History     [id number]  Arrival date and time: 03/05/24 1828    Chief Complaint  Patient presents with   Headache   BP Evaluation     HPI Bailey Carney is a 33 y.o. at [redacted]w[redacted]d by [redacted]w[redacted]d US , who presents for elevated BP in the office. She reports headache and vision changes. Has a notable history of cHTN on amlodipine . Took her amlodipine  this morning around 0900. Denies VB, LOF or contractions. Endorses good FM. Denies RUQ pain.    B/Positive/-- (06/10 1600)  Past Medical History:  Diagnosis Date   Asthma    does not use inhaler often   Chlamydia    Cholecystitis    Genital herpes    Hypertension    on meds    Past Surgical History:  Procedure Laterality Date   NO PAST SURGERIES      Family History  Problem Relation Age of Onset   Hypertension Mother    Breast cancer Maternal Grandmother    Hypertension Maternal Grandmother    Cancer Maternal Grandmother    Dementia Maternal Grandmother    Cancer Maternal Grandfather    Breast cancer Paternal Grandmother    Cancer Paternal Grandmother     Social History   Socioeconomic History   Marital status: Single    Spouse name: Not on file   Number of children: Not on file   Years of education: Not on file   Highest education level: Not on file  Occupational History   Not on file  Tobacco Use   Smoking status: Never   Smokeless tobacco: Never  Vaping Use   Vaping status: Never Used  Substance and Sexual Activity   Alcohol use: Not Currently    Comment: socially   Drug use: No   Sexual activity: Yes    Birth control/protection: None  Other Topics Concern   Not on file  Social History Narrative   Lives with sister    Social Drivers of Health   Financial Resource Strain: Low Risk  (11/25/2017)   Overall Financial Resource Strain (CARDIA)    Difficulty of Paying Living Expenses: Not hard at all  Food Insecurity: No Food Insecurity (02/29/2024)   Hunger Vital Sign    Worried About Running Out of Food in  the Last Year: Never true    Ran Out of Food in the Last Year: Never true  Transportation Needs: No Transportation Needs (02/29/2024)   PRAPARE - Administrator, Civil Service (Medical): No    Lack of Transportation (Non-Medical): No  Physical Activity: Unknown (11/25/2017)   Exercise Vital Sign    Days of Exercise per Week: Patient declined    Minutes of Exercise per Session: Patient declined  Stress: Not on file  Social Connections: Unknown (11/25/2017)   Social Connection and Isolation Panel    Frequency of Communication with Friends and Family: Patient declined    Frequency of Social Gatherings with Friends and Family: Patient declined    Attends Religious Services: Patient declined    Database Administrator or Organizations: Patient declined    Attends Banker Meetings: Patient declined    Marital Status: Patient declined  Intimate Partner Violence: Not At Risk (02/29/2024)   Humiliation, Afraid, Rape, and Kick questionnaire    Fear of Current or Ex-Partner: No    Emotionally Abused: No    Physically Abused: No    Sexually Abused: No    Allergies  Allergen Reactions   Amoxicillin Rash  and Other (See Comments)    Has patient had a PCN reaction causing immediate rash, facial/tongue/throat swelling, SOB or lightheadedness with hypotension: No Has patient had a PCN reaction causing severe rash involving mucus membranes or skin necrosis: No Has patient had a PCN reaction that required hospitalization No Has patient had a PCN reaction occurring within the last 10 years: No If all of the above answers are NO, then may proceed with Cephalosporin use.     No current facility-administered medications on file prior to encounter.   Current Outpatient Medications on File Prior to Encounter  Medication Sig Dispense Refill   amLODipine  (NORVASC ) 10 MG tablet Take 1 tablet (10 mg total) by mouth daily. 30 tablet 6   aspirin  EC 81 MG tablet Take 2 tablets (162  mg total) by mouth daily. Start taking when you are [redacted] weeks pregnant for rest of pregnancy for prevention of preeclampsia 300 tablet 2   Prenatal Vit-Fe Fumarate-FA (PRENATAL VITAMIN PO) Take 1 tablet by mouth daily. 2 gummies     albuterol  (PROVENTIL  HFA;VENTOLIN  HFA) 108 (90 Base) MCG/ACT inhaler Inhale 1-2 puffs into the lungs every 6 (six) hours as needed for wheezing or shortness of breath. 1 Inhaler 0    Pertinent positives and negative per HPI, all others reviewed and negative  Physical Exam   BP (!) 131/96   Pulse 90   Temp 98.5 F (36.9 C) (Oral)   Resp 19   Ht 5' 7 (1.702 m)   Wt 121.7 kg   LMP 06/22/2023 (Exact Date)   SpO2 100%   BMI 42.01 kg/m   Patient Vitals for the past 24 hrs:  BP Temp Temp src Pulse Resp SpO2 Height Weight  03/05/24 2031 (!) 131/96 -- -- 90 -- -- -- --  03/05/24 2016 (!) 149/97 -- -- 93 -- -- -- --  03/05/24 2006 (!) 139/102 -- -- 95 -- -- -- --  03/05/24 1916 (!) 137/93 -- -- 88 -- -- -- --  03/05/24 1906 (!) 147/88 -- -- 82 -- -- -- --  03/05/24 1848 (!) 149/99 98.5 F (36.9 C) Oral 94 19 100 % -- --  03/05/24 1842 -- -- -- -- -- -- 5' 7 (1.702 m) 121.7 kg    Physical Exam Vitals and nursing note reviewed.  Constitutional:      Appearance: She is well-developed.  HENT:     Head: Normocephalic and atraumatic.     Mouth/Throat:     Mouth: Mucous membranes are moist.  Eyes:     Extraocular Movements: Extraocular movements intact.  Cardiovascular:     Rate and Rhythm: Normal rate and regular rhythm.  Pulmonary:     Effort: Pulmonary effort is normal.  Abdominal:     Palpations: Abdomen is soft.     Tenderness: There is no abdominal tenderness.  Skin:    Capillary Refill: Capillary refill takes less than 2 seconds.  Neurological:     General: No focal deficit present.     Mental Status: She is alert.    FHT Baseline: 135 bpm Variability: Good {> 6 bpm) Accelerations: Reactive Decelerations: Absent Uterine activity:  None  Labs Results for orders placed or performed during the hospital encounter of 03/05/24 (from the past 24 hours)  Protein / creatinine ratio, urine     Status: None   Collection Time: 03/05/24  6:45 PM  Result Value Ref Range   Creatinine, Urine 307 mg/dL   Total Protein, Urine 27 mg/dL   Protein  Creatinine Ratio 0.09 0.00 - 0.15 mg/mg[Cre]  Urinalysis, Routine w reflex microscopic -Urine, Clean Catch     Status: Abnormal   Collection Time: 03/05/24  6:45 PM  Result Value Ref Range   Color, Urine AMBER (A) YELLOW   APPearance CLOUDY (A) CLEAR   Specific Gravity, Urine 1.028 1.005 - 1.030   pH 5.0 5.0 - 8.0   Glucose, UA NEGATIVE NEGATIVE mg/dL   Hgb urine dipstick NEGATIVE NEGATIVE   Bilirubin Urine NEGATIVE NEGATIVE   Ketones, ur 20 (A) NEGATIVE mg/dL   Protein, ur 30 (A) NEGATIVE mg/dL   Nitrite NEGATIVE NEGATIVE   Leukocytes,Ua LARGE (A) NEGATIVE   RBC / HPF 0-5 0 - 5 RBC/hpf   WBC, UA 11-20 0 - 5 WBC/hpf   Bacteria, UA MANY (A) NONE SEEN   Squamous Epithelial / HPF 0-5 0 - 5 /HPF   Mucus PRESENT   CBC     Status: Abnormal   Collection Time: 03/05/24  7:17 PM  Result Value Ref Range   WBC 11.6 (H) 4.0 - 10.5 K/uL   RBC 3.81 (L) 3.87 - 5.11 MIL/uL   Hemoglobin 11.3 (L) 12.0 - 15.0 g/dL   HCT 67.2 (L) 63.9 - 53.9 %   MCV 85.8 80.0 - 100.0 fL   MCH 29.7 26.0 - 34.0 pg   MCHC 34.6 30.0 - 36.0 g/dL   RDW 87.0 88.4 - 84.4 %   Platelets 147 (L) 150 - 400 K/uL   nRBC 0.0 0.0 - 0.2 %  Comprehensive metabolic panel     Status: Abnormal   Collection Time: 03/05/24  7:17 PM  Result Value Ref Range   Sodium 135 135 - 145 mmol/L   Potassium 3.6 3.5 - 5.1 mmol/L   Chloride 106 98 - 111 mmol/L   CO2 20 (L) 22 - 32 mmol/L   Glucose, Bld 119 (H) 70 - 99 mg/dL   BUN 12 6 - 20 mg/dL   Creatinine, Ser 9.16 0.44 - 1.00 mg/dL   Calcium 8.9 8.9 - 89.6 mg/dL   Total Protein 5.6 (L) 6.5 - 8.1 g/dL   Albumin 2.5 (L) 3.5 - 5.0 g/dL   AST 19 15 - 41 U/L   ALT 15 0 - 44 U/L    Alkaline Phosphatase 84 38 - 126 U/L   Total Bilirubin 0.6 0.0 - 1.2 mg/dL   GFR, Estimated >39 >39 mL/min   Anion gap 9 5 - 15    Imaging No results found.  MAU Course  Procedures Lab Orders         CBC         Comprehensive metabolic panel         Protein / creatinine ratio, urine         Urinalysis, Routine w reflex microscopic -Urine, Clean Catch    Meds ordered this encounter  Medications   acetaminophen  (TYLENOL ) tablet 1,000 mg   cyclobenzaprine  (FLEXERIL ) tablet 10 mg   Imaging Orders  No imaging studies ordered today    MDM Moderate (Level 3-4)  Assessment and Plan     #FWB: NST: Reactive   TAELER WINNING is a 33 y.o. at [redacted]w[redacted]d by [redacted]w[redacted]d US , who presents for elevated BP in the office.  -Has consistent mild range elevation in BP's in the MAU. -CBC with stable thrombocytopenia with platelet count of 147 stable from 6 days ago. -CMP without evidence of liver injury -P:C ratio 0.09 -Given tylenol  and flexeril  with moderate reduction in pain but  still endorsed headache. Offered headache cocktail but patient declined stating that it makes her feel funny.  -No urinary symptoms but U/A suspicious. Culture pending.   -Stable for discharge home -All questions answered, anticipatory guidance and detailed PEC precautions provided.     Tajia Szeliga L Terea Neubauer, MD/MHA 03/06/24 4:50 PM  Allergies as of 03/05/2024       Reactions   Amoxicillin Rash, Other (See Comments)   Has patient had a PCN reaction causing immediate rash, facial/tongue/throat swelling, SOB or lightheadedness with hypotension: No Has patient had a PCN reaction causing severe rash involving mucus membranes or skin necrosis: No Has patient had a PCN reaction that required hospitalization No Has patient had a PCN reaction occurring within the last 10 years: No If all of the above answers are NO, then may proceed with Cephalosporin use.        Medication List     STOP taking these medications     acetaminophen  325 MG tablet Commonly known as: TYLENOL    acetaminophen -caffeine  500-65 MG Tabs per tablet Commonly known as: EXCEDRIN TENSION HEADACHE   Blood Pressure Kit Devi   cyclobenzaprine  10 MG tablet Commonly known as: FLEXERIL    famotidine  20 MG tablet Commonly known as: Pepcid    metoCLOPramide  10 MG tablet Commonly known as: REGLAN    ondansetron  4 MG disintegrating tablet Commonly known as: ZOFRAN -ODT   promethazine  25 MG tablet Commonly known as: PHENERGAN    Vitafol  Gummies 3.33-0.333-34.8 MG Chew       TAKE these medications    albuterol  108 (90 Base) MCG/ACT inhaler Commonly known as: VENTOLIN  HFA Inhale 1-2 puffs into the lungs every 6 (six) hours as needed for wheezing or shortness of breath.   amLODipine  10 MG tablet Commonly known as: Norvasc  Take 1 tablet (10 mg total) by mouth daily.   aspirin  EC 81 MG tablet Take 2 tablets (162 mg total) by mouth daily. Start taking when you are [redacted] weeks pregnant for rest of pregnancy for prevention of preeclampsia   PRENATAL VITAMIN PO Take 1 tablet by mouth daily. 2 gummies

## 2024-03-06 ENCOUNTER — Encounter (HOSPITAL_COMMUNITY): Payer: Self-pay | Admitting: Obstetrics & Gynecology

## 2024-03-06 ENCOUNTER — Inpatient Hospital Stay (HOSPITAL_COMMUNITY)
Admission: AD | Admit: 2024-03-06 | Discharge: 2024-03-06 | Disposition: A | Discharge status: Home / Self Care | Attending: Obstetrics & Gynecology | Admitting: Obstetrics & Gynecology

## 2024-03-06 DIAGNOSIS — Z3480 Encounter for supervision of other normal pregnancy, unspecified trimester: Secondary | ICD-10-CM

## 2024-03-06 DIAGNOSIS — Z3A35 35 weeks gestation of pregnancy: Secondary | ICD-10-CM | POA: Diagnosis not present

## 2024-03-06 DIAGNOSIS — O10013 Pre-existing essential hypertension complicating pregnancy, third trimester: Secondary | ICD-10-CM | POA: Diagnosis not present

## 2024-03-06 DIAGNOSIS — O99353 Diseases of the nervous system complicating pregnancy, third trimester: Secondary | ICD-10-CM | POA: Diagnosis not present

## 2024-03-06 DIAGNOSIS — O10913 Unspecified pre-existing hypertension complicating pregnancy, third trimester: Secondary | ICD-10-CM | POA: Diagnosis not present

## 2024-03-06 DIAGNOSIS — O99891 Other specified diseases and conditions complicating pregnancy: Secondary | ICD-10-CM | POA: Diagnosis not present

## 2024-03-06 DIAGNOSIS — R55 Syncope and collapse: Secondary | ICD-10-CM | POA: Insufficient documentation

## 2024-03-06 DIAGNOSIS — G44209 Tension-type headache, unspecified, not intractable: Secondary | ICD-10-CM | POA: Insufficient documentation

## 2024-03-06 DIAGNOSIS — O10919 Unspecified pre-existing hypertension complicating pregnancy, unspecified trimester: Secondary | ICD-10-CM

## 2024-03-06 LAB — COMPREHENSIVE METABOLIC PANEL WITH GFR
ALT: 17 U/L (ref 0–44)
AST: 22 U/L (ref 15–41)
Albumin: 2.7 g/dL — ABNORMAL LOW (ref 3.5–5.0)
Alkaline Phosphatase: 87 U/L (ref 38–126)
Anion gap: 8 (ref 5–15)
BUN: 11 mg/dL (ref 6–20)
CO2: 22 mmol/L (ref 22–32)
Calcium: 8.9 mg/dL (ref 8.9–10.3)
Chloride: 104 mmol/L (ref 98–111)
Creatinine, Ser: 0.76 mg/dL (ref 0.44–1.00)
GFR, Estimated: >60 mL/min (ref >60)
Glucose, Bld: 80 mg/dL (ref 70–99)
Potassium: 4 mmol/L (ref 3.5–5.1)
Sodium: 134 mmol/L — ABNORMAL LOW (ref 135–145)
Total Bilirubin: 0.5 mg/dL (ref 0.0–1.2)
Total Protein: 6.2 g/dL — ABNORMAL LOW (ref 6.5–8.1)

## 2024-03-06 LAB — CBC
HCT: 34.6 % — ABNORMAL LOW (ref 36.0–46.0)
Hemoglobin: 11.7 g/dL — ABNORMAL LOW (ref 12.0–15.0)
MCH: 29.3 pg (ref 26.0–34.0)
MCHC: 33.8 g/dL (ref 30.0–36.0)
MCV: 86.7 fL (ref 80.0–100.0)
Platelets: 151 K/uL (ref 150–400)
RBC: 3.99 MIL/uL (ref 3.87–5.11)
RDW: 12.8 % (ref 11.5–15.5)
WBC: 10.4 K/uL (ref 4.0–10.5)
nRBC: 0 % (ref 0.0–0.2)

## 2024-03-06 LAB — PROTEIN / CREATININE RATIO, URINE
Creatinine, Urine: 202 mg/dL
Protein Creatinine Ratio: 0.11 mg/mg{creat} (ref 0.00–0.15)
Total Protein, Urine: 22 mg/dL

## 2024-03-06 MED ORDER — OXYCODONE HCL 5 MG PO TABS
5.0000 mg | ORAL_TABLET | Freq: Three times a day (TID) | ORAL | 0 refills | Status: AC | PRN
Start: 2024-03-06 — End: 2024-03-09

## 2024-03-06 MED ORDER — LACTATED RINGERS IV BOLUS
1000.0000 mL | Freq: Once | INTRAVENOUS | Status: AC
  Administered 2024-03-06: 1000 mL via INTRAVENOUS

## 2024-03-06 MED ORDER — DIPHENHYDRAMINE HCL 50 MG/ML IJ SOLN
25.0000 mg | Freq: Once | INTRAMUSCULAR | Status: AC
  Administered 2024-03-06: 25 mg via INTRAVENOUS
  Filled 2024-03-06: qty 1

## 2024-03-06 MED ORDER — PROCHLORPERAZINE EDISYLATE 10 MG/2ML IJ SOLN
10.0000 mg | Freq: Once | INTRAMUSCULAR | Status: AC
  Administered 2024-03-06: 10 mg via INTRAVENOUS
  Filled 2024-03-06: qty 2

## 2024-03-06 MED ORDER — DEXAMETHASONE SOD PHOSPHATE PF 10 MG/ML IJ SOLN
10.0000 mg | Freq: Once | INTRAMUSCULAR | Status: AC
  Administered 2024-03-06: 10 mg via INTRAVENOUS

## 2024-03-06 NOTE — MAU Provider Note (Cosign Needed)
 Chief Complaint:  Fall and Hypertension  HPI   Bailey Carney is a 33 y.o. G3P1011 at [redacted]w[redacted]d who presents to maternity admissions reporting persistently high blood pressures and recent fall. Around 4 pm today, patient reported feeling dizzy and light headed and falling on the left side of her body without LOC or headstrike. She then after felt intermittent numbness and tingling in her left arm and right side of her face. She denies VB, abnormal discharge, abdominal pain, LOF, or regular contractions. She took her BP at home and reports a severe range BP of 152/115. She denies blurry vision, but notes she has been seeing spots.    Past Medical History:  Diagnosis Date   Asthma    does not use inhaler often   Chlamydia    Cholecystitis    Genital herpes    Hypertension    on meds   OB History  Gravida Para Term Preterm AB Living  3 1 1  0 1 1  SAB IAB Ectopic Multiple Live Births  1 0 0 0 1    # Outcome Date GA Lbr Len/2nd Weight Sex Type Anes PTL Lv  3 Current           2 SAB 2023     SAB     1 Term 04/18/18 [redacted]w[redacted]d 03:03 / 00:02 2810 g M Vag-Spont EPI  LIV   Past Surgical History:  Procedure Laterality Date   NO PAST SURGERIES     Family History  Problem Relation Age of Onset   Hypertension Mother    Breast cancer Maternal Grandmother    Hypertension Maternal Grandmother    Cancer Maternal Grandmother    Dementia Maternal Grandmother    Cancer Maternal Grandfather    Breast cancer Paternal Grandmother    Cancer Paternal Grandmother    Social History   Tobacco Use   Smoking status: Never   Smokeless tobacco: Never  Vaping Use   Vaping status: Never Used  Substance Use Topics   Alcohol use: Not Currently    Comment: socially   Drug use: No   Allergies  Allergen Reactions   Amoxicillin Rash and Other (See Comments)    Has patient had a PCN reaction causing immediate rash, facial/tongue/throat swelling, SOB or lightheadedness with hypotension: No Has patient had a  PCN reaction causing severe rash involving mucus membranes or skin necrosis: No Has patient had a PCN reaction that required hospitalization No Has patient had a PCN reaction occurring within the last 10 years: No If all of the above answers are NO, then may proceed with Cephalosporin use.    Medications Prior to Admission  Medication Sig Dispense Refill Last Dose/Taking   amLODipine  (NORVASC ) 10 MG tablet Take 1 tablet (10 mg total) by mouth daily. 30 tablet 6 03/06/2024 at  8:00 AM   albuterol  (PROVENTIL  HFA;VENTOLIN  HFA) 108 (90 Base) MCG/ACT inhaler Inhale 1-2 puffs into the lungs every 6 (six) hours as needed for wheezing or shortness of breath. 1 Inhaler 0    aspirin  EC 81 MG tablet Take 2 tablets (162 mg total) by mouth daily. Start taking when you are [redacted] weeks pregnant for rest of pregnancy for prevention of preeclampsia 300 tablet 2    Prenatal Vit-Fe Fumarate-FA (PRENATAL VITAMIN PO) Take 1 tablet by mouth daily. 2 gummies       I have reviewed patient's Past Medical Hx, Surgical Hx, Family Hx, Social Hx, medications and allergies.   ROS  Pertinent items noted in  HPI and remainder of comprehensive ROS otherwise negative.   PHYSICAL EXAM  Patient Vitals for the past 24 hrs:  BP Temp Temp src Pulse Resp SpO2 Height Weight  03/06/24 2040 126/83 -- -- 73 -- 98 % -- --  03/06/24 2000 113/78 -- -- 75 -- -- -- --  03/06/24 1947 111/73 -- -- 72 -- -- -- --  03/06/24 1930 128/79 -- -- 66 -- -- -- --  03/06/24 1915 (!) 139/94 -- -- 74 -- 100 % -- --  03/06/24 1857 (!) 154/96 97.8 F (36.6 C) Oral 80 18 100 % -- --  03/06/24 1851 -- -- -- -- -- -- 5' 7 (1.702 m) 122 kg    Constitutional: Well-developed, well-nourished female in no acute distress.  Cardiovascular: normal rate & rhythm, warm and well-perfused Respiratory: normal effort, no problems with respiration noted GI: Abd soft, non-tender, gravid MS: Extremities nontender, no edema Neurologic: Alert and oriented x 4.    Fetal Tracing: Baseline: 135bpm Variability: moderate Accelerations: + Decelerations: - Toco: stable   Labs: Results for orders placed or performed during the hospital encounter of 03/06/24 (from the past 24 hours)  Protein / creatinine ratio, urine     Status: None   Collection Time: 03/06/24  7:21 PM  Result Value Ref Range   Creatinine, Urine 202 mg/dL   Total Protein, Urine 22 mg/dL   Protein Creatinine Ratio 0.11 0.00 - 0.15 mg/mg[Cre]  CBC     Status: Abnormal   Collection Time: 03/06/24  7:22 PM  Result Value Ref Range   WBC 10.4 4.0 - 10.5 K/uL   RBC 3.99 3.87 - 5.11 MIL/uL   Hemoglobin 11.7 (L) 12.0 - 15.0 g/dL   HCT 65.3 (L) 63.9 - 53.9 %   MCV 86.7 80.0 - 100.0 fL   MCH 29.3 26.0 - 34.0 pg   MCHC 33.8 30.0 - 36.0 g/dL   RDW 87.1 88.4 - 84.4 %   Platelets 151 150 - 400 K/uL   nRBC 0.0 0.0 - 0.2 %  Comprehensive metabolic panel     Status: Abnormal   Collection Time: 03/06/24  7:22 PM  Result Value Ref Range   Sodium 134 (L) 135 - 145 mmol/L   Potassium 4.0 3.5 - 5.1 mmol/L   Chloride 104 98 - 111 mmol/L   CO2 22 22 - 32 mmol/L   Glucose, Bld 80 70 - 99 mg/dL   BUN 11 6 - 20 mg/dL   Creatinine, Ser 9.23 0.44 - 1.00 mg/dL   Calcium 8.9 8.9 - 89.6 mg/dL   Total Protein 6.2 (L) 6.5 - 8.1 g/dL   Albumin 2.7 (L) 3.5 - 5.0 g/dL   AST 22 15 - 41 U/L   ALT 17 0 - 44 U/L   Alkaline Phosphatase 87 38 - 126 U/L   Total Bilirubin 0.5 0.0 - 1.2 mg/dL   GFR, Estimated >39 >39 mL/min   Anion gap 8 5 - 15    Imaging:  No results found.  MDM & MAU COURSE  MDM: Moderate  MAU Course: Orders Placed This Encounter  Procedures   CBC   Comprehensive metabolic panel   Protein / creatinine ratio, urine   Discharge patient Discharge disposition: 01-Home or Self Care; Discharge patient date: 03/06/2024   Meds ordered this encounter  Medications   lactated ringers  bolus 1,000 mL   prochlorperazine  (COMPAZINE ) injection 10 mg   diphenhydrAMINE  (BENADRYL ) injection  25 mg   dexamethasone  (DECADRON ) injection 10 mg  oxyCODONE  (ROXICODONE ) 5 MG immediate release tablet    Sig: Take 1 tablet (5 mg total) by mouth every 8 (eight) hours as needed for up to 3 days.    Dispense:  10 tablet    Refill:  0   Medications ordered as stated below.  A/P as described below.  Counseling and education provided and patient agreeable  with plan as described below. Verbalized understanding.    ASSESSMENT   1. Supervision of other normal pregnancy, antepartum   2. Chronic hypertension affecting pregnancy   3. Acute non intractable tension-type headache   4. Near syncope   5. [redacted] weeks gestation of pregnancy     PLAN  Patient currently has high BP upon arrival to the MAU (154/96). Given history of cHTN with new onset of neurologic features like changes in vision, dizziness, and recent fall, it is important to consider further monitoring to distinguish preeclampsia with or without severe features. Upon further monitoring in MAU, patient BP has been reassuring and normal with complete resolution of HA with medication regimen administered as above. Patient has no neurologic abnormalities upon furthering monitoring in MAU.  - Patient offered CT scan in the context of recent fall with information given regarding risks of radiation and declines.  - Patient amenable to discharge with pain medication for management after this fall. - oxycodone  5mg  q8h  See AVS for full description of information given to the patient including both verbal and written. Patient verbalized understanding and agrees with the plan as described above.  Brad Prey, MS3 [facility], [facility]    Attestation of Supervision of Student:  I confirm that I have verified the information documented in the medical student's note and that I have also personally reperformed the history, physical exam and all medical decision making activities.  I have verified that all  services and findings are accurately documented in this student's note; and I agree with management and plan as outlined in the documentation. I have also made any necessary editorial changes.  Patient with initially intractable headache with no resolution with tylenol  and flexeril . She was seen in MAU yesterday with a similar complaint. Given benadryl , compazine , and decadron  with initially no resolution in headache. She took a nap and when she woke she stated headache was gone and had complete resolution of symptoms. PIH labs reassuring and patient normotensive by the time she discharged. Reflexes normal. Due to near syncopal event at home with fall, discussed CT scan to rule out other underlying pathology that could be causing neuro symptoms. Patient declines at this time due to radiation risk. Patient stable for discharge home. Strict return precautions provided.   Bailey LITTIE Angles, MD OB Fellow 03/06/2024 8:43 PM

## 2024-03-06 NOTE — MAU Provider Note (Incomplete Revision)
 Chief Complaint:  Fall and Hypertension  HPI   Bailey Carney is a 33 y.o. G3P1011 at [redacted]w[redacted]d who presents to maternity admissions reporting persistently high blood pressures and recent fall. Around 4 pm today, patient reported feeling dizzy and light headed and falling on the left side of her body without LOC or headstrike. She then after felt intermittent numbness and tingling in her left arm and right side of her face. She denies VB, abnormal discharge, abdominal pain, LOF, or regular contractions. She took her BP at home and reports a severe range BP of 152/115. She denies blurry vision, but notes she has been seeing spots.    Past Medical History:  Diagnosis Date   Asthma    does not use inhaler often   Chlamydia    Cholecystitis    Genital herpes    Hypertension    on meds   OB History  Gravida Para Term Preterm AB Living  3 1 1  0 1 1  SAB IAB Ectopic Multiple Live Births  1 0 0 0 1    # Outcome Date GA Lbr Len/2nd Weight Sex Type Anes PTL Lv  3 Current           2 SAB 2023     SAB     1 Term 04/18/18 [redacted]w[redacted]d 03:03 / 00:02 2810 g M Vag-Spont EPI  LIV   Past Surgical History:  Procedure Laterality Date   NO PAST SURGERIES     Family History  Problem Relation Age of Onset   Hypertension Mother    Breast cancer Maternal Grandmother    Hypertension Maternal Grandmother    Cancer Maternal Grandmother    Dementia Maternal Grandmother    Cancer Maternal Grandfather    Breast cancer Paternal Grandmother    Cancer Paternal Grandmother    Social History   Tobacco Use   Smoking status: Never   Smokeless tobacco: Never  Vaping Use   Vaping status: Never Used  Substance Use Topics   Alcohol use: Not Currently    Comment: socially   Drug use: No   Allergies  Allergen Reactions   Amoxicillin Rash and Other (See Comments)    Has patient had a PCN reaction causing immediate rash, facial/tongue/throat swelling, SOB or lightheadedness with hypotension: No Has patient had a  PCN reaction causing severe rash involving mucus membranes or skin necrosis: No Has patient had a PCN reaction that required hospitalization No Has patient had a PCN reaction occurring within the last 10 years: No If all of the above answers are NO, then may proceed with Cephalosporin use.    Medications Prior to Admission  Medication Sig Dispense Refill Last Dose/Taking   amLODipine  (NORVASC ) 10 MG tablet Take 1 tablet (10 mg total) by mouth daily. 30 tablet 6 03/06/2024 at  8:00 AM   albuterol  (PROVENTIL  HFA;VENTOLIN  HFA) 108 (90 Base) MCG/ACT inhaler Inhale 1-2 puffs into the lungs every 6 (six) hours as needed for wheezing or shortness of breath. 1 Inhaler 0    aspirin  EC 81 MG tablet Take 2 tablets (162 mg total) by mouth daily. Start taking when you are [redacted] weeks pregnant for rest of pregnancy for prevention of preeclampsia 300 tablet 2    Prenatal Vit-Fe Fumarate-FA (PRENATAL VITAMIN PO) Take 1 tablet by mouth daily. 2 gummies       I have reviewed patient's Past Medical Hx, Surgical Hx, Family Hx, Social Hx, medications and allergies.   ROS  Pertinent items noted in  HPI and remainder of comprehensive ROS otherwise negative.   PHYSICAL EXAM  Patient Vitals for the past 24 hrs:  BP Temp Temp src Pulse Resp SpO2 Height Weight  03/06/24 2040 126/83 -- -- 73 -- 98 % -- --  03/06/24 2000 113/78 -- -- 75 -- -- -- --  03/06/24 1947 111/73 -- -- 72 -- -- -- --  03/06/24 1930 128/79 -- -- 66 -- -- -- --  03/06/24 1915 (!) 139/94 -- -- 74 -- 100 % -- --  03/06/24 1857 (!) 154/96 97.8 F (36.6 C) Oral 80 18 100 % -- --  03/06/24 1851 -- -- -- -- -- -- 5' 7 (1.702 m) 122 kg    Constitutional: Well-developed, well-nourished female in no acute distress.  Cardiovascular: normal rate & rhythm, warm and well-perfused Respiratory: normal effort, no problems with respiration noted GI: Abd soft, non-tender, gravid MS: Extremities nontender, no edema Neurologic: Alert and oriented x 4.    Fetal Tracing: Baseline: 135bpm Variability: moderate Accelerations: + Decelerations: - Toco: stable   Labs: Results for orders placed or performed during the hospital encounter of 03/06/24 (from the past 24 hours)  Protein / creatinine ratio, urine     Status: None   Collection Time: 03/06/24  7:21 PM  Result Value Ref Range   Creatinine, Urine 202 mg/dL   Total Protein, Urine 22 mg/dL   Protein Creatinine Ratio 0.11 0.00 - 0.15 mg/mg[Cre]  CBC     Status: Abnormal   Collection Time: 03/06/24  7:22 PM  Result Value Ref Range   WBC 10.4 4.0 - 10.5 K/uL   RBC 3.99 3.87 - 5.11 MIL/uL   Hemoglobin 11.7 (L) 12.0 - 15.0 g/dL   HCT 65.3 (L) 63.9 - 53.9 %   MCV 86.7 80.0 - 100.0 fL   MCH 29.3 26.0 - 34.0 pg   MCHC 33.8 30.0 - 36.0 g/dL   RDW 87.1 88.4 - 84.4 %   Platelets 151 150 - 400 K/uL   nRBC 0.0 0.0 - 0.2 %  Comprehensive metabolic panel     Status: Abnormal   Collection Time: 03/06/24  7:22 PM  Result Value Ref Range   Sodium 134 (L) 135 - 145 mmol/L   Potassium 4.0 3.5 - 5.1 mmol/L   Chloride 104 98 - 111 mmol/L   CO2 22 22 - 32 mmol/L   Glucose, Bld 80 70 - 99 mg/dL   BUN 11 6 - 20 mg/dL   Creatinine, Ser 9.23 0.44 - 1.00 mg/dL   Calcium 8.9 8.9 - 89.6 mg/dL   Total Protein 6.2 (L) 6.5 - 8.1 g/dL   Albumin 2.7 (L) 3.5 - 5.0 g/dL   AST 22 15 - 41 U/L   ALT 17 0 - 44 U/L   Alkaline Phosphatase 87 38 - 126 U/L   Total Bilirubin 0.5 0.0 - 1.2 mg/dL   GFR, Estimated >39 >39 mL/min   Anion gap 8 5 - 15    Imaging:  No results found.  MDM & MAU COURSE  MDM: Moderate  MAU Course: Orders Placed This Encounter  Procedures   CBC   Comprehensive metabolic panel   Protein / creatinine ratio, urine   Discharge patient Discharge disposition: 01-Home or Self Care; Discharge patient date: 03/06/2024   Meds ordered this encounter  Medications   lactated ringers  bolus 1,000 mL   prochlorperazine  (COMPAZINE ) injection 10 mg   diphenhydrAMINE  (BENADRYL ) injection  25 mg   dexamethasone  (DECADRON ) injection 10 mg  oxyCODONE  (ROXICODONE ) 5 MG immediate release tablet    Sig: Take 1 tablet (5 mg total) by mouth every 8 (eight) hours as needed for up to 3 days.    Dispense:  10 tablet    Refill:  0   Medications ordered as stated below.  A/P as described below.  Counseling and education provided and patient agreeable  with plan as described below. Verbalized understanding.    ASSESSMENT   1. Supervision of other normal pregnancy, antepartum   2. Chronic hypertension affecting pregnancy   3. Acute non intractable tension-type headache   4. Near syncope   5. [redacted] weeks gestation of pregnancy     PLAN  Patient currently has high BP upon arrival to the MAU (154/96). Given history of cHTN with new onset of neurologic features like changes in vision, dizziness, and recent fall, it is important to consider further monitoring to distinguish preeclampsia with or without severe features. Upon further monitoring in MAU, patient BP has been reassuring and normal with complete resolution of HA with medication regimen administered as above. Patient has no neurologic abnormalities upon furthering monitoring in MAU.  - Patient offered CT scan in the context of recent fall with information given regarding risks of radiation and declines.  - Patient amenable to discharge with pain medication for management after this fall. - oxycodone  5mg  q8h  See AVS for full description of information given to the patient including both verbal and written. Patient verbalized understanding and agrees with the plan as described above.  Brad Prey, MS3 [facility], [facility]    Attestation of Supervision of Student:  I confirm that I have verified the information documented in the medical student's note and that I have also personally reperformed the history, physical exam and all medical decision making activities.  I have verified that all  services and findings are accurately documented in this student's note; and I agree with management and plan as outlined in the documentation. I have also made any necessary editorial changes.  Patient with initially intractable headache with no resolution with tylenol  and flexeril . She was seen in MAU yesterday with a similar complaint. Given benadryl , compazine , and decadron  with initially no resolution in headache. She took a nap and when she woke she stated headache was gone and had complete resolution of symptoms. PIH labs reassuring and patient normotensive by the time she discharged. Reflexes normal. Due to near syncopal event at home with fall, discussed CT scan to rule out other underlying pathology that could be causing neuro symptoms. Patient declines at this time due to radiation risk. Patient stable for discharge home. Strict return precautions provided.   Barkley LITTIE Angles, MD OB Fellow 03/06/2024 8:43 PM

## 2024-03-06 NOTE — MAU Note (Signed)
.  Bailey Carney is a 33 y.o. at [redacted]w[redacted]d here in MAU reporting: HA, RUQ pain, and fell earlier this afternoon getting out of the shower. Pt denies CTX, VB, and LOF. Reports +FM. States upon getting out of shower she was dizzy and lightheaded then fell onto her L side and bottom. Denies hitting her stomach. Pt has c/o tingling and numbness in her face and hands. Pt was seen yesterday in MAU for elevated Bps, she states the highest one today was 152/115 at home and has experienced a HA all day, vision changes earlier today, and has felt no relief from tylenol  or flexeril .  LMP: na Onset of complaint: this morning Pain score: 9/10 -HA There were no vitals filed for this visit.   FHT: 150  Lab orders placed from triage: na

## 2024-03-09 ENCOUNTER — Telehealth (HOSPITAL_COMMUNITY): Payer: Self-pay | Admitting: *Deleted

## 2024-03-09 ENCOUNTER — Encounter (HOSPITAL_COMMUNITY): Payer: Self-pay | Admitting: *Deleted

## 2024-03-09 NOTE — Telephone Encounter (Signed)
 Preadmission screen

## 2024-03-10 ENCOUNTER — Inpatient Hospital Stay (HOSPITAL_COMMUNITY)

## 2024-03-10 ENCOUNTER — Encounter: Admitting: Obstetrics & Gynecology

## 2024-03-10 ENCOUNTER — Other Ambulatory Visit (HOSPITAL_COMMUNITY)
Admission: RE | Admit: 2024-03-10 | Discharge: 2024-03-10 | Disposition: A | Discharge status: Home / Self Care | Source: Ambulatory Visit

## 2024-03-10 ENCOUNTER — Other Ambulatory Visit: Payer: Self-pay

## 2024-03-10 ENCOUNTER — Inpatient Hospital Stay (HOSPITAL_COMMUNITY)
Admission: AD | Admit: 2024-03-10 | Discharge: 2024-03-13 | DRG: 806 | Disposition: A | Discharge status: Home / Self Care | Attending: Family Medicine | Admitting: Family Medicine

## 2024-03-10 ENCOUNTER — Ambulatory Visit (INDEPENDENT_AMBULATORY_CARE_PROVIDER_SITE_OTHER): Admitting: Advanced Practice Midwife

## 2024-03-10 ENCOUNTER — Encounter (HOSPITAL_COMMUNITY): Payer: Self-pay | Admitting: Obstetrics and Gynecology

## 2024-03-10 VITALS — BP 148/99 | HR 73 | Wt 269.0 lb

## 2024-03-10 DIAGNOSIS — O26893 Other specified pregnancy related conditions, third trimester: Secondary | ICD-10-CM | POA: Diagnosis not present

## 2024-03-10 DIAGNOSIS — O99214 Obesity complicating childbirth: Secondary | ICD-10-CM | POA: Diagnosis present

## 2024-03-10 DIAGNOSIS — O9832 Other infections with a predominantly sexual mode of transmission complicating childbirth: Secondary | ICD-10-CM | POA: Diagnosis not present

## 2024-03-10 DIAGNOSIS — I10 Essential (primary) hypertension: Secondary | ICD-10-CM | POA: Diagnosis not present

## 2024-03-10 DIAGNOSIS — O164 Unspecified maternal hypertension, complicating childbirth: Secondary | ICD-10-CM | POA: Diagnosis not present

## 2024-03-10 DIAGNOSIS — O0993 Supervision of high risk pregnancy, unspecified, third trimester: Secondary | ICD-10-CM

## 2024-03-10 DIAGNOSIS — R519 Headache, unspecified: Secondary | ICD-10-CM | POA: Diagnosis not present

## 2024-03-10 DIAGNOSIS — Z3A36 36 weeks gestation of pregnancy: Secondary | ICD-10-CM | POA: Diagnosis not present

## 2024-03-10 DIAGNOSIS — A6000 Herpesviral infection of urogenital system, unspecified: Secondary | ICD-10-CM | POA: Diagnosis not present

## 2024-03-10 DIAGNOSIS — E66813 Obesity, class 3: Secondary | ICD-10-CM | POA: Diagnosis present

## 2024-03-10 DIAGNOSIS — Z8249 Family history of ischemic heart disease and other diseases of the circulatory system: Secondary | ICD-10-CM

## 2024-03-10 DIAGNOSIS — Z880 Allergy status to penicillin: Secondary | ICD-10-CM

## 2024-03-10 DIAGNOSIS — O1092 Unspecified pre-existing hypertension complicating childbirth: Secondary | ICD-10-CM | POA: Diagnosis not present

## 2024-03-10 DIAGNOSIS — O114 Pre-existing hypertension with pre-eclampsia, complicating childbirth: Secondary | ICD-10-CM | POA: Diagnosis not present

## 2024-03-10 DIAGNOSIS — Z79899 Other long term (current) drug therapy: Secondary | ICD-10-CM | POA: Diagnosis not present

## 2024-03-10 DIAGNOSIS — O99210 Obesity complicating pregnancy, unspecified trimester: Secondary | ICD-10-CM

## 2024-03-10 DIAGNOSIS — O1413 Severe pre-eclampsia, third trimester: Principal | ICD-10-CM

## 2024-03-10 DIAGNOSIS — Z3480 Encounter for supervision of other normal pregnancy, unspecified trimester: Secondary | ICD-10-CM

## 2024-03-10 DIAGNOSIS — O1414 Severe pre-eclampsia complicating childbirth: Secondary | ICD-10-CM | POA: Diagnosis not present

## 2024-03-10 LAB — TYPE AND SCREEN
ABO/RH(D): B POS
Antibody Screen: NEGATIVE

## 2024-03-10 LAB — CBC
HCT: 33 % — ABNORMAL LOW (ref 36.0–46.0)
HCT: 36.9 % (ref 36.0–46.0)
Hemoglobin: 11.1 g/dL — ABNORMAL LOW (ref 12.0–15.0)
Hemoglobin: 12.5 g/dL (ref 12.0–15.0)
MCH: 29.2 pg (ref 26.0–34.0)
MCH: 29.4 pg (ref 26.0–34.0)
MCHC: 33.6 g/dL (ref 30.0–36.0)
MCHC: 33.9 g/dL (ref 30.0–36.0)
MCV: 86.8 fL (ref 80.0–100.0)
MCV: 86.8 fL (ref 80.0–100.0)
Platelets: 144 K/uL — ABNORMAL LOW (ref 150–400)
Platelets: 147 K/uL — ABNORMAL LOW (ref 150–400)
RBC: 3.8 MIL/uL — ABNORMAL LOW (ref 3.87–5.11)
RBC: 4.25 MIL/uL (ref 3.87–5.11)
RDW: 12.9 % (ref 11.5–15.5)
RDW: 12.8 % (ref 11.5–15.5)
WBC: 10.1 K/uL (ref 4.0–10.5)
WBC: 11.1 K/uL — ABNORMAL HIGH (ref 4.0–10.5)
nRBC: 0 % (ref 0.0–0.2)
nRBC: 0 % (ref 0.0–0.2)

## 2024-03-10 LAB — COMPREHENSIVE METABOLIC PANEL WITH GFR
ALT: 17 U/L (ref 0–44)
AST: 20 U/L (ref 15–41)
Albumin: 2.5 g/dL — ABNORMAL LOW (ref 3.5–5.0)
Alkaline Phosphatase: 89 U/L (ref 38–126)
Anion gap: 11 (ref 5–15)
BUN: 11 mg/dL (ref 6–20)
CO2: 22 mmol/L (ref 22–32)
Calcium: 8.8 mg/dL — ABNORMAL LOW (ref 8.9–10.3)
Chloride: 104 mmol/L (ref 98–111)
Creatinine, Ser: 0.81 mg/dL (ref 0.44–1.00)
GFR, Estimated: >60 mL/min (ref >60)
Glucose, Bld: 83 mg/dL (ref 70–99)
Potassium: 3.5 mmol/L (ref 3.5–5.1)
Sodium: 137 mmol/L (ref 135–145)
Total Bilirubin: 0.8 mg/dL (ref 0.0–1.2)
Total Protein: 5.6 g/dL — ABNORMAL LOW (ref 6.5–8.1)

## 2024-03-10 LAB — PROTEIN / CREATININE RATIO, URINE
Creatinine, Urine: 256 mg/dL
Protein Creatinine Ratio: 0.09 mg/mg{creat} (ref 0.00–0.15)
Total Protein, Urine: 23 mg/dL

## 2024-03-10 LAB — HIV ANTIBODY (ROUTINE TESTING W REFLEX): HIV Screen 4th Generation wRfx: NONREACTIVE

## 2024-03-10 MED ORDER — OXYTOCIN BOLUS FROM INFUSION
333.0000 mL | Freq: Once | INTRAVENOUS | Status: AC
  Administered 2024-03-11: 333 mL via INTRAVENOUS

## 2024-03-10 MED ORDER — SUMATRIPTAN SUCCINATE 50 MG PO TABS
50.0000 mg | ORAL_TABLET | Freq: Once | ORAL | Status: AC
  Administered 2024-03-10: 50 mg via ORAL
  Filled 2024-03-10: qty 1

## 2024-03-10 MED ORDER — ONDANSETRON HCL 4 MG/2ML IJ SOLN
4.0000 mg | Freq: Four times a day (QID) | INTRAMUSCULAR | Status: DC | PRN
  Administered 2024-03-11: 4 mg via INTRAVENOUS
  Filled 2024-03-10: qty 2

## 2024-03-10 MED ORDER — MISOPROSTOL 50MCG HALF TABLET
50.0000 ug | ORAL_TABLET | Freq: Once | ORAL | Status: AC
  Administered 2024-03-10: 50 ug via ORAL
  Filled 2024-03-10: qty 1

## 2024-03-10 MED ORDER — LABETALOL HCL 200 MG PO TABS: 200.0000 mg | ORAL_TABLET | Freq: Two times a day (BID) | ORAL | 1 refills | Status: DC

## 2024-03-10 MED ORDER — CEFAZOLIN SODIUM-DEXTROSE 1-4 GM/50ML-% IV SOLN
1.0000 g | Freq: Three times a day (TID) | INTRAVENOUS | Status: DC
  Administered 2024-03-11: 1 g via INTRAVENOUS
  Filled 2024-03-10: qty 50

## 2024-03-10 MED ORDER — MAGNESIUM SULFATE BOLUS VIA INFUSION
4.0000 g | Freq: Once | INTRAVENOUS | Status: AC
  Administered 2024-03-10: 4 g via INTRAVENOUS
  Filled 2024-03-10: qty 1000

## 2024-03-10 MED ORDER — LABETALOL HCL 5 MG/ML IV SOLN: 80.0000 mg | INTRAVENOUS | Status: DC | PRN

## 2024-03-10 MED ORDER — LABETALOL HCL 5 MG/ML IV SOLN: 40.0000 mg | INTRAVENOUS | Status: DC | PRN

## 2024-03-10 MED ORDER — MAGNESIUM SULFATE 40 GM/1000ML IV SOLN
2.0000 g/h | INTRAVENOUS | Status: DC
  Administered 2024-03-11: 2 g/h via INTRAVENOUS

## 2024-03-10 MED ORDER — OXYCODONE-ACETAMINOPHEN 5-325 MG PO TABS: 2.0000 | ORAL_TABLET | ORAL | Status: DC | PRN

## 2024-03-10 MED ORDER — SOD CITRATE-CITRIC ACID 500-334 MG/5ML PO SOLN: 30.0000 mL | ORAL | Status: DC | PRN

## 2024-03-10 MED ORDER — FENTANYL-BUPIVACAINE-NACL 0.5-0.125-0.9 MG/250ML-% EP SOLN
12.0000 mL/h | EPIDURAL | Status: DC | PRN
  Administered 2024-03-11: 12 mL/h via EPIDURAL
  Filled 2024-03-10: qty 250

## 2024-03-10 MED ORDER — DIPHENHYDRAMINE HCL 50 MG/ML IJ SOLN: 12.5000 mg | INTRAMUSCULAR | Status: DC | PRN

## 2024-03-10 MED ORDER — PHENYLEPHRINE 80 MCG/ML (10ML) SYRINGE FOR IV PUSH (FOR BLOOD PRESSURE SUPPORT): 80.0000 ug | PREFILLED_SYRINGE | INTRAVENOUS | Status: DC | PRN

## 2024-03-10 MED ORDER — OXYCODONE-ACETAMINOPHEN 5-325 MG PO TABS: 1.0000 | ORAL_TABLET | ORAL | Status: DC | PRN

## 2024-03-10 MED ORDER — LACTATED RINGERS IV SOLN
500.0000 mL | Freq: Once | INTRAVENOUS | Status: AC
  Administered 2024-03-11: 500 mL via INTRAVENOUS

## 2024-03-10 MED ORDER — MAGNESIUM SULFATE 40 GM/1000ML IV SOLN
INTRAVENOUS | Status: AC
  Filled 2024-03-10: qty 1000

## 2024-03-10 MED ORDER — OXYTOCIN-SODIUM CHLORIDE 30-0.9 UT/500ML-% IV SOLN
2.5000 [IU]/h | INTRAVENOUS | Status: DC
  Filled 2024-03-10: qty 500

## 2024-03-10 MED ORDER — LACTATED RINGERS IV SOLN: INTRAVENOUS | Status: DC

## 2024-03-10 MED ORDER — CEFAZOLIN SODIUM-DEXTROSE 2-4 GM/100ML-% IV SOLN
2.0000 g | Freq: Once | INTRAVENOUS | Status: AC
  Administered 2024-03-10: 2 g via INTRAVENOUS
  Filled 2024-03-10: qty 100

## 2024-03-10 MED ORDER — LACTATED RINGERS IV SOLN: 500.0000 mL | INTRAVENOUS | Status: DC | PRN

## 2024-03-10 MED ORDER — MISOPROSTOL 25 MCG QUARTER TABLET
25.0000 ug | ORAL_TABLET | Freq: Once | ORAL | Status: AC
Start: 2024-03-10 — End: 2024-03-10
  Administered 2024-03-10: 25 ug via VAGINAL
  Filled 2024-03-10: qty 1

## 2024-03-10 MED ORDER — FENTANYL CITRATE (PF) 100 MCG/2ML IJ SOLN
50.0000 ug | INTRAMUSCULAR | Status: DC | PRN
  Administered 2024-03-11: 100 ug via INTRAVENOUS
  Filled 2024-03-10: qty 2

## 2024-03-10 MED ORDER — ACETAMINOPHEN 325 MG PO TABS
650.0000 mg | ORAL_TABLET | ORAL | Status: DC | PRN
  Administered 2024-03-10 – 2024-03-11 (×2): 650 mg via ORAL
  Filled 2024-03-10 (×2): qty 2

## 2024-03-10 MED ORDER — LABETALOL HCL 5 MG/ML IV SOLN: 20.0000 mg | INTRAVENOUS | Status: DC | PRN

## 2024-03-10 MED ORDER — EPHEDRINE 5 MG/ML INJ
10.0000 mg | INTRAVENOUS | Status: DC | PRN
Start: 2024-03-10 — End: 2024-03-11

## 2024-03-10 MED ORDER — TERBUTALINE SULFATE 1 MG/ML IJ SOLN: 0.2500 mg | Freq: Once | INTRAMUSCULAR | Status: DC | PRN

## 2024-03-10 MED ORDER — PHENYLEPHRINE 80 MCG/ML (10ML) SYRINGE FOR IV PUSH (FOR BLOOD PRESSURE SUPPORT)
80.0000 ug | PREFILLED_SYRINGE | INTRAVENOUS | Status: DC | PRN
Start: 2024-03-10 — End: 2024-03-11
  Administered 2024-03-11: 80 ug via INTRAVENOUS
  Filled 2024-03-10: qty 10

## 2024-03-10 MED ORDER — HYDRALAZINE HCL 20 MG/ML IJ SOLN: 10.0000 mg | INTRAMUSCULAR | Status: DC | PRN

## 2024-03-10 MED ORDER — EPHEDRINE 5 MG/ML INJ: 10.0000 mg | INTRAVENOUS | Status: DC | PRN

## 2024-03-10 MED ORDER — LIDOCAINE HCL (PF) 1 % IJ SOLN: 30.0000 mL | INTRAMUSCULAR | Status: DC | PRN

## 2024-03-10 NOTE — MAU Note (Signed)
 RN called main lab to inquire about status of patient's labs - lab tech informed RN that they will keep an eye out for the sample that was sent on prior shift.

## 2024-03-10 NOTE — Progress Notes (Signed)
 PRENATAL VISIT NOTE  Subjective:  Bailey Carney is a 33 y.o. G3P1011 at [redacted]w[redacted]d being seen today for ongoing prenatal care.  She is currently monitored for the following issues for this high-risk pregnancy and has Asthma; Hypertension; History of postpartum depression; Hirsutism; Supervision of other normal pregnancy, antepartum; Chronic hypertension; and Obesity affecting pregnancy, antepartum on their problem list.  Patient reports headache, blurry vision.  Contractions: Irritability. Vag. Bleeding: None.  Movement: Present. Denies leaking of fluid.   The following portions of the patient's history were reviewed and updated as appropriate: allergies, current medications, past family history, past medical history, past social history, past surgical history and problem list.   Objective:   Vitals:   03/10/24 1429 03/10/24 1432  BP: (!) 139/94 (!) 148/99  Pulse: 68 73  Weight: 269 lb (122 kg)     Fetal Status:  Fetal Heart Rate (bpm): 140 Fundal Height: 37 cm Movement: Present Presentation: Vertex  General: Alert, oriented and cooperative. Patient is in no acute distress.  Skin: Skin is warm and dry. No rash noted.   Cardiovascular: Normal heart rate noted  Respiratory: Normal respiratory effort, no problems with respiration noted  Abdomen: Soft, gravid, appropriate for gestational age.  Pain/Pressure: Present     Pelvic: Cervical exam performed in the presence of a chaperone Dilation: 1.5 Effacement (%): 50 Station: -2  Extremities: Normal range of motion.  Edema: None  Mental Status: Normal mood and affect. Normal behavior. Normal judgment and thought content.      03/05/2024    3:03 PM 01/15/2024    9:06 AM 09/24/2023    4:07 PM  Depression screen PHQ 2/9  Decreased Interest 1 0 2  Down, Depressed, Hopeless 0 0 1  PHQ - 2 Score 1 0 3  Altered sleeping 0 2 3  Tired, decreased energy 1 2 3   Change in appetite 0 0 1  Feeling bad or failure about yourself  0 0 0  Trouble  concentrating 0 0 0  Moving slowly or fidgety/restless 0 0 0  Suicidal thoughts 0 0 0  PHQ-9 Score 2 4  10    Difficult doing work/chores  Not difficult at all      Data saved with a previous flowsheet row definition        03/05/2024    3:04 PM 01/15/2024    9:07 AM 09/24/2023    4:07 PM 08/22/2023    9:52 AM  GAD 7 : Generalized Anxiety Score  Nervous, Anxious, on Edge 1 0 1 0  Control/stop worrying 1 0 0 0  Worry too much - different things 1 2 1  0  Trouble relaxing 0 2 0 0  Restless 0 0 0 0  Easily annoyed or irritable 0 0 2 1  Afraid - awful might happen 0 0 0 0  Total GAD 7 Score 3 4 4 1   Anxiety Difficulty  Not difficult at all      Assessment and Plan:  Pregnancy: G3P1011 at [redacted]w[redacted]d 1. Supervision of high risk pregnancy, antepartum (Primary) --Anticipatory guidance about next visits/weeks of pregnancy given.   - Strep Gp B Culture+Rflx - GC/Chlamydia probe amp ([place])not at [facility]  2. [redacted] weeks gestation of pregnancy  - Strep Gp B Culture+Rflx - GC/Chlamydia probe amp ([place])not at [facility]  3. Chronic hypertension --Pt with symptoms, headache and vision changes.  Consult Dr Ilean. Pt to MAU for PEC evaluation. --IOL scheduled at 37 weeks, if no evidence of severe features today. --  Start labetalol  if pt discharged from MAU today  - labetalol  (NORMODYNE ) 200 MG tablet; Take 1 tablet (200 mg total) by mouth 2 (two) times daily.  Dispense: 60 tablet; Refill: 1  4. Headache in pregnancy, third trimester    Preterm labor symptoms and general obstetric precautions including but not limited to vaginal bleeding, contractions, leaking of fluid and fetal movement were reviewed in detail with the patient. Please refer to After Visit Summary for other counseling recommendations.   No follow-ups on file.  Future Appointments  Date Time Provider Department Center  03/11/2024  6:30 AM [facility] SCHED ROOM [facility] None  03/18/2024  7:00 AM [facility] SCHED ROOM [facility] None   04/21/2024  9:15 AM Dunn, Rollo DASEN, MD [facility] None    Olam Boards, CNM

## 2024-03-10 NOTE — Progress Notes (Signed)
 Pt presents for ROB. Pt c/o slight headache yesterday but no reports of headache today.

## 2024-03-10 NOTE — H&P (Cosign Needed)
 OBSTETRIC ADMISSION HISTORY AND PHYSICAL  Bailey Carney is a 33 y.o. female G3P1011 with IUP at [redacted]w[redacted]d by ultrasound presenting for Pre w/SF. She reports +FMs, No LOF, no VB, no blurry vision, or peripheral edema. Endorses headache, and RUQ .  She plans on breast/bottle feeding. She request Nexplanon for birth control. She received her prenatal care at [facility]   Dating: By ultrasound --->  Estimated Date of Delivery: 04/05/24  Sono:   @[redacted]w[redacted]d , CWD, normal anatomy, cephalic presentation, 1946g, 67% EFW   Prenatal History/Complications: CHTN, Asthma, High BMI          NURSING  PROVIDER  Office Location [facility] Dating by U/S at [redacted]w[redacted]d wks  [facility] Model Traditional Anatomy U/S    Initiated care at  8wks                 Language  English               LAB RESULTS   Support Person  SELF/THE LORD/MOTHER Genetics NIPS: LR XY AFP: Negative      NT/IT (FT only)        Carrier Screen Horizon: Negative 4/4  Rhogam  B/Positive/-- (06/10 1600) A1C/GTT Early HgbA1C: 5.5% Third trimester 2 hr GTT: nl 2 hour  Flu Vaccine  DECLINED 01/15/24      TDaP Vaccine  DECLINED 01/15/24 Blood Type B/Positive/-- (06/10 1600)  RSV Vaccine   Antibody Negative (06/10 1600)  COVID Vaccine   Rubella 1.01 (06/10 1600)  Feeding Plan  BOTH RPR Non Reactive (06/10 1600)  Contraception NEXPLANON HBsAg Negative (06/10 1600)  Circumcision N/a HIV Non Reactive (06/10 1600)  Pediatrician  [facility] HCVAb Non Reactive (06/10 1600)  Prenatal Classes        BTL Consent   Pap       Diagnosis  Date Value Ref Range Status  01/09/2023     Final    - Negative for intraepithelial lesion or malignancy (NILM)    BTL Pre-payment   GC/CT Initial:   36wks:    VBAC Consent   GBS For PCN allergy, check sensitivities   BRx Optimized? [ ]  yes   [ ]  no      DME Rx [ ]  BP cuff [ ]  Weight Scale Waterbirth  [ ]  Class [ ]  Consent [ ]  CNM visit  PHQ9 & GAD7 [  ] new OB [X]  28 weeks  [  ] 36 weeks Induction  [ ]  Orders Entered [ ] Foley Y/N       Past Medical History: Past Medical History:  Diagnosis Date   Asthma    does not use inhaler often   Chlamydia    Cholecystitis    Genital herpes    Hypertension    on meds    Past Surgical History: Past Surgical History:  Procedure Laterality Date   NO PAST SURGERIES      Obstetrical History: OB History     Gravida  3   Para  1   Term  1   Preterm  0   AB  1   Living  1      SAB  1   IAB  0   Ectopic  0   Multiple  0   Live Births  1           Social History Social History   Socioeconomic History   Marital status: Single    Spouse name: Not on file   Number of children: Not on  file   Years of education: Not on file   Highest education level: Not on file  Occupational History   Not on file  Tobacco Use   Smoking status: Never   Smokeless tobacco: Never  Vaping Use   Vaping status: Never Used  Substance and Sexual Activity   Alcohol use: Not Currently    Comment: socially   Drug use: No   Sexual activity: Yes    Birth control/protection: None  Other Topics Concern   Not on file  Social History Narrative   Lives with sister    Social Drivers of Health   Financial Resource Strain: Low Risk  (11/25/2017)   Overall Financial Resource Strain (CARDIA)    Difficulty of Paying Living Expenses: Not hard at all  Food Insecurity: No Food Insecurity (02/29/2024)   Hunger Vital Sign    Worried About Running Out of Food in the Last Year: Never true    Ran Out of Food in the Last Year: Never true  Transportation Needs: No Transportation Needs (02/29/2024)   PRAPARE - Administrator, Civil Service (Medical): No    Lack of Transportation (Non-Medical): No  Physical Activity: Unknown (11/25/2017)   Exercise Vital Sign    Days of Exercise per Week: Patient declined    Minutes of Exercise per Session: Patient declined  Stress: Not on file  Social Connections: Unknown (11/25/2017)   Social Connection and Isolation Panel     Frequency of Communication with Friends and Family: Patient declined    Frequency of Social Gatherings with Friends and Family: Patient declined    Attends Religious Services: Patient declined    Database Administrator or Organizations: Patient declined    Attends Engineer, Structural: Patient declined    Marital Status: Patient declined    Family History: Family History  Problem Relation Age of Onset   Hypertension Mother    Breast cancer Maternal Grandmother    Hypertension Maternal Grandmother    Cancer Maternal Grandmother    Dementia Maternal Grandmother    Cancer Maternal Grandfather    Breast cancer Paternal Grandmother    Cancer Paternal Grandmother     Allergies: Allergies  Allergen Reactions   Amoxicillin Rash and Other (See Comments)    Has patient had a PCN reaction causing immediate rash, facial/tongue/throat swelling, SOB or lightheadedness with hypotension: No Has patient had a PCN reaction causing severe rash involving mucus membranes or skin necrosis: No Has patient had a PCN reaction that required hospitalization No Has patient had a PCN reaction occurring within the last 10 years: No If all of the above answers are NO, then may proceed with Cephalosporin use.     Medications Prior to Admission  Medication Sig Dispense Refill Last Dose/Taking   albuterol  (PROVENTIL  HFA;VENTOLIN  HFA) 108 (90 Base) MCG/ACT inhaler Inhale 1-2 puffs into the lungs every 6 (six) hours as needed for wheezing or shortness of breath. 1 Inhaler 0 Past Month   amLODipine  (NORVASC ) 10 MG tablet Take 1 tablet (10 mg total) by mouth daily. 30 tablet 6 03/10/2024   aspirin  EC 81 MG tablet Take 2 tablets (162 mg total) by mouth daily. Start taking when you are [redacted] weeks pregnant for rest of pregnancy for prevention of preeclampsia 300 tablet 2 03/10/2024   labetalol  (NORMODYNE ) 200 MG tablet Take 1 tablet (200 mg total) by mouth 2 (two) times daily. 60 tablet 1 03/10/2024    Prenatal Vit-Fe Fumarate-FA (PRENATAL VITAMIN PO) Take 1 tablet  by mouth daily. 2 gummies   03/10/2024     Review of Systems   All systems reviewed and negative except as stated in HPI  Blood pressure 137/88, pulse 72, temperature 98.2 F (36.8 C), resp. rate 18, height 5' 7 (1.702 m), weight 121.6 kg, last menstrual period 06/22/2023, SpO2 99%. General appearance: alert and cooperative Lungs: clear to auscultation bilaterally Heart: regular rate and rhythm Abdomen: soft, non-tender; bowel sounds normal Extremities: Homans sign is negative, no sign of DVT DTR's normal Presentation: cephalic Fetal monitoring Baseline: 140 bpm, Variability: Good {> 6 bpm), Accelerations: Reactive, and Decelerations: Absent Uterine activity Frequency: irregular      Prenatal labs: ABO, Rh: B/Positive/-- (06/10 1600) Antibody: Negative (06/10 1600) Rubella: 1.01 (06/10 1600) RPR: Non Reactive (10/01 0835)  HBsAg: Negative (06/10 1600)  HIV: Non Reactive (10/01 0835)  GBS:      Lab Results  Component Value Date   GBS Negative 03/28/2018   GTT normal Genetic screening  negative Anatomy US : normal EIF  Immunization History  Administered Date(s) Administered   Influenza,inj,Quad PF,6+ Mos 01/01/2018   Tdap 05/31/2015, 01/29/2018    Prenatal Transfer Tool  Maternal Diabetes: No Genetic Screening: Normal Maternal Ultrasounds/Referrals: Isolated EIF (echogenic intracardiac focus) Fetal Ultrasounds or other Referrals:  Referred to Materal Fetal Medicine  Maternal Substance Abuse:  No Significant Maternal Medications:  Meds include: Other:  Significant Maternal Lab Results: Group B Strep negative Number of Prenatal Visits:greater than 3 verified prenatal visits Maternal Vaccinations: none Other Comments:  None   Results for orders placed or performed during the hospital encounter of 03/10/24 (from the past 24 hours)  Protein / creatinine ratio, urine   Collection Time: 03/10/24  4:42 PM   Result Value Ref Range   Creatinine, Urine 256 mg/dL   Total Protein, Urine 23 mg/dL   Protein Creatinine Ratio 0.09 0.00 - 0.15 mg/mg[Cre]  Comprehensive metabolic panel   Collection Time: 03/10/24  5:26 PM  Result Value Ref Range   Sodium 137 135 - 145 mmol/L   Potassium 3.5 3.5 - 5.1 mmol/L   Chloride 104 98 - 111 mmol/L   CO2 22 22 - 32 mmol/L   Glucose, Bld 83 70 - 99 mg/dL   BUN 11 6 - 20 mg/dL   Creatinine, Ser 9.18 0.44 - 1.00 mg/dL   Calcium 8.8 (L) 8.9 - 10.3 mg/dL   Total Protein 5.6 (L) 6.5 - 8.1 g/dL   Albumin 2.5 (L) 3.5 - 5.0 g/dL   AST 20 15 - 41 U/L   ALT 17 0 - 44 U/L   Alkaline Phosphatase 89 38 - 126 U/L   Total Bilirubin 0.8 0.0 - 1.2 mg/dL   GFR, Estimated >39 >39 mL/min   Anion gap 11 5 - 15  CBC   Collection Time: 03/10/24  5:26 PM  Result Value Ref Range   WBC 10.1 4.0 - 10.5 K/uL   RBC 3.80 (L) 3.87 - 5.11 MIL/uL   Hemoglobin 11.1 (L) 12.0 - 15.0 g/dL   HCT 66.9 (L) 63.9 - 53.9 %   MCV 86.8 80.0 - 100.0 fL   MCH 29.2 26.0 - 34.0 pg   MCHC 33.6 30.0 - 36.0 g/dL   RDW 87.0 88.4 - 84.4 %   Platelets 144 (L) 150 - 400 K/uL   nRBC 0.0 0.0 - 0.2 %    Patient Active Problem List   Diagnosis Date Noted   Obesity affecting pregnancy, antepartum 01/08/2024   Chronic hypertension 09/24/2023  Supervision of other normal pregnancy, antepartum 08/22/2023   Hirsutism 01/10/2023   Asthma 05/21/2019   Hypertension 05/21/2019   History of postpartum depression 04/29/2019    Assessment/Plan:  BRENIYAH ROMM is a 33 y.o. G3P1011 at [redacted]w[redacted]d here for preeclampsia w/ SF  #Labor:Foley bulb placed with dual Cytotec .  #Pain: IV pain meds ordered, may have epidural as needed #FWB: Catagory 1 #GBS status: unknown, will treat with ancef   #Feeding: Breastmilk  and Formula #Reproductive Life planning: Nexplanon  #Circ:  not applicable  Rosina Hamilton, Student-MidWife  03/10/2024, 9:02 PM    Midwife Attestation:  I personally saw and evaluated the patient,  performing the key elements of the service. I developed and verified the management plan that is described in the resident's/student's note, and I agree with the content with my edits above. VSS, HRR&R, Resp unlabored, Legs neg.    Olam Boards, CNM 9:43 AM

## 2024-03-10 NOTE — MAU Provider Note (Signed)
 History     CSN: [id number]  Arrival date and time: 03/10/24 1637   Event Date/Time   First Provider Initiated Contact with Patient 03/10/24 1702      Chief Complaint  Patient presents with   Headache   Blurred Vision   Hypertension   HPI Bailey Carney is 33 y.o. [phone] [redacted]w[redacted]d here with complaints of headache with blurred vision. Seen in the office today and had mild range blood pressures.   This is the patients 3rd visit for similar complaints of HA  11/21-- Received Tylenol , Flexeril , Decadron , Compazine  and bendaryl with IVF -- HA resolved in MAU due to sleeping. Declined CT at the time.  She reports going home, sleeping and then HA was present when she woke up.   The HA is daily and has been daily for 2-3 weeks. She reports increasing HA intensity and frequency since 26-28 weeks with worsening over the last 2-3 weeks. She has tried increased fluids, tylenol , flexeril  without improvement. She was seen at the office and sent here due to her symptoms.   Reports more blurred vision on the right. She reports seeing floaters and squiggles in both eye but not all time. These vision changes are not persistent and not related to positional changes.   She was 1.5cm dilated  in the office.   +FM, denies LOF, VB, contractions, vaginal discharge.   OB History     Gravida  3   Para  1   Term  1   Preterm  0   AB  1   Living  1      SAB  1   IAB  0   Ectopic  0   Multiple  0   Live Births  1           Past Medical History:  Diagnosis Date   Asthma    does not use inhaler often   Chlamydia    Cholecystitis    Genital herpes    Hypertension    on meds    Past Surgical History:  Procedure Laterality Date   NO PAST SURGERIES      Family History  Problem Relation Age of Onset   Hypertension Mother    Breast cancer Maternal Grandmother    Hypertension Maternal Grandmother    Cancer Maternal Grandmother    Dementia Maternal Grandmother    Cancer  Maternal Grandfather    Breast cancer Paternal Grandmother    Cancer Paternal Grandmother     Social History   Tobacco Use   Smoking status: Never   Smokeless tobacco: Never  Vaping Use   Vaping status: Never Used  Substance Use Topics   Alcohol use: Not Currently    Comment: socially   Drug use: No    Allergies:  Allergies  Allergen Reactions   Amoxicillin Rash and Other (See Comments)    Has patient had a PCN reaction causing immediate rash, facial/tongue/throat swelling, SOB or lightheadedness with hypotension: No Has patient had a PCN reaction causing severe rash involving mucus membranes or skin necrosis: No Has patient had a PCN reaction that required hospitalization No Has patient had a PCN reaction occurring within the last 10 years: No If all of the above answers are NO, then may proceed with Cephalosporin use.     Medications Prior to Admission  Medication Sig Dispense Refill Last Dose/Taking   amLODipine  (NORVASC ) 10 MG tablet Take 1 tablet (10 mg total) by mouth daily. 30 tablet 6 03/10/2024  labetalol  (NORMODYNE ) 200 MG tablet Take 1 tablet (200 mg total) by mouth 2 (two) times daily. 60 tablet 1 03/10/2024   Prenatal Vit-Fe Fumarate-FA (PRENATAL VITAMIN PO) Take 1 tablet by mouth daily. 2 gummies   03/10/2024   albuterol  (PROVENTIL  HFA;VENTOLIN  HFA) 108 (90 Base) MCG/ACT inhaler Inhale 1-2 puffs into the lungs every 6 (six) hours as needed for wheezing or shortness of breath. 1 Inhaler 0    aspirin  EC 81 MG tablet Take 2 tablets (162 mg total) by mouth daily. Start taking when you are [redacted] weeks pregnant for rest of pregnancy for prevention of preeclampsia 300 tablet 2     Review of Systems Physical Exam   Blood pressure 134/88, pulse 99, temperature 98.2 F (36.8 C), resp. rate 18, height 5' 7 (1.702 m), weight 121.6 kg, last menstrual period 06/22/2023.  Physical Exam Vitals and nursing note reviewed.  Constitutional:      General: She is not in acute  distress.    Appearance: She is well-developed.     Comments: Pregnant female  HENT:     Head: Normocephalic and atraumatic.  Eyes:     General: No scleral icterus.    Conjunctiva/sclera: Conjunctivae normal.  Cardiovascular:     Rate and Rhythm: Normal rate.  Pulmonary:     Effort: Pulmonary effort is normal.  Chest:     Chest wall: No tenderness.  Abdominal:     Palpations: Abdomen is soft.     Tenderness: There is no abdominal tenderness. There is no guarding or rebound.     Comments: Gravid  Genitourinary:    Vagina: Normal.  Musculoskeletal:        General: Normal range of motion.     Cervical back: Normal range of motion and neck supple.  Skin:    General: Skin is warm and dry.     Findings: No rash.  Neurological:     Mental Status: She is alert and oriented to person, place, and time.     MAU Course  Procedures   Assessment and Plan  ADMIT TO LD Plan for magnesium  4g/2g GBS - Ancepf due to her PCN allergy  Bailey Carney 03/10/2024, 5:02 PM

## 2024-03-10 NOTE — MAU Note (Signed)
 Bailey Carney is a 33 y.o. at [redacted]w[redacted]d here in MAU reporting: sent from office for headache and blurred vision.  Has not taken anything for headache because nothing helps.   Good fetal movement felt. Reports occasion ctx nothing consistent. No bleeding or leaking.   LMP:  Onset of complaint: continuing  Pain score: 8 Vitals:   03/10/24 1644  BP: 134/88  Pulse: 99  Resp: 18  Temp: 98.2 F (36.8 C)     FHT: 143  Lab orders placed from triage: [facility]

## 2024-03-10 NOTE — MAU Note (Signed)

## 2024-03-11 ENCOUNTER — Encounter: Admitting: Obstetrics

## 2024-03-11 ENCOUNTER — Inpatient Hospital Stay (HOSPITAL_COMMUNITY): Admitting: Anesthesiology

## 2024-03-11 ENCOUNTER — Inpatient Hospital Stay (HOSPITAL_COMMUNITY)

## 2024-03-11 ENCOUNTER — Inpatient Hospital Stay (HOSPITAL_COMMUNITY): Admission: AD | Admit: 2024-03-11 | Source: Home / Self Care

## 2024-03-11 DIAGNOSIS — Z3A36 36 weeks gestation of pregnancy: Secondary | ICD-10-CM

## 2024-03-11 DIAGNOSIS — O1414 Severe pre-eclampsia complicating childbirth: Secondary | ICD-10-CM

## 2024-03-11 LAB — COMPREHENSIVE METABOLIC PANEL WITH GFR
ALT: 19 U/L (ref 0–44)
AST: 22 U/L (ref 15–41)
Albumin: 2.6 g/dL — ABNORMAL LOW (ref 3.5–5.0)
Alkaline Phosphatase: 94 U/L (ref 38–126)
Anion gap: 10 (ref 5–15)
BUN: 9 mg/dL (ref 6–20)
CO2: 20 mmol/L — ABNORMAL LOW (ref 22–32)
Calcium: 8.3 mg/dL — ABNORMAL LOW (ref 8.9–10.3)
Chloride: 105 mmol/L (ref 98–111)
Creatinine, Ser: 0.77 mg/dL (ref 0.44–1.00)
GFR, Estimated: >60 mL/min (ref >60)
Glucose, Bld: 87 mg/dL (ref 70–99)
Potassium: 3.8 mmol/L (ref 3.5–5.1)
Sodium: 135 mmol/L (ref 135–145)
Total Bilirubin: 0.6 mg/dL (ref 0.0–1.2)
Total Protein: 5.8 g/dL — ABNORMAL LOW (ref 6.5–8.1)

## 2024-03-11 LAB — GC/CHLAMYDIA PROBE AMP (~~LOC~~) NOT AT ARMC
Chlamydia: NEGATIVE
Comment: NEGATIVE
Comment: NORMAL
Neisseria Gonorrhea: NEGATIVE

## 2024-03-11 LAB — CBC
HCT: 34.5 % — ABNORMAL LOW (ref 36.0–46.0)
Hemoglobin: 11.9 g/dL — ABNORMAL LOW (ref 12.0–15.0)
MCH: 29.5 pg (ref 26.0–34.0)
MCHC: 34.5 g/dL (ref 30.0–36.0)
MCV: 85.6 fL (ref 80.0–100.0)
Platelets: 151 K/uL (ref 150–400)
RBC: 4.03 MIL/uL (ref 3.87–5.11)
RDW: 12.6 % (ref 11.5–15.5)
WBC: 12.4 K/uL — ABNORMAL HIGH (ref 4.0–10.5)
nRBC: 0 % (ref 0.0–0.2)

## 2024-03-11 LAB — MAGNESIUM
Magnesium: 4.1 mg/dL — ABNORMAL HIGH (ref 1.7–2.4)
Magnesium: 4.3 mg/dL — ABNORMAL HIGH (ref 1.7–2.4)

## 2024-03-11 LAB — PROTEIN / CREATININE RATIO, URINE
Creatinine, Urine: 76 mg/dL
Protein Creatinine Ratio: 0.2 mg/mg{creat} — ABNORMAL HIGH (ref 0.00–0.15)
Total Protein, Urine: 15 mg/dL

## 2024-03-11 LAB — SYPHILIS: RPR W/REFLEX TO RPR TITER AND TREPONEMAL ANTIBODIES, TRADITIONAL SCREENING AND DIAGNOSIS ALGORITHM: RPR Ser Ql: NONREACTIVE

## 2024-03-11 LAB — GLUCOSE, CAPILLARY: Glucose-Capillary: 88 mg/dL (ref 70–99)

## 2024-03-11 MED ORDER — COCONUT OIL OIL
1.0000 | TOPICAL_OIL | Status: DC | PRN
  Administered 2024-03-11: 1 via TOPICAL

## 2024-03-11 MED ORDER — ONDANSETRON HCL 4 MG PO TABS: 4.0000 mg | ORAL_TABLET | ORAL | Status: DC | PRN

## 2024-03-11 MED ORDER — FUROSEMIDE 20 MG PO TABS
20.0000 mg | ORAL_TABLET | Freq: Every day | ORAL | Status: DC
  Administered 2024-03-11 – 2024-03-13 (×3): 20 mg via ORAL
  Filled 2024-03-11 (×3): qty 1

## 2024-03-11 MED ORDER — LIDOCAINE HCL (PF) 1 % IJ SOLN
INTRAMUSCULAR | Status: DC | PRN
  Administered 2024-03-11: 11 mL via EPIDURAL

## 2024-03-11 MED ORDER — SIMETHICONE 80 MG PO CHEW: 80.0000 mg | CHEWABLE_TABLET | ORAL | Status: DC | PRN

## 2024-03-11 MED ORDER — IBUPROFEN 600 MG PO TABS
600.0000 mg | ORAL_TABLET | Freq: Four times a day (QID) | ORAL | Status: DC
  Administered 2024-03-11 – 2024-03-13 (×8): 600 mg via ORAL
  Filled 2024-03-11 (×8): qty 1

## 2024-03-11 MED ORDER — WITCH HAZEL-GLYCERIN EX PADS: 1.0000 | MEDICATED_PAD | CUTANEOUS | Status: DC | PRN

## 2024-03-11 MED ORDER — ONDANSETRON HCL 4 MG/2ML IJ SOLN: 4.0000 mg | INTRAMUSCULAR | Status: DC | PRN

## 2024-03-11 MED ORDER — OXYTOCIN-SODIUM CHLORIDE 30-0.9 UT/500ML-% IV SOLN
1.0000 m[IU]/min | INTRAVENOUS | Status: DC
  Administered 2024-03-11: 2 m[IU]/min via INTRAVENOUS

## 2024-03-11 MED ORDER — BENZOCAINE-MENTHOL 20-0.5 % EX AERO
1.0000 | INHALATION_SPRAY | CUTANEOUS | Status: DC | PRN
  Filled 2024-03-11: qty 56

## 2024-03-11 MED ORDER — MAGNESIUM SULFATE 40 GM/1000ML IV SOLN
2.0000 g/h | INTRAVENOUS | Status: AC
  Administered 2024-03-11 (×2): 2 g/h via INTRAVENOUS
  Filled 2024-03-11: qty 1000

## 2024-03-11 MED ORDER — PRENATAL MULTIVITAMIN CH
1.0000 | ORAL_TABLET | Freq: Every day | ORAL | Status: DC
  Administered 2024-03-11 – 2024-03-12 (×2): 1 via ORAL
  Filled 2024-03-11 (×2): qty 1

## 2024-03-11 MED ORDER — AMLODIPINE BESYLATE 5 MG PO TABS
10.0000 mg | ORAL_TABLET | Freq: Every day | ORAL | Status: DC
  Administered 2024-03-11 – 2024-03-13 (×3): 10 mg via ORAL
  Filled 2024-03-11 (×3): qty 2

## 2024-03-11 MED ORDER — DIPHENHYDRAMINE HCL 25 MG PO CAPS: 25.0000 mg | ORAL_CAPSULE | Freq: Four times a day (QID) | ORAL | Status: DC | PRN

## 2024-03-11 MED ORDER — AMMONIA AROMATIC IN INHA
RESPIRATORY_TRACT | Status: AC
  Filled 2024-03-11: qty 10

## 2024-03-11 MED ORDER — LACTATED RINGERS IV SOLN: INTRAVENOUS | Status: AC

## 2024-03-11 MED ORDER — SENNOSIDES-DOCUSATE SODIUM 8.6-50 MG PO TABS
2.0000 | ORAL_TABLET | Freq: Every day | ORAL | Status: DC
  Administered 2024-03-12 – 2024-03-13 (×2): 2 via ORAL
  Filled 2024-03-11 (×2): qty 2

## 2024-03-11 MED ORDER — TETANUS-DIPHTH-ACELL PERTUSSIS 5-2-15.5 LF-MCG/0.5 IM SUSP: 0.5000 mL | Freq: Once | INTRAMUSCULAR | Status: DC

## 2024-03-11 MED ORDER — DIBUCAINE (PERIANAL) 1 % EX OINT: 1.0000 | TOPICAL_OINTMENT | CUTANEOUS | Status: DC | PRN

## 2024-03-11 MED ORDER — TERBUTALINE SULFATE 1 MG/ML IJ SOLN: 0.2500 mg | Freq: Once | INTRAMUSCULAR | Status: DC | PRN

## 2024-03-11 MED ORDER — ACETAMINOPHEN 325 MG PO TABS
650.0000 mg | ORAL_TABLET | ORAL | Status: DC | PRN
  Administered 2024-03-11 – 2024-03-12 (×3): 650 mg via ORAL
  Filled 2024-03-11 (×3): qty 2

## 2024-03-11 MED ORDER — POTASSIUM CHLORIDE CRYS ER 20 MEQ PO TBCR
20.0000 meq | EXTENDED_RELEASE_TABLET | Freq: Every day | ORAL | Status: DC
  Administered 2024-03-11 – 2024-03-13 (×3): 20 meq via ORAL
  Filled 2024-03-11 (×3): qty 1

## 2024-03-11 MED ORDER — ZOLPIDEM TARTRATE 5 MG PO TABS: 5.0000 mg | ORAL_TABLET | Freq: Every evening | ORAL | Status: DC | PRN

## 2024-03-11 NOTE — Progress Notes (Signed)
 Bailey Carney is a 33 y.o. G3P1011 at [redacted]w[redacted]d by ultrasound admitted for induction of labor due to preE w/SF.  Subjective: Called to bedside after epidural placement. Patient was lethargic, but oriented to person, place and time. She describes feeling, just not right and lightheaded.  Her vitals remained stable throughout this time period. Magnesium  turned off and a magnesium  level drawn. Magnesium  level 4.3, with brisk reflexes. Anesthesia called to bedside to assess and found no evidence of epidural involvement with symptoms. Magnesium  turned back on. Nohely regained color, and stated feeling better before leaving bedside.   Objective: BP 122/71   Pulse 62   Temp 97.9 F (36.6 C) (Oral)   Resp 16   Ht 5' 7 (1.702 m)   Wt 121.6 kg   LMP 06/22/2023 (Exact Date)   SpO2 100%   BMI 41.97 kg/m  No intake/output data recorded. Total I/O In: 1923.8 [P.O.:600; I.V.:1222.7; Other:1.2; IV Piggyback:99.9] Out: 325 [Urine:325]  FHT:  FHR: 120 bpm, variability: moderate,  accelerations:  Present,  decelerations:  Absent UC:   regular, every 2-3 minutes SVE:   Dilation: 5.5 Effacement (%): 70 Station: -2 Exam by:: Lacinda Gamer, RN  Labs: Lab Results  Component Value Date   WBC 12.4 (H) 03/11/2024   HGB 11.9 (L) 03/11/2024   HCT 34.5 (L) 03/11/2024   MCV 85.6 03/11/2024   PLT 151 03/11/2024    Assessment / Plan: Induction of labor due to preeclampsia, pitocin  started.  Labor: AROM @ [id number], pitocin  started. Will recheck in four hours.  Preeclampsia:  on magnesium  sulfate, no signs or symptoms of toxicity, and labs stable Fetal Wellbeing:  Category I Pain Control:  Labor support without medications I/D:  GBS unk,  AROM @ 9787 Anticipated MOD:  NSVD  Rosina Hamilton, Student-MidWife 03/11/2024, 5:37 AM

## 2024-03-11 NOTE — Anesthesia Preprocedure Evaluation (Addendum)
 Anesthesia Evaluation  Patient identified by MRN, date of birth, ID band Patient awake    Reviewed: Allergy & Precautions, NPO status , Patient's Chart, lab work & pertinent test results, reviewed documented beta blocker date and time   Airway Mallampati: II  TM Distance: >3 FB Neck ROM: Full    Dental no notable dental hx. (+) Teeth Intact   Pulmonary asthma    Pulmonary exam normal breath sounds clear to auscultation       Cardiovascular hypertension, Pt. on medications and Pt. on home beta blockers Normal cardiovascular exam Rhythm:Regular Rate:Normal     Neuro/Psych negative neurological ROS  negative psych ROS   GI/Hepatic negative GI ROS, Neg liver ROS,,,  Endo/Other    Class 3 obesity  Renal/GU negative Renal ROS  negative genitourinary   Musculoskeletal negative musculoskeletal ROS (+)    Abdominal  (+) + obese  Peds  Hematology negative hematology ROS (+)   Anesthesia Other Findings   Reproductive/Obstetrics (+) Pregnancy                              Anesthesia Physical Anesthesia Plan  ASA: III  Anesthesia Plan: Epidural   Post-op Pain Management:    Induction:   PONV Risk Score and Plan: Treatment may vary due to age or medical condition  Airway Management Planned: Natural Airway  Additional Equipment:   Intra-op Plan:   Post-operative Plan:   Informed Consent: I have reviewed the patients History and Physical, chart, labs and discussed the procedure including the risks, benefits and alternatives for the proposed anesthesia with the patient or authorized representative who has indicated his/her understanding and acceptance.       Plan Discussed with: Anesthesiologist  Anesthesia Plan Comments: (Patient identified. Risks, benefits, options discussed with patient including but not limited to bleeding, infection, nerve damage, paralysis, failed block,  incomplete pain control, headache, blood pressure changes, nausea, vomiting, reactions to medication, itching, and post partum back pain. Confirmed with bedside nurse the patient's most recent platelet count. Confirmed with the patient that they are not taking any anticoagulation, have any bleeding history or any family history of bleeding disorders. Patient expressed understanding and wishes to proceed. All questions were answered. )         Anesthesia Quick Evaluation

## 2024-03-11 NOTE — Progress Notes (Addendum)
 Bailey Carney is a 33 y.o. G3P1011 at [redacted]w[redacted]d by ultrasound admitted for induction of labor due to preE w/SF.  Subjective: Bailey Carney is doing well overall, she is feeling cramping after Cytotec .   Objective: BP (!) 101/59   Pulse 75   Temp 97.9 F (36.6 C) (Oral)   Resp 16   Ht 5' 7 (1.702 m)   Wt 121.6 kg   LMP 06/22/2023 (Exact Date)   SpO2 99%   BMI 41.97 kg/m  No intake/output data recorded. Total I/O In: 807.7 [P.O.:360; I.V.:347.8; IV Piggyback:99.9] Out: 325 [Urine:325]  FHT:  FHR: 135 bpm, variability: moderate,  accelerations:  Present,  decelerations:  Absent UC:   irregular SVE:   Dilation: 4 Effacement (%): 60 Station: -2 Exam by:: Rosina Scottm CNM - Student HSV: not on valtrex  suppression, exam negative for active lesions.   Labs: Lab Results  Component Value Date   WBC 11.1 (H) 03/10/2024   HGB 12.5 03/10/2024   HCT 36.9 03/10/2024   MCV 86.8 03/10/2024   PLT 147 (L) 03/10/2024    Assessment / Plan: Progressing well; latent phase  Labor: Progressing normally, Foley balloon out. AROM for clear fluid.  Preeclampsia:  on magnesium  sulfate and no signs or symptoms of toxicity Fetal Wellbeing:  Category I Pain Control:  Labor support without medications I/D:  GBS unknown, AROM @ 9787 clear Anticipated MOD:  NSVB  Rosina Hamilton, Student-MidWife 03/11/2024, 2:18 AM

## 2024-03-11 NOTE — Discharge Summary (Signed)
 Postpartum Discharge Summary     Patient Name: Bailey Carney DOB: 04/06/1991 MRN: [id number]  Date of admission: 03/10/2024 Delivery date:03/11/2024 Delivering provider: MILLY PLANAS A Date of discharge: 03/13/2024  Admitting diagnosis: Preeclampsia, severe [O14.10] Intrauterine pregnancy: [redacted]w[redacted]d     Secondary diagnosis:  Active Problems:   * No active hospital problems. *  Additional problems: none    Discharge diagnosis: Term Pregnancy Delivered                                              Post partum procedures:none Augmentation: AROM, Pitocin , Cytotec , and IP Foley Complications: None  Hospital course: Induction of Labor With Vaginal Delivery   33 y.o. yo [id number] at [redacted]w[redacted]d was admitted to the hospital 03/10/2024 for induction of labor.  Indication for induction: preeclampsia with SF.  Patient had an uncomplicated labor course. Membrane Rupture Time/Date: 2:12 AM,03/11/2024  Delivery Method:Vaginal, Spontaneous Operative Delivery:N/A Episiotomy: None Lacerations:  None Details of delivery can be found in separate delivery note.  Patient had a postpartum course complicated by chronic hypertension with superimposed preeclampsia. She received magnesium  for 24 hours postpartum. Her blood pressure was well controlled on amlodipine  10 mg daily and lasix /k. Patient is discharged home 03/13/24.  Newborn Data: Birth date:03/11/2024 Birth time:8:04 AM Gender:Female Living status:Living Apgars:8 ,9  Weight:2670 g  Magnesium  Sulfate received: Yes: Seizure prophylaxis BMZ received: No Rhophylac:No MMR:No T-DaP:declined Flu: No RSV Vaccine received: No Transfusion:No  Immunizations received: Immunization History  Administered Date(s) Administered   Influenza,inj,Quad PF,6+ Mos 01/01/2018   Tdap 05/31/2015, 01/29/2018    Physical exam  Vitals:   03/12/24 1607 03/12/24 1929 03/12/24 2327 03/13/24 0513  BP: 132/76 133/73 134/76 127/81  Pulse: 69 66 73 60   Resp: 17 18 18 18   Temp: 98.3 F (36.8 C) 98.6 F (37 C) 98.7 F (37.1 C) 98.4 F (36.9 C)  TempSrc: Oral Oral Oral Oral  SpO2: 99% 99% 97% 100%  Weight:      Height:       General: alert, cooperative, and no distress Lochia: appropriate Uterine Fundus: firm Incision: N/A DVT Evaluation: No significant calf/ankle edema. Labs: Lab Results  Component Value Date   WBC 12.4 (H) 03/11/2024   HGB 11.9 (L) 03/11/2024   HCT 34.5 (L) 03/11/2024   MCV 85.6 03/11/2024   PLT 151 03/11/2024      Latest Ref Rng & Units 03/11/2024    2:59 AM  CMP  Glucose 70 - 99 mg/dL 87   BUN 6 - 20 mg/dL 9   Creatinine 9.55 - 8.99 mg/dL 9.22   Sodium 864 - 854 mmol/L 135   Potassium 3.5 - 5.1 mmol/L 3.8   Chloride 98 - 111 mmol/L 105   CO2 22 - 32 mmol/L 20   Calcium 8.9 - 10.3 mg/dL 8.3   Total Protein 6.5 - 8.1 g/dL 5.8   Total Bilirubin 0.0 - 1.2 mg/dL 0.6   Alkaline Phos 38 - 126 U/L 94   AST 15 - 41 U/L 22   ALT 0 - 44 U/L 19    Edinburgh Score:    03/11/2024    4:22 PM  Edinburgh Postnatal Depression Scale Screening Tool  I have been able to laugh and see the funny side of things. 0  I have looked forward with enjoyment to things. 0  I have blamed myself  unnecessarily when things went wrong. 0  I have been anxious or worried for no good reason. 1  I have felt scared or panicky for no good reason. 1  Things have been getting on top of me. 1  I have been so unhappy that I have had difficulty sleeping. 0  I have felt sad or miserable. 0  I have been so unhappy that I have been crying. 0  The thought of harming myself has occurred to me. 0  Edinburgh Postnatal Depression Scale Total 3   No data recorded  After visit meds:  Allergies as of 03/13/2024       Reactions   Amoxicillin Rash, Other (See Comments)   Has patient had a PCN reaction causing immediate rash, facial/tongue/throat swelling, SOB or lightheadedness with hypotension: No Has patient had a PCN reaction causing  severe rash involving mucus membranes or skin necrosis: No Has patient had a PCN reaction that required hospitalization No Has patient had a PCN reaction occurring within the last 10 years: No If all of the above answers are NO, then may proceed with Cephalosporin use.        Medication List     STOP taking these medications    aspirin  EC 81 MG tablet   labetalol  200 MG tablet Commonly known as: NORMODYNE        TAKE these medications    acetaminophen  325 MG tablet Commonly known as: Tylenol  Take 2 tablets (650 mg total) by mouth every 4 (four) hours as needed (for pain scale < 4).   albuterol  108 (90 Base) MCG/ACT inhaler Commonly known as: VENTOLIN  HFA Inhale 1-2 puffs into the lungs every 6 (six) hours as needed for wheezing or shortness of breath.   amLODipine  10 MG tablet Commonly known as: Norvasc  Take 1 tablet (10 mg total) by mouth daily.   furosemide  20 MG tablet Commonly known as: LASIX  Take 1 tablet (20 mg total) by mouth daily.   ibuprofen  600 MG tablet Commonly known as: ADVIL  Take 1 tablet (600 mg total) by mouth every 6 (six) hours.   potassium chloride  SA 20 MEQ tablet Commonly known as: KLOR-CON  M Take 1 tablet (20 mEq total) by mouth daily.   PRENATAL VITAMIN PO Take 1 tablet by mouth daily. 2 gummies         Discharge home in stable condition Infant Feeding: Bottle and Breast Infant Disposition:home with mother Discharge instruction: per After Visit Summary and Postpartum booklet. Activity: Advance as tolerated. Pelvic rest for 6 weeks.  Diet: routine diet Future Appointments: Future Appointments  Date Time Provider Department Center  03/18/2024 10:20 AM [facility] NURSE [facility] None  04/21/2024  9:15 AM Leane Loring, Rollo DASEN, MD [facility] None   Follow up Visit:  Follow-up Information     [facility] at [facility] Follow up in 1 week(s).   Specialty: Obstetrics and Gynecology Why: Blood pressure check Contact  information: [address] [place]  [address] [phone]        [facility] for [facility] at [facility] Follow up in 6 week(s).   Specialty: Obstetrics and Gynecology Why: Postpartum visit Contact information: [address], [address] [place]  [address] [phone]                Message sent to [facility] on 03/11/24:  Please schedule this patient for a In person postpartum visit in 6 weeks with the following provider: Olam Boards, if possible. Additional  Postpartum F/U:BP check 1 week  High risk pregnancy complicated by: CHTN, preeclampsia Delivery mode:  Vaginal, Spontaneous Anticipated Birth Control:  outpatient Nexplanon   03/13/2024 Rollo ONEIDA Bring, MD

## 2024-03-11 NOTE — Anesthesia Postprocedure Evaluation (Signed)
 Anesthesia Post Note  Patient: Bailey Carney  Procedure(s) Performed: AN AD HOC LABOR EPIDURAL     Patient location during evaluation: Mother Baby Anesthesia Type: Epidural Level of consciousness: awake, awake and alert and oriented Pain management: satisfactory to patient Vital Signs Assessment: post-procedure vital signs reviewed and stable Respiratory status: spontaneous breathing, nonlabored ventilation and respiratory function stable Cardiovascular status: blood pressure returned to baseline and stable Postop Assessment: no headache, no backache, no apparent nausea or vomiting, patient able to bend at knees, adequate PO intake and able to ambulate Anesthetic complications: no   No notable events documented.  Last Vitals:  Vitals:   03/11/24 1730 03/11/24 1830  BP:    Pulse:    Resp: 16 16  Temp:    SpO2:      Last Pain:  Vitals:   03/11/24 1730  TempSrc:   PainSc: 0-No pain   Pain Goal:                   [name]

## 2024-03-11 NOTE — Lactation Note (Signed)
 This note was copied from a baby's chart. Lactation Consultation Note  Patient Name: Bailey Carney Date: 03/11/2024 Age:31 hours Reason for consult: Initial assessment;NICU baby;Late-preterm 34-36.6wks;Infant < 6lbs;Other (Comment);Exclusive pumping and bottle feeding;1st time breastfeeding (cHTN)  Visited with family of 28 54/21 weeks old female [place]; [place] RN Bailey Carney asked to see Bailey Carney; she is a P2 but this is her first time breastfeeding; her plan is to exclusively pump and bottle feed. Assisted with breast massage and hand expression, but she asked to hold on to pumping until baby's next feeding time because her visitors arrived. This LC set up a DEBP and showed her how to use it. Instructed her to discard colostrum containers after 24 hours.  Reviewed pumping normal LPI behavior, size of baby's stomach, schedule, pumping log, feeding cues, secretory activation, lactogenesis III, CDC, supplementation and anticipatory guidelines. Unable to do the fitting of a pumpign band in size XL due to IV running. Baby is already on Similac 22 calorie formula and taking full volumes @ 6 hours post-partum.   Maternal Data Has patient been taught Hand Expression?: Yes Does the patient have breastfeeding experience prior to this delivery?: No  Feeding Mother's Current Feeding Choice: Breast Milk and Formula Nipple Type: Extra Slow Flow  Lactation Tools Discussed/Used Tools: Pump;Flanges;Coconut oil Flange Size: 21 Breast pump type: Double-Electric Breast Pump Pump Education: Setup, frequency, and cleaning;Milk Storage Reason for Pumping: LPI < 6 Lbs Pumping frequency: pump was set up at 6 hours post-partum  Interventions Interventions: Breast feeding basics reviewed;Coconut oil;DEBP;Education;LC Services brochure;LPT handout/interventions  Plan STS whenever possible Breast massage, hand expression and coconut oil prior to pumping Pump both breast on initiate mode every 3 hours for  15 minutes, ideally 8 pumping sessions/24 hours Continue supplementing baby with Similac 22 calorie formula; will follow supplementation guidelines per baby's age in hours  Visitors and big brother present. All questions and concerns answered, family to contact [facility] services PRN.  Discharge Discharge Education: Engorgement and breast care Pump: Hands Free;Personal (Wireless pump through insurance)  Consult Status Consult Status: Follow-up Date: 03/12/24 Follow-up type: In-patient   Bailey Carney 03/11/2024, 3:18 PM

## 2024-03-11 NOTE — Anesthesia Procedure Notes (Signed)
 Epidural Patient location during procedure: OB Start time: 03/11/2024 3:44 AM End time: 03/11/2024 4:06 AM  Staffing Anesthesiologist: Cleotilde Butler Dade, MD Performed: anesthesiologist   Preanesthetic Checklist Completed: patient identified, IV checked, site marked, risks and benefits discussed, surgical consent, monitors and equipment checked, pre-op evaluation and timeout performed  Epidural Patient position: sitting Prep: ChloraPrep Patient monitoring: heart rate, cardiac monitor, continuous pulse ox and blood pressure Approach: midline Location: L2-L3 Injection technique: LOR saline  Needle:  Needle type: Tuohy  Needle gauge: 17 G Needle length: 9 cm Needle insertion depth: 8 cm Catheter type: closed end flexible Catheter size: 20 Guage Catheter at skin depth: 13 cm Test dose: negative  Assessment Events: blood not aspirated, injection not painful, no injection resistance, no paresthesia and negative IV test  Additional Notes Reason for block:procedure for pain

## 2024-03-12 ENCOUNTER — Encounter (HOSPITAL_COMMUNITY): Payer: Self-pay | Admitting: Family Medicine

## 2024-03-12 NOTE — Lactation Note (Signed)
 This note was copied from a baby's chart. Lactation Consultation Note  Patient Name: Bailey Carney Date: 03/12/2024 Age:31 hours Reason for consult: Follow-up assessment;Late-preterm 34-36.6wks.  LPTI, Per MOB, she is pumping every 3 hours for 15 minutes, she is starting to see drop of colostrum. LC discussed placing drops of colostrum on infant's lips and gums and to continue to pump and give infant back her EBM. MOB knows that her EBM is safe for 4 hours whereas formula RTF once open is only safe for 1 hour. MOB aware infant's intake has increase and she is working towards infant increasing her intake to 18- 21 mls per feeding on day 2 of life. MOB is pace feeding infant and burping infant with feedings.   Maternal Data    Feeding Mother's Current Feeding Choice: Breast Milk and Formula Nipple Type: Extra Slow Flow  LATCH Score                    Lactation Tools Discussed/Used    Interventions Interventions: Education;DEBP  Discharge    Consult Status      Bailey Carney 03/12/2024, 12:19 PM

## 2024-03-12 NOTE — Progress Notes (Signed)
 Post Partum Day 1 Subjective: no complaints, up ad lib, voiding, and tolerating PO  Objective: Blood pressure 137/84, pulse 71, temperature 98.2 F (36.8 C), temperature source Oral, resp. rate 16, height 5' 7 (1.702 m), weight 121.6 kg, last menstrual period 06/22/2023, SpO2 99%, unknown if currently breastfeeding.  Physical Exam:  General: alert, cooperative, and no distress Lochia: appropriate Uterine Fundus: firm Incision:  DVT Evaluation: No evidence of DVT seen on physical exam.  Recent Labs    03/10/24 2049 03/11/24 0259  HGB 12.5 11.9*  HCT 36.9 34.5*    Assessment/Plan: Plan for discharge tomorrow Magnesium  off this am Amlodipine  + lasix  + K+   LOS: 2 days   Bailey VEAR Inch, MD 03/12/2024, 9:03 AM

## 2024-03-13 ENCOUNTER — Other Ambulatory Visit (HOSPITAL_COMMUNITY): Payer: Self-pay

## 2024-03-13 MED ORDER — ACETAMINOPHEN 325 MG PO TABS
650.0000 mg | ORAL_TABLET | ORAL | 0 refills | Status: AC | PRN
  Filled 2024-03-13: qty 60, 5d supply, fill #0

## 2024-03-13 MED ORDER — POTASSIUM CHLORIDE CRYS ER 20 MEQ PO TBCR
20.0000 meq | EXTENDED_RELEASE_TABLET | Freq: Every day | ORAL | 0 refills | Status: AC
  Filled 2024-03-13: qty 5, 5d supply, fill #0

## 2024-03-13 MED ORDER — FUROSEMIDE 20 MG PO TABS
20.0000 mg | ORAL_TABLET | Freq: Every day | ORAL | 0 refills | Status: AC
  Filled 2024-03-13: qty 5, 5d supply, fill #0

## 2024-03-13 MED ORDER — AMLODIPINE BESYLATE 10 MG PO TABS
10.0000 mg | ORAL_TABLET | Freq: Every day | ORAL | 6 refills | Status: AC
  Filled 2024-03-13: qty 30, 30d supply, fill #0

## 2024-03-13 MED ORDER — IBUPROFEN 600 MG PO TABS
600.0000 mg | ORAL_TABLET | Freq: Four times a day (QID) | ORAL | 0 refills | Status: AC
  Filled 2024-03-13: qty 60, 15d supply, fill #0

## 2024-03-13 NOTE — Lactation Note (Signed)
 This note was copied from a baby's chart. Lactation Consultation Note  Patient Name: Bailey Carney Unijb'd Date: 03/13/2024 Age:31 hours Reason for consult: Maternal discharge;Follow-up assessment;Late-preterm 34-36.6wks P2, MOB feeding choice is exclusively pumping and bottle feeding infant. Per MOB on Day 3 infant is consuming 28 to 30 mls of 22 kcal formula and any EBM that is pumped. MOB informed LC she pumped maybe 4 times yesterday. LC discussed to increase and stimulate her milk supply advised to pump every 3 or 4 hours within a day. Lc reviewed that her EBM is safe for 4 hours at room temperature whereas formula RTF once open is only safe for 1 hour.  MOB informed LC she does have DEBP at home.  Discharged education and plan : Day 3 for LPTI 1- MOB will continue to feed infant every 3 hours and offer ( 24-28) mls per feeding or more if infant wants it. 2- LC discussed engorgement treatment and plan.  3- LC discussed warning signs of dehydration in infant.   Maternal Data    Feeding Mother's Current Feeding Choice: Breast Milk and Formula Nipple Type: Extra Slow Flow  LATCH Score                    Lactation Tools Discussed/Used    Interventions Interventions: DEBP;Education;CDC milk storage guidelines;LC Services brochure;Guidelines for Milk Supply and Pumping Schedule Handout;CDC Guidelines for Breast Pump Cleaning;LPT handout/interventions  Discharge Pump: Personal;Hands Free;DEBP  Consult Status Consult Status: Complete Date: 03/13/24 Follow-up type: Physician    Bailey Carney 03/13/2024, 10:16 AM

## 2024-03-14 LAB — STREP GP B CULTURE+RFLX: Strep Gp B Culture+Rflx: NEGATIVE

## 2024-03-16 ENCOUNTER — Other Ambulatory Visit

## 2024-03-18 ENCOUNTER — Inpatient Hospital Stay (HOSPITAL_COMMUNITY): Admission: RE | Admit: 2024-03-18 | Payer: Self-pay | Source: Home / Self Care | Admitting: Family Medicine

## 2024-03-18 ENCOUNTER — Inpatient Hospital Stay (HOSPITAL_COMMUNITY)

## 2024-03-18 ENCOUNTER — Ambulatory Visit

## 2024-03-18 VITALS — BP 130/89 | HR 85 | Wt 255.9 lb

## 2024-03-18 DIAGNOSIS — Z0130 Encounter for examination of blood pressure without abnormal findings: Secondary | ICD-10-CM

## 2024-03-18 NOTE — Progress Notes (Signed)
 Subjective:  Bailey Carney is a [id number] here for BP check.  She is 1 weeks postpartum following a normal spontaneous vaginal delivery.    Hypertension ROS: Patient has occasional headaches, relieved by Tylenol    Objective:  BP 131/89   Pulse 85   Wt 255 lb 14.4 oz (116.1 kg)   LMP 06/22/2023 (Exact Date)   Breastfeeding Yes   BMI 40.08 kg/m   Appearance alert, well appearing, and in no distress.  Assessment:   Blood Pressure stable.   Plan:  Current treatment plan is effective, no change in therapy. Advised of abnormal symptoms to report and monitor BP at home PP visit is scheduled for 04/21/24   Laymon JONETTA Crigler, RN

## 2024-03-23 ENCOUNTER — Encounter (HOSPITAL_COMMUNITY)

## 2024-03-27 ENCOUNTER — Other Ambulatory Visit

## 2024-03-31 ENCOUNTER — Encounter: Payer: Self-pay | Admitting: Obstetrics and Gynecology

## 2024-03-31 ENCOUNTER — Ambulatory Visit: Admitting: Obstetrics

## 2024-03-31 VITALS — BP 149/81 | HR 71 | Wt 248.0 lb

## 2024-03-31 DIAGNOSIS — O165 Unspecified maternal hypertension, complicating the puerperium: Secondary | ICD-10-CM

## 2024-03-31 MED ORDER — LABETALOL HCL 100 MG PO TABS: 100.0000 mg | ORAL_TABLET | Freq: Two times a day (BID) | ORAL | 0 refills | Status: DC

## 2024-03-31 NOTE — Progress Notes (Signed)
° °  ESTABLISHED GYNECOLOGY VISIT Chief Complaint  Patient presents with   Follow-up    Subjective:  Bailey Carney is a 33 y.o. [phone] presenting for early postpartum evaluation, requesting to be cleared for work next week.  She is s/p NSVD on 03/11/24 after induction for chronic hypertension with superimposed preeclampsia with severe features. She was discharged home on amlodipine  and lasix . She was seen for her postpartum BP check on 12/3 and was normotensive Per review of Babyscripts, Bps have been normal, last check was 12/8 with 131/83.  Requesting early return to work to help pay bills    Review of Systems:   Pertinent items are noted in HPI  Pertinent History Reviewed:  Reviewed past medical,surgical, social and family history.  Reviewed problem list, medications and allergies.  Objective:   Vitals:   03/31/24 1135 03/31/24 1137  BP: (!) 148/96 (!) 149/81  Pulse: 66 71  Weight: 248 lb (112.5 kg)    Physical Examination:   General appearance - well appearing, and in no distress  Mental status - alert, oriented to person, place, and time  Psych:  normal mood and affect  Skin - warm and dry, normal color, no suspicious lesions    Assessment and Plan:  1. Postpartum hypertension (Primary) Patient with significant prenatal course and hypertension with superimposed preeclampsia. Her BP today is mildly elevated. She is taking amlodipine  as prescribed. Recommend that we add labetalol  100 mg BID and she return for BP check in 3 days. If improved, can consider return to work given her extenuating social circumstances. She reports she works at home from a computer and does not find it stressful. She was disappointed/frustrated that I did not clear her for work today however I expressed my concerns over her blood pressure and for her well being. We reviewed that she is still high risk for blood pressure complications in this early postpartum period. I also gave her the number of  our maternity case manager who will also reach out to her later today - labetalol  (NORMODYNE ) 100 MG tablet; Take 1 tablet (100 mg total) by mouth 2 (two) times daily.  Dispense: 60 tablet; Refill: 0   Return in 3 days (on 04/03/2024).  Future Appointments  Date Time Provider Department Center  04/21/2024  9:15 AM Javell Blackburn, Rollo DASEN, MD [facility] None    Rollo DASEN Bring, MD, FACOG Obstetrician & Gynecologist, [facility] for [facility], [facility]

## 2024-03-31 NOTE — Progress Notes (Signed)
 3 wks. PP presents for work clearance.  BP elevated today.

## 2024-04-03 ENCOUNTER — Ambulatory Visit: Payer: Self-pay

## 2024-04-03 VITALS — BP 133/86 | HR 66 | Wt 247.2 lb

## 2024-04-03 DIAGNOSIS — Z0130 Encounter for examination of blood pressure without abnormal findings: Secondary | ICD-10-CM

## 2024-04-03 NOTE — Progress Notes (Signed)
..  Subjective:  Bailey Carney is a 33 y.o. postpartum female here for BP check. She reports that she is taking Amlodipine  and Labetalol  as prescribed.  Hypertension ROS: taking medications as instructed, no medication side effects noted, no TIA's, no chest pain on exertion, no dyspnea on exertion, and no swelling of ankles.    Objective:  BP 133/86   Pulse 66   Wt 247 lb 3.2 oz (112.1 kg)   LMP 06/22/2023 (Exact Date)   Breastfeeding No   BMI 38.72 kg/m   Appearance alert, well appearing, and in no distress. General exam BP noted to be well controlled today in office.    Assessment:   Blood Pressure well controlled.   Plan:  Current treatment plan is effective, no change in therapy. Dr. Erik is in office and approved pt to return to work on 12/29, per pt request. Advised pt to monitor BP, symptoms, and report to mau if abnormal. Next visit is scheduled for 04/21/24

## 2024-04-21 ENCOUNTER — Ambulatory Visit (INDEPENDENT_AMBULATORY_CARE_PROVIDER_SITE_OTHER): Admitting: Obstetrics and Gynecology

## 2024-04-21 ENCOUNTER — Encounter: Payer: Self-pay | Admitting: Obstetrics and Gynecology

## 2024-04-21 DIAGNOSIS — Z3202 Encounter for pregnancy test, result negative: Secondary | ICD-10-CM | POA: Diagnosis not present

## 2024-04-21 DIAGNOSIS — Z3009 Encounter for other general counseling and advice on contraception: Secondary | ICD-10-CM | POA: Diagnosis not present

## 2024-04-21 DIAGNOSIS — O165 Unspecified maternal hypertension, complicating the puerperium: Secondary | ICD-10-CM | POA: Diagnosis not present

## 2024-04-21 DIAGNOSIS — I10 Essential (primary) hypertension: Secondary | ICD-10-CM

## 2024-04-21 LAB — POCT URINE PREGNANCY: Preg Test, Ur: NEGATIVE

## 2024-04-21 MED ORDER — NORETHINDRONE 0.35 MG PO TABS: 1.0000 | ORAL_TABLET | Freq: Every day | ORAL | 3 refills | Status: AC

## 2024-04-21 MED ORDER — LABETALOL HCL 100 MG PO TABS: 200.0000 mg | ORAL_TABLET | Freq: Two times a day (BID) | ORAL | 0 refills | Status: DC

## 2024-04-21 NOTE — Progress Notes (Addendum)
 "   Post Partum Visit Note  Bailey Carney is a 34 y.o. [phone] female who presents for a postpartum visit. She is 6 weeks postpartum following a normal spontaneous vaginal delivery.  I have fully reviewed the prenatal and intrapartum course. The delivery was at 36 gestational weeks.  Anesthesia: epidural. Postpartum course has been good. Baby is doing well eating. Baby is feeding by bottle - Similac Neosure. Bleeding no bleeding. Bowel function is normal. Bladder function is normal. Patient is sexually active. Contraception method is condoms. Postpartum depression screening: negative.   The pregnancy intention screening data noted above was reviewed. Potential methods of contraception were discussed. The patient elected to proceed with No data recorded.   Edinburgh Postnatal Depression Scale - 04/21/24 0922       Edinburgh Postnatal Depression Scale:  In the Past 7 Days   I have been able to laugh and see the funny side of things. 0    I have looked forward with enjoyment to things. 0    I have blamed myself unnecessarily when things went wrong. 0    I have been anxious or worried for no good reason. 0    I have felt scared or panicky for no good reason. 0    Things have been getting on top of me. 0    I have been so unhappy that I have had difficulty sleeping. 0    I have felt sad or miserable. 0    I have been so unhappy that I have been crying. 0    The thought of harming myself has occurred to me. 0    Edinburgh Postnatal Depression Scale Total 0          Health Maintenance Due  Topic Date Due   Pneumococcal Vaccine (1 of 2 - PCV) Never done   Hepatitis B Vaccines 19-59 Average Risk (1 of 3 - 19+ 3-dose series) Never done   HPV VACCINES (1 - Risk 3-dose SCDM series) Never done   Influenza Vaccine  11/15/2023   COVID-19 Vaccine (1 - 2025-26 season) Never done    The following portions of the patient's history were reviewed and updated as appropriate: allergies, current  medications, past family history, past medical history, past social history, past surgical history, and problem list.  Review of Systems Pertinent items are noted in HPI.  Objective:  BP (!) 142/91 (BP Location: Right Arm, Patient Position: Sitting, Cuff Size: Large)   Pulse 73   Wt 255 lb (115.7 kg)   LMP 06/22/2023 (Exact Date)   BMI 39.94 kg/m   Vitals:   04/21/24 0920 04/21/24 0955  BP: (!) 138/91 (!) 142/91  Pulse: 61 73    General:  alert, cooperative, and no distress   Breasts:  not indicated  Lungs: Nonlabored breathing  Heart:  Regular rate  Abdomen: nontender   Wound N/a  GU exam:  deferred       Assessment:  1. Encounter for postpartum visit (Primary)  2. Hypertension, unspecified type Continue amlodipine  and labetalol  Increase labetalol  to 200 mg BID F/u with PCP for further management   Plan:   Essential components of care per ACOG recommendations:  1.  Mood and well being: Patient with negative depression screening today. Reviewed local resources for support.  - Patient tobacco use? No.   - hx of drug use? No.    2. Infant care and feeding:  -Patient currently breastmilk feeding? No.  -Social determinants of health (SDOH) reviewed in  EPIC. No concerns   3. Sexuality, contraception and birth spacing - Patient does not want a pregnancy in the next year.    - Reviewed reproductive life planning. Reviewed contraceptive methods based on pt preferences and effectiveness.  Patient desired Oral Contraceptive today.   - Discussed birth spacing of 18 months  4. Sleep and fatigue -Encouraged family/partner/community support of 4 hrs of uninterrupted sleep to help with mood and fatigue  5. Physical Recovery  - Discussed patients delivery and complications. She describes her labor as good. - Patient had a Vaginal, no problems at delivery. Patient had no laceration.   Patient expressed understanding - Patient has urinary incontinence? No. - Patient is safe  to resume physical and sexual activity  6.  Health Maintenance - HM due items addressed Yes - Last pap smear  Diagnosis  Date Value Ref Range Status  01/09/2023   Final   - Negative for intraepithelial lesion or malignancy (NILM)   Pap smear not done at today's visit.  -Breast Cancer screening indicated? No.   7. Chronic Disease/Pregnancy Condition follow up: Hypertension  - PCP follow up  Rollo ONEIDA Bring, MD Center for [facility], [facility]  "

## 2024-04-21 NOTE — Addendum Note (Signed)
 Addended by: Ronell Duffus J on: 04/21/2024 11:32 AM   Modules accepted: Orders

## 2024-04-23 ENCOUNTER — Other Ambulatory Visit: Payer: Self-pay | Admitting: Obstetrics & Gynecology

## 2024-04-23 DIAGNOSIS — Z3480 Encounter for supervision of other normal pregnancy, unspecified trimester: Secondary | ICD-10-CM

## 2024-04-27 ENCOUNTER — Other Ambulatory Visit (HOSPITAL_COMMUNITY): Payer: Self-pay

## 2024-05-21 ENCOUNTER — Other Ambulatory Visit: Payer: Self-pay | Admitting: Obstetrics and Gynecology

## 2024-05-21 DIAGNOSIS — O165 Unspecified maternal hypertension, complicating the puerperium: Secondary | ICD-10-CM
